# Patient Record
Sex: Female | Born: 1960
Health system: Southern US, Community
[De-identification: ages and names within clinical notes are randomized; demographics above are authoritative.]

## PROBLEM LIST (undated history)

## (undated) DIAGNOSIS — K8020 Calculus of gallbladder without cholecystitis without obstruction: Secondary | ICD-10-CM

## (undated) DIAGNOSIS — K909 Intestinal malabsorption, unspecified: Secondary | ICD-10-CM

## (undated) DIAGNOSIS — O2230 Deep phlebothrombosis in pregnancy, unspecified trimester: Secondary | ICD-10-CM

## (undated) DIAGNOSIS — F988 Other specified behavioral and emotional disorders with onset usually occurring in childhood and adolescence: Secondary | ICD-10-CM

## (undated) DIAGNOSIS — I2699 Other pulmonary embolism without acute cor pulmonale: Secondary | ICD-10-CM

## (undated) DIAGNOSIS — D649 Anemia, unspecified: Secondary | ICD-10-CM

## (undated) DIAGNOSIS — Z973 Presence of spectacles and contact lenses: Secondary | ICD-10-CM

## (undated) DIAGNOSIS — F419 Anxiety disorder, unspecified: Secondary | ICD-10-CM

## (undated) DIAGNOSIS — F329 Major depressive disorder, single episode, unspecified: Secondary | ICD-10-CM

## (undated) DIAGNOSIS — D509 Iron deficiency anemia, unspecified: Secondary | ICD-10-CM

## (undated) DIAGNOSIS — K219 Gastro-esophageal reflux disease without esophagitis: Secondary | ICD-10-CM

## (undated) DIAGNOSIS — E119 Type 2 diabetes mellitus without complications: Secondary | ICD-10-CM

## (undated) DIAGNOSIS — F32A Depression, unspecified: Secondary | ICD-10-CM

## (undated) HISTORY — DX: Intestinal malabsorption, unspecified: K90.9

## (undated) HISTORY — PX: TONSILLECTOMY: SUR1361

## (undated) HISTORY — DX: Iron deficiency anemia, unspecified: D50.9

## (undated) HISTORY — PX: ESOPHAGOGASTRODUODENOSCOPY: SHX1529

## (undated) HISTORY — PX: MOUTH SURGERY: SHX715

## (undated) HISTORY — PX: COLONOSCOPY: SHX174

---

## 2013-08-13 ENCOUNTER — Encounter (HOSPITAL_COMMUNITY): Payer: Self-pay | Admitting: Emergency Medicine

## 2013-08-13 ENCOUNTER — Emergency Department (HOSPITAL_COMMUNITY)
Admission: EM | Admit: 2013-08-13 | Discharge: 2013-08-13 | Disposition: A | Discharge status: Home / Self Care | Payer: Federal, State, Local not specified - PPO | Attending: Emergency Medicine | Admitting: Emergency Medicine

## 2013-08-13 ENCOUNTER — Emergency Department (HOSPITAL_COMMUNITY): Payer: Federal, State, Local not specified - PPO

## 2013-08-13 DIAGNOSIS — Z79899 Other long term (current) drug therapy: Secondary | ICD-10-CM | POA: Insufficient documentation

## 2013-08-13 DIAGNOSIS — E119 Type 2 diabetes mellitus without complications: Secondary | ICD-10-CM | POA: Insufficient documentation

## 2013-08-13 DIAGNOSIS — F329 Major depressive disorder, single episode, unspecified: Secondary | ICD-10-CM | POA: Insufficient documentation

## 2013-08-13 DIAGNOSIS — F988 Other specified behavioral and emotional disorders with onset usually occurring in childhood and adolescence: Secondary | ICD-10-CM | POA: Insufficient documentation

## 2013-08-13 DIAGNOSIS — Z86711 Personal history of pulmonary embolism: Secondary | ICD-10-CM | POA: Insufficient documentation

## 2013-08-13 DIAGNOSIS — F3289 Other specified depressive episodes: Secondary | ICD-10-CM | POA: Insufficient documentation

## 2013-08-13 DIAGNOSIS — D649 Anemia, unspecified: Secondary | ICD-10-CM

## 2013-08-13 DIAGNOSIS — R0602 Shortness of breath: Secondary | ICD-10-CM | POA: Diagnosis present

## 2013-08-13 HISTORY — DX: Other pulmonary embolism without acute cor pulmonale: I26.99

## 2013-08-13 HISTORY — DX: Other specified behavioral and emotional disorders with onset usually occurring in childhood and adolescence: F98.8

## 2013-08-13 HISTORY — DX: Anemia, unspecified: D64.9

## 2013-08-13 HISTORY — DX: Major depressive disorder, single episode, unspecified: F32.9

## 2013-08-13 HISTORY — DX: Depression, unspecified: F32.A

## 2013-08-13 HISTORY — DX: Type 2 diabetes mellitus without complications: E11.9

## 2013-08-13 LAB — COMPREHENSIVE METABOLIC PANEL
ALT: 49 U/L — ABNORMAL HIGH (ref 0–35)
AST: 30 U/L (ref 0–37)
Albumin: 3.8 g/dL (ref 3.5–5.2)
Alkaline Phosphatase: 143 U/L — ABNORMAL HIGH (ref 39–117)
BUN: 19 mg/dL (ref 6–23)
CO2: 24 mEq/L (ref 19–32)
Calcium: 9.3 mg/dL (ref 8.4–10.5)
Chloride: 98 mEq/L (ref 96–112)
Creatinine, Ser: 1.06 mg/dL (ref 0.50–1.10)
GFR calc Af Amer: 69 mL/min — ABNORMAL LOW (ref >90)
GFR calc non Af Amer: 60 mL/min — ABNORMAL LOW (ref >90)
Glucose, Bld: 158 mg/dL — ABNORMAL HIGH (ref 70–99)
Potassium: 3.7 mEq/L (ref 3.7–5.3)
Sodium: 137 mEq/L (ref 137–147)
Total Bilirubin: 0.2 mg/dL — ABNORMAL LOW (ref 0.3–1.2)
Total Protein: 8 g/dL (ref 6.0–8.3)

## 2013-08-13 LAB — TYPE AND SCREEN
ABO/RH(D): O POS
Antibody Screen: NEGATIVE

## 2013-08-13 LAB — PRO B NATRIURETIC PEPTIDE: Pro B Natriuretic peptide (BNP): 31.9 pg/mL (ref 0–125)

## 2013-08-13 LAB — CBC WITH DIFFERENTIAL/PLATELET
Basophils Absolute: 0.1 10*3/uL (ref 0.0–0.1)
Basophils Relative: 1 % (ref 0–1)
Eosinophils Absolute: 0.1 10*3/uL (ref 0.0–0.7)
Eosinophils Relative: 1 % (ref 0–5)
HCT: 29.4 % — ABNORMAL LOW (ref 36.0–46.0)
Hemoglobin: 8.3 g/dL — ABNORMAL LOW (ref 12.0–15.0)
Lymphocytes Relative: 26 % (ref 12–46)
Lymphs Abs: 2.9 10*3/uL (ref 0.7–4.0)
MCH: 18.1 pg — ABNORMAL LOW (ref 26.0–34.0)
MCHC: 28.2 g/dL — ABNORMAL LOW (ref 30.0–36.0)
MCV: 64.2 fL — ABNORMAL LOW (ref 78.0–100.0)
Monocytes Absolute: 0.8 10*3/uL (ref 0.1–1.0)
Monocytes Relative: 7 % (ref 3–12)
Neutro Abs: 7.2 10*3/uL (ref 1.7–7.7)
Neutrophils Relative %: 65 % (ref 43–77)
Platelets: 475 10*3/uL — ABNORMAL HIGH (ref 150–400)
RBC: 4.58 MIL/uL (ref 3.87–5.11)
RDW: 19.6 % — ABNORMAL HIGH (ref 11.5–15.5)
WBC: 11.1 10*3/uL — ABNORMAL HIGH (ref 4.0–10.5)

## 2013-08-13 LAB — ABO/RH: ABO/RH(D): O POS

## 2013-08-13 MED ORDER — SODIUM CHLORIDE 0.9 % IV BOLUS (SEPSIS)
500.0000 mL | Freq: Once | INTRAVENOUS | Status: AC
  Administered 2013-08-13: 500 mL via INTRAVENOUS

## 2013-08-13 NOTE — ED Notes (Signed)
Pt states she has chronic anemia and felt it dropping last month  Pt was seen at [facility] and had a CT which showed multiple PEs  Pt was treated for that and was discharged yesterday  Pt states today she has been dizzy and very short of breath  Pt states she has had blood transfusions and iron transfusions in the past  Pt states she thinks her hgb was 7.8 when she was in the hospital

## 2013-08-13 NOTE — Discharge Instructions (Signed)

## 2013-08-13 NOTE — ED Provider Notes (Signed)
CSN: [id number]     Arrival date & time 08/13/13  2002 History   First MD Initiated Contact with Patient 08/13/13 2039     Chief Complaint  Patient presents with  . Shortness of Breath     (Consider location/radiation/quality/duration/timing/severity/associated sxs/prior Treatment) Patient is a 53 y.o. female presenting with shortness of breath. The history is provided by the patient.  Shortness of Breath Severity:  Mild Onset quality:  Gradual Duration:  2 days Timing:  Constant Progression:  Unchanged Chronicity:  New Context: activity   Relieved by:  Rest Worsened by:  Activity Associated symptoms: no abdominal pain, no chest pain, no cough, no fever, no headaches, no neck pain and no vomiting     Past Medical History  Diagnosis Date  . Anemia   . Pulmonary emboli   . ADD (attention deficit disorder)   . Depression   . Diabetes mellitus without complication    Past Surgical History  Procedure Laterality Date  . Mouth surgery     Family History  Problem Relation Age of Onset  . CAD Father   . Hypertension Other   . Diabetes Other   . Stroke Other   . Cancer Other    History  Substance Use Topics  . Smoking status: Never Smoker   . Smokeless tobacco: Not on file  . Alcohol Use: Yes     Comment: rare   OB History   Grav Para Term Preterm Abortions TAB SAB Ect Mult Living                 Review of Systems  Constitutional: Negative for fever and fatigue.  HENT: Negative for congestion and drooling.   Eyes: Negative for pain.  Respiratory: Positive for shortness of breath. Negative for cough.   Cardiovascular: Negative for chest pain.  Gastrointestinal: Negative for nausea, vomiting, abdominal pain and diarrhea.  Genitourinary: Negative for dysuria and hematuria.  Musculoskeletal: Negative for back pain, gait problem and neck pain.  Skin: Negative for color change.  Neurological: Negative for dizziness and headaches.  Hematological: Negative for  adenopathy.  Psychiatric/Behavioral: Negative for behavioral problems.  All other systems reviewed and are negative.      Allergies  Review of patient's allergies indicates no known allergies.  Home Medications   Current Outpatient Rx  Name  Route  Sig  Dispense  Refill  . clonazePAM (KLONOPIN) 1 MG tablet   Oral   Take 1 mg by mouth at bedtime as needed for anxiety.         . furosemide (LASIX) 20 MG tablet   Oral   Take 20 mg by mouth 2 (two) times daily.         Marland Kitchen glucosamine-chondroitin 500-400 MG tablet   Oral   Take 1 tablet by mouth 2 (two) times daily.         Marland Kitchen lisdexamfetamine (VYVANSE) 70 MG capsule   Oral   Take 70 mg by mouth daily.         . Multiple Vitamin (MULTIVITAMIN WITH MINERALS) TABS tablet   Oral   Take 1 tablet by mouth daily.         . potassium chloride (K-DUR) 10 MEQ tablet   Oral   Take 10 mEq by mouth daily.         . Rivaroxaban (XARELTO) 15 MG TABS tablet   Oral   Take 15 mg by mouth 2 (two) times daily with a meal.         .  venlafaxine XR (EFFEXOR-XR) 150 MG 24 hr capsule   Oral   Take 150 mg by mouth daily with breakfast.          BP 146/91  Pulse 128  Resp 20  SpO2 99%  LMP 05/21/2013 Physical Exam  Nursing note and vitals reviewed. Constitutional: She is oriented to person, place, and time. She appears well-developed and well-nourished.  HENT:  Head: Normocephalic.  Mouth/Throat: Oropharynx is clear and moist. No oropharyngeal exudate.  Eyes: Conjunctivae and EOM are normal. Pupils are equal, round, and reactive to light.  Neck: Normal range of motion. Neck supple.  Cardiovascular: Regular rhythm, normal heart sounds and intact distal pulses.  Exam reveals no gallop and no friction rub.   No murmur heard. HR 101  Pulmonary/Chest: Effort normal and breath sounds normal. No respiratory distress. She has no wheezes.  Abdominal: Soft. Bowel sounds are normal. There is no tenderness. There is no rebound  and no guarding.  Musculoskeletal: Normal range of motion. She exhibits no edema and no tenderness.  Neurological: She is alert and oriented to person, place, and time.  Skin: Skin is warm and dry.  Psychiatric: She has a normal mood and affect. Her behavior is normal.    ED Course  Procedures (including critical care time) Labs Review Labs Reviewed  CBC WITH DIFFERENTIAL - Abnormal; Notable for the following:    WBC 11.1 (*)    Hemoglobin 8.3 (*)    HCT 29.4 (*)    MCV 64.2 (*)    MCH 18.1 (*)    MCHC 28.2 (*)    RDW 19.6 (*)    Platelets 475 (*)    All other components within normal limits  COMPREHENSIVE METABOLIC PANEL - Abnormal; Notable for the following:    Glucose, Bld 158 (*)    ALT 49 (*)    Alkaline Phosphatase 143 (*)    Total Bilirubin 0.2 (*)    GFR calc non Af Amer 60 (*)    GFR calc Af Amer 69 (*)    All other components within normal limits  PRO B NATRIURETIC PEPTIDE  PRO B NATRIURETIC PEPTIDE  TYPE AND SCREEN  ABO/RH   Imaging Review Dg Chest 2 View  08/13/2013   CLINICAL DATA:  Shortness of breath.  EXAM: CHEST  2 VIEW  COMPARISON:  No priors.  FINDINGS: Lung volumes are normal. No consolidative airspace disease. No pleural effusions. No evidence of pulmonary edema. Heart size is upper limits of normal. Upper mediastinal contours are unremarkable. Large hiatal hernia with air-fluid level noted.  IMPRESSION: 1. No radiographic evidence of acute cardiopulmonary disease. 2. Large hiatal hernia.   Electronically Signed   By: Vinnie Langton M.D.   On: 08/13/2013 21:40     EKG Interpretation   Date/Time:  Thursday August 13 2013 21:00:02 EDT Ventricular Rate:  98 PR Interval:  147 QRS Duration: 89 QT Interval:  369 QTC Calculation: 471 R Axis:   15 Text Interpretation:  Sinus rhythm Consider left atrial enlargement no  previous for comparison Confirmed by Brittaney Beaulieu  MD, [employer] ([id number]) on  08/13/2013 9:26:32 PM      MDM   Final diagnoses:  SOB  (shortness of breath)  Chronic anemia    8:54 PM 53 y.o. female w hx of anemia, recent dx of PE (d/c yesterday) on xarelto, DM who presents with shortness of breath with exertion. She notes that since her discharge she has had increased shortness of breath with exertion and also some  mild dizziness. She denies any chest pain. She is afebrile and mildly tachycardic here. Triage vitals show a heart rate of 128 but her heart rate on my exam is 101. She notes that she has a history of anemia and has required a blood transfusion once previously. She states that her hemoglobin upon discharge from [facility] was 7.8. She is concerned that she has become anemic and this is the reason for her shortness of breath. Will get screening lab work and chest x-ray. I suspect that her newly diagnosed PE is the cause of her shortness of breath. She has not missed her xarelto since d/c.   10:54 PM: Pt continues to appear well. No inc wob on exam, pt is 100% on RA, no cp. Hgb is 8.3 and other labs/imagnig non-contrib.  I have discussed the diagnosis/risks/treatment options with the patient and believe the pt to be eligible for discharge home to follow-up with her pcp next week. We also discussed returning to the ED immediately if new or worsening sx occur. We discussed the sx which are most concerning (e.g., cp, worsening sob, fever) that necessitate immediate return. Medications administered to the patient during their visit and any new prescriptions provided to the patient are listed below.  Medications given during this visit Medications  sodium chloride 0.9 % bolus 500 mL (500 mLs Intravenous New Bag/Given 08/13/13 2126)    New Prescriptions   No medications on file     Blanchard Kelch, MD 08/13/13 2259

## 2013-08-13 NOTE — ED Notes (Signed)
Patient is alert and oriented x3.  She was given DC instructions and follow up visit instructions.  Patient gave verbal understanding. She was DC ambulatory under her own power to home.  V/S stable.  He was not showing any signs of distress on DC 

## 2013-08-13 NOTE — Progress Notes (Signed)
   CARE MANAGEMENT ED NOTE 08/13/2013  Patient:  Samantha Maynard, Samantha Maynard   Account Number:  [id number]  Date Initiated:  08/13/2013  Documentation initiated by:  Livia Snellen  Subjective/Objective Assessment:   Patient presents to Ed with shortness of breath.     Subjective/Objective Assessment Detail:   Patient with pmhx of anemia, PE, and DM     Action/Plan:   Action/Plan Detail:   Anticipated DC Date:       Status Recommendation to Physician:   Result of Recommendation:    Other ED [place]  Other  PCP issues    Choice offered to / List presented to:            Status of service:  Completed, signed off  ED Comments:   ED Comments Detail:  Patient reports her pcp is at the [facility] at [facility].  Patient cannot recall the name of her physician.  System updated.

## 2013-08-21 ENCOUNTER — Other Ambulatory Visit (HOSPITAL_COMMUNITY): Payer: Self-pay

## 2013-08-27 ENCOUNTER — Encounter (HOSPITAL_COMMUNITY): Payer: Self-pay | Admitting: Internal Medicine

## 2013-09-18 ENCOUNTER — Other Ambulatory Visit: Payer: Self-pay | Admitting: Obstetrics and Gynecology

## 2013-09-18 ENCOUNTER — Other Ambulatory Visit (HOSPITAL_COMMUNITY)
Admission: RE | Admit: 2013-09-18 | Discharge: 2013-09-18 | Disposition: A | Discharge status: Home / Self Care | Payer: Federal, State, Local not specified - PPO | Source: Ambulatory Visit | Attending: Obstetrics and Gynecology | Admitting: Obstetrics and Gynecology

## 2013-09-18 DIAGNOSIS — Z1151 Encounter for screening for human papillomavirus (HPV): Secondary | ICD-10-CM | POA: Insufficient documentation

## 2013-09-18 DIAGNOSIS — Z01419 Encounter for gynecological examination (general) (routine) without abnormal findings: Secondary | ICD-10-CM | POA: Insufficient documentation

## 2013-09-30 ENCOUNTER — Telehealth: Payer: Self-pay | Admitting: Hematology & Oncology

## 2013-09-30 NOTE — Telephone Encounter (Signed)
I spoke w NEW PATIENT today to remind them of their appointment with Dr. Ennever. Also, advised them to bring all medication bottles and insurance card information. ° °

## 2013-10-02 ENCOUNTER — Ambulatory Visit: Payer: Federal, State, Local not specified - PPO

## 2013-10-02 ENCOUNTER — Ambulatory Visit: Payer: Federal, State, Local not specified - PPO | Admitting: Hematology & Oncology

## 2013-10-02 ENCOUNTER — Telehealth: Payer: Self-pay | Admitting: Hematology & Oncology

## 2013-10-02 ENCOUNTER — Other Ambulatory Visit: Payer: Federal, State, Local not specified - PPO | Admitting: Lab

## 2013-10-02 NOTE — Telephone Encounter (Signed)
Pt called said she went to [facility]. I had given her all the details of the appointment, she said she went out of town and her daughter cleaned the kitchen so she went to the wrong location. She rescheduled for 6-18. She said she would call back for the address etc.

## 2013-11-04 ENCOUNTER — Telehealth: Payer: Self-pay | Admitting: Hematology & Oncology

## 2013-11-04 NOTE — Telephone Encounter (Signed)
Left vm w NEW PATIENT today to remind them of their appointment with Dr. Ennever. Also, advised them to bring all medication bottles and insurance card information. ° °

## 2013-11-05 ENCOUNTER — Ambulatory Visit (HOSPITAL_BASED_OUTPATIENT_CLINIC_OR_DEPARTMENT_OTHER): Payer: Federal, State, Local not specified - PPO

## 2013-11-05 ENCOUNTER — Telehealth: Payer: Self-pay | Admitting: Hematology & Oncology

## 2013-11-05 ENCOUNTER — Other Ambulatory Visit (HOSPITAL_BASED_OUTPATIENT_CLINIC_OR_DEPARTMENT_OTHER): Payer: Federal, State, Local not specified - PPO | Admitting: Lab

## 2013-11-05 ENCOUNTER — Ambulatory Visit: Payer: Federal, State, Local not specified - PPO

## 2013-11-05 ENCOUNTER — Ambulatory Visit (HOSPITAL_BASED_OUTPATIENT_CLINIC_OR_DEPARTMENT_OTHER): Payer: Federal, State, Local not specified - PPO | Admitting: Hematology & Oncology

## 2013-11-05 ENCOUNTER — Encounter: Payer: Self-pay | Admitting: Hematology & Oncology

## 2013-11-05 VITALS — BP 144/82 | HR 91 | Temp 98.3°F | Resp 14 | Ht 63.0 in | Wt 217.0 lb

## 2013-11-05 DIAGNOSIS — K909 Intestinal malabsorption, unspecified: Secondary | ICD-10-CM | POA: Insufficient documentation

## 2013-11-05 DIAGNOSIS — I2699 Other pulmonary embolism without acute cor pulmonale: Secondary | ICD-10-CM

## 2013-11-05 DIAGNOSIS — D509 Iron deficiency anemia, unspecified: Secondary | ICD-10-CM

## 2013-11-05 HISTORY — DX: Intestinal malabsorption, unspecified: K90.9

## 2013-11-05 HISTORY — DX: Iron deficiency anemia, unspecified: D50.9

## 2013-11-05 LAB — IRON AND TIBC CHCC
%SAT: 4 % — ABNORMAL LOW (ref 21–57)
Iron: 15 ug/dL — ABNORMAL LOW (ref 41–142)
TIBC: 353 ug/dL (ref 236–444)
UIBC: 338 ug/dL (ref 120–384)

## 2013-11-05 LAB — CBC WITH DIFFERENTIAL (CANCER CENTER ONLY)
BASO#: 0 10*3/uL (ref 0.0–0.2)
BASO%: 0.2 % (ref 0.0–2.0)
EOS%: 0 % (ref 0.0–7.0)
Eosinophils Absolute: 0 10*3/uL (ref 0.0–0.5)
HCT: 33.3 % — ABNORMAL LOW (ref 34.8–46.6)
HGB: 9.8 g/dL — ABNORMAL LOW (ref 11.6–15.9)
LYMPH#: 1.7 10*3/uL (ref 0.9–3.3)
LYMPH%: 25.9 % (ref 14.0–48.0)
MCH: 20.2 pg — ABNORMAL LOW (ref 26.0–34.0)
MCHC: 29.4 g/dL — ABNORMAL LOW (ref 32.0–36.0)
MCV: 69 fL — ABNORMAL LOW (ref 81–101)
MONO#: 0.4 10*3/uL (ref 0.1–0.9)
MONO%: 6.8 % (ref 0.0–13.0)
NEUT#: 4.4 10*3/uL (ref 1.5–6.5)
NEUT%: 67.1 % (ref 39.6–80.0)
Platelets: 480 10*3/uL — ABNORMAL HIGH (ref 145–400)
RBC: 4.86 10*6/uL (ref 3.70–5.32)
RDW: 22.4 % — ABNORMAL HIGH (ref 11.1–15.7)
WBC: 6.5 10*3/uL (ref 3.9–10.0)

## 2013-11-05 LAB — TECHNOLOGIST REVIEW CHCC SATELLITE

## 2013-11-05 LAB — FERRITIN CHCC: Ferritin: <4 ng/ml — ABNORMAL LOW (ref 9–269)

## 2013-11-05 LAB — CHCC SATELLITE - SMEAR

## 2013-11-05 MED ORDER — SODIUM CHLORIDE 0.9 % IV SOLN
1020.0000 mg | Freq: Once | INTRAVENOUS | Status: AC
  Administered 2013-11-05: 1020 mg via INTRAVENOUS
  Filled 2013-11-05: qty 34

## 2013-11-05 MED ORDER — SODIUM CHLORIDE 0.9 % IV SOLN
Freq: Once | INTRAVENOUS | Status: AC
  Administered 2013-11-05: 12:00:00 via INTRAVENOUS

## 2013-11-05 NOTE — Telephone Encounter (Signed)
[employer] - NPR   [id number] PR NORMAL SALINE SOLUTION INFUS  [id number] PR FERUMOXYTOL, NON-ESRD  [id number] PR DRUGS UNCLASSIFIED INJECTION  Anemia, iron deficiency - Primary 280.9 Malabsorption of iron

## 2013-11-05 NOTE — Patient Instructions (Signed)

## 2013-11-06 ENCOUNTER — Telehealth: Payer: Self-pay | Admitting: *Deleted

## 2013-11-06 NOTE — Progress Notes (Signed)
Referral MD  Reason for Referral: Iron deficiency anemia                                    Recurrent pulmonary emboli   Chief Complaint  Patient presents with  . NEW PATIENT  : Iron is low  HPI: Samantha Maynard is a very charming 53 year old white female. 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 She has been feeling very tired. She just does not have a lot of energy.  She has a history of pulmonary emboli. She had an episode a couple years ago. She is on anticoagulation for a year. She then developed a second episode. This just happened spontaneously. She is on Xarelto and doing well. There's been no bleeding.  She has had blood work done. She was found to be quite anemic. Back in April, her hemoglobin was 8.1. MCV was 63. Her doctor felt that we needed to see her to help with her feeling better.  She's had no pain. She's had no nausea or vomiting. Oral iron is tough to take for her.  There's been no change in bowel or bladder habits. She has had colonoscopy and upper endoscopy. She has had her mammograms.    Past Medical History  Diagnosis Date  . Anemia   . Pulmonary emboli   . ADD (attention deficit disorder)   . Depression   . Diabetes mellitus without complication   . Anemia, iron deficiency 11/05/2013  . Malabsorption of iron 11/05/2013  :  Past Surgical History  Procedure Laterality Date  . Mouth surgery    :  Current outpatient prescriptions:clonazePAM (KLONOPIN) 1 MG tablet, Take 1 mg by mouth at bedtime as needed for anxiety., Disp: , Rfl: ;  furosemide (LASIX) 20 MG tablet, Take 20 mg by mouth daily. , Disp: , Rfl: ;  glucosamine-chondroitin 500-400 MG tablet, Take 1 tablet by mouth 2 (two) times daily., Disp: , Rfl: ;  Iron-Vitamin C (IRON 100/C PO), Take by mouth every morning., Disp: , Rfl:  lisdexamfetamine (VYVANSE) 70 MG capsule, Take 70 mg by mouth daily., Disp: , Rfl: ;  metFORMIN (GLUCOPHAGE-XR) 500 MG 24 hr tablet, Take 500 mg by mouth 2 (two) times daily., Disp: , Rfl: ;   Multiple Vitamin (MULTIVITAMIN WITH MINERALS) TABS tablet, Take 1 tablet by mouth daily., Disp: , Rfl: ;  potassium chloride (K-DUR) 10 MEQ tablet, Take 10 mEq by mouth daily., Disp: , Rfl:  rivaroxaban (XARELTO) 20 MG TABS tablet, Take 20 mg by mouth daily with supper., Disp: , Rfl: ;  venlafaxine XR (EFFEXOR-XR) 150 MG 24 hr capsule, Take 150 mg by mouth daily with breakfast., Disp: , Rfl: :  :  No Known Allergies:  Family History  Problem Relation Age of Onset  . CAD Father   . Hypertension Other   . Diabetes Other   . Stroke Other   . Cancer Other   :  History   Social History  . Marital Status: Married    Spouse Name: N/A    Number of Children: N/A  . Years of Education: N/A   Occupational History  . Not on file.   Social History Main Topics  . Smoking status: Never Smoker   . Smokeless tobacco: Never Used     Comment: never used tobacco  . Alcohol Use: Yes     Comment: rare  . Drug Use: No  . Sexual Activity: Not on  file   Other Topics Concern  . Not on file   Social History Narrative  . No narrative on file  :  Pertinent items are noted in HPI.  Exam: @IPVITALS @  well-developed and well-nourished white female in no obvious distress. Vital signs show a temperature of 98 3. Pulse 91. Blood pressure 144/82. Weight is 217 pounds. Head and neck exam shows no ocular or oral lesions. She has pale conjunctiva. There is no adenopathy in the neck. Lungs are clear. Cardiac exam regular in rhythm with no murmurs rubs or bruits. Abdomen is soft. Has good bowel sounds. There is no fluid wave. There is no palpable liver or spleen. Extremities shows no clubbing cyanosis or edema. Skin exam no rashes. Neurological exam is nonfocal.    Recent Labs  11/05/13 1010  WBC 6.5  HGB 9.8*  HCT 33.3*  PLT 480*   No results found for this basename: NA, K, CL, CO2, GLUCOSE, BUN, CREATININE, CALCIUM,  in the last 72 hours  Blood smear review: Microcytic red cells. There is some  anisocytosis. There are no target cells. I see no rouleau formation. There are no schistocytes. White cells per normal in morphology maturation. There are no immature myeloid cells. There are no hypersegmented polys. I see no atypical what the size. Platelets are increased in number. Platelets are small. Platelets are well granulated.  Pathology: No data     Assessment and Plan: Samantha Maynard is a very charming 53 year old white female. She has iron deficiency. She has a history of recurrent thromboembolic events. She is a lifelong anticoagulation.  Of note, there are studies showed ferritin less than 4. Her iron saturation is 4%. Total iron is only 15.  She is clearly iron deficient. We will go ahead and give her IV iron today. I'm sure that this will help.  I'm sure she is not absorbing well. There may be some GI bleeding. She is on anticoagulation but there has been no obvious melena or bright red blood per rectum.  We will plan to get her back to see [place] in about 4 weeks or so. I suspect that her hemoglobin will improve nicely. I also suspect that her MCV will start to come up in her platelets will go down.  I don't see any for a bone marrow test on her.  It was fun talking to her. 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 She did this while she was up in [place].

## 2013-11-06 NOTE — Telephone Encounter (Addendum)
Message copied by Lenn Sink on Fri Nov 06, 2013  9:33 AM ------      Message from: Burney Gauze R      Created: Thu Nov 05, 2013  7:10 PM       acall - iron is very low!!  i am glad you got the iron already!!  Hopefully it will make you feel better!!  pete ------Informed pt that iron was low and its good that she received iron yesterday. Hopefully she will start feeling better.

## 2013-11-10 LAB — HYPERCOAGULABLE PANEL, COMPREHENSIVE
AntiThromb III Func: 114 % (ref 76–126)
Anticardiolipin IgA: 11 APL U/mL (ref <22)
Anticardiolipin IgG: 9 GPL U/mL (ref <23)
Anticardiolipin IgM: 7 MPL U/mL (ref <11)
Beta-2 Glyco I IgG: 0 G Units (ref <20)
Beta-2-Glycoprotein I IgA: 16 A Units (ref <20)
Beta-2-Glycoprotein I IgM: 3 M Units (ref <20)
DRVVT 1:1 Mix: 41.6 secs (ref <42.9)
DRVVT: 49.9 secs — ABNORMAL HIGH (ref <42.9)
Lupus Anticoagulant: NOT DETECTED
PTT Lupus Anticoagulant: 39.5 secs (ref 28.0–43.0)
Protein C Activity: 176 % — ABNORMAL HIGH (ref 75–133)
Protein C, Total: 93 % (ref 72–160)
Protein S Activity: 128 % (ref 69–129)
Protein S Total: 99 % (ref 60–150)

## 2013-11-10 LAB — RETICULOCYTES (CHCC)
ABS Retic: 72.6 10*3/uL (ref 19.0–186.0)
RBC.: 4.84 MIL/uL (ref 3.87–5.11)
Retic Ct Pct: 1.5 % (ref 0.4–2.3)

## 2013-11-12 ENCOUNTER — Telehealth: Payer: Self-pay | Admitting: *Deleted

## 2013-11-12 NOTE — Telephone Encounter (Signed)
Message copied by Rico Ala on Thu Nov 12, 2013 10:50 AM ------      Message from: Burney Gauze R      Created: Wed Nov 11, 2013  6:38 AM       Call - all blood clotting studies are normal!!  pete ------

## 2013-11-12 NOTE — Telephone Encounter (Signed)
Called patient and left message on personal cell phone that her blood clotting studies were all normal per dr. Marin Olp

## 2013-12-10 ENCOUNTER — Ambulatory Visit (HOSPITAL_BASED_OUTPATIENT_CLINIC_OR_DEPARTMENT_OTHER): Payer: Federal, State, Local not specified - PPO | Admitting: Hematology & Oncology

## 2013-12-10 ENCOUNTER — Other Ambulatory Visit (HOSPITAL_BASED_OUTPATIENT_CLINIC_OR_DEPARTMENT_OTHER): Payer: Federal, State, Local not specified - PPO | Admitting: Lab

## 2013-12-10 VITALS — BP 139/105 | HR 90 | Temp 97.1°F | Resp 16

## 2013-12-10 DIAGNOSIS — D509 Iron deficiency anemia, unspecified: Secondary | ICD-10-CM

## 2013-12-10 DIAGNOSIS — I2699 Other pulmonary embolism without acute cor pulmonale: Secondary | ICD-10-CM

## 2013-12-10 DIAGNOSIS — K909 Intestinal malabsorption, unspecified: Secondary | ICD-10-CM

## 2013-12-10 LAB — CBC WITH DIFFERENTIAL (CANCER CENTER ONLY)
BASO#: 0 10*3/uL (ref 0.0–0.2)
BASO%: 0.2 % (ref 0.0–2.0)
EOS%: 0 % (ref 0.0–7.0)
Eosinophils Absolute: 0 10*3/uL (ref 0.0–0.5)
HCT: 43.3 % (ref 34.8–46.6)
HGB: 14.1 g/dL (ref 11.6–15.9)
LYMPH#: 2 10*3/uL (ref 0.9–3.3)
LYMPH%: 31.5 % (ref 14.0–48.0)
MCH: 25.3 pg — ABNORMAL LOW (ref 26.0–34.0)
MCHC: 32.6 g/dL (ref 32.0–36.0)
MCV: 78 fL — ABNORMAL LOW (ref 81–101)
MONO#: 0.5 10*3/uL (ref 0.1–0.9)
MONO%: 7.4 % (ref 0.0–13.0)
NEUT#: 3.8 10*3/uL (ref 1.5–6.5)
NEUT%: 60.9 % (ref 39.6–80.0)
Platelets: 298 10*3/uL (ref 145–400)
RBC: 5.58 10*6/uL — ABNORMAL HIGH (ref 3.70–5.32)
WBC: 6.2 10*3/uL (ref 3.9–10.0)

## 2013-12-10 LAB — RETICULOCYTES (CHCC)
ABS Retic: 28.2 10*3/uL (ref 19.0–186.0)
RBC.: 5.64 MIL/uL — ABNORMAL HIGH (ref 3.87–5.11)
Retic Ct Pct: 0.5 % (ref 0.4–2.3)

## 2013-12-10 LAB — CHCC SATELLITE - SMEAR

## 2013-12-11 ENCOUNTER — Other Ambulatory Visit: Payer: Self-pay | Admitting: *Deleted

## 2013-12-11 ENCOUNTER — Telehealth: Payer: Self-pay | Admitting: *Deleted

## 2013-12-11 DIAGNOSIS — D509 Iron deficiency anemia, unspecified: Secondary | ICD-10-CM

## 2013-12-11 LAB — IRON AND TIBC CHCC
%SAT: 19 % — ABNORMAL LOW (ref 21–57)
Iron: 55 ug/dL (ref 41–142)
TIBC: 285 ug/dL (ref 236–444)
UIBC: 230 ug/dL (ref 120–384)

## 2013-12-11 LAB — FERRITIN CHCC: Ferritin: 44 ng/ml (ref 9–269)

## 2013-12-11 NOTE — Progress Notes (Signed)
Hematology and Oncology Follow Up Visit  Samantha Maynard [id number] [birth year] 53 y.o. 12/11/2013   Principle Diagnosis:   Iron deficiency anemia  Recurrent pulmonary emboli  Current Therapy:   IV iron as indicated 100 patient received a dose of Feraheme in June of 2015     Interim History:  Ms.  Samantha Maynard is back for followup. First saw her back in June. She felt tired. She did not have a lot of energy. She is an oncology nurse.   her ferritin was less than 4. Her iron saturation was only 4%.  She has not had any bleeding. She's had no nausea vomiting.  She got herIV iron. She is feeling better. She is getting ready to to go on vacation down to [place]. She feels ready for vacation now.  She is on Xarelto and doing well.    Medications: Current outpatient prescriptions:clonazePAM (KLONOPIN) 1 MG tablet, Take 1 mg by mouth at bedtime as needed for anxiety., Disp: , Rfl: ;  furosemide (LASIX) 20 MG tablet, Take 20 mg by mouth daily. , Disp: , Rfl: ;  glucosamine-chondroitin 500-400 MG tablet, Take 1 tablet by mouth 2 (two) times daily., Disp: , Rfl: ;  Iron-Vitamin C (IRON 100/C PO), Take by mouth every morning., Disp: , Rfl:  lisdexamfetamine (VYVANSE) 70 MG capsule, Take 70 mg by mouth daily., Disp: , Rfl: ;  metFORMIN (GLUCOPHAGE-XR) 500 MG 24 hr tablet, Take 500 mg by mouth 2 (two) times daily., Disp: , Rfl: ;  Multiple Vitamin (MULTIVITAMIN WITH MINERALS) TABS tablet, Take 1 tablet by mouth daily., Disp: , Rfl: ;  potassium chloride (K-DUR) 10 MEQ tablet, Take 10 mEq by mouth daily., Disp: , Rfl:  rivaroxaban (XARELTO) 20 MG TABS tablet, Take 20 mg by mouth daily with supper., Disp: , Rfl: ;  senna (SENOKOT) 8.6 MG TABS tablet, Take 2 tablets by mouth at bedtime., Disp: , Rfl: ;  venlafaxine XR (EFFEXOR-XR) 150 MG 24 hr capsule, Take 150 mg by mouth daily with breakfast., Disp: , Rfl:   Allergies: No Known Allergies  Past Medical History, Surgical history, Social history, and Family  History were reviewed and updated.  Review of Systems: As above  Physical Exam:  oral temperature is 97.1 F (36.2 C). Her blood pressure is 139/105 and her pulse is 90. Her respiration is 16.   Well-developed and well-nourished white female. Head and neck exam shows no ocular or oral lesions. There are no palpable cervical or supraclavicular lymph nodes. Lungs are clear bilaterally. She has no rales wheezes or rhonchi. Cardiac exam regular in with him with no murmurs rubs or bruits. Abdomen is soft. She has good bowel sounds. There is no fluid wave is no palpable liver or spleen tip. Back exam shows no tenderness over the spine ribs or hips. Extremities shows no clubbing cyanosis or edema. Skin exam no rashes, ecchymosis or petechia.  Lab Results  Component Value Date   WBC 6.2 12/10/2013   HGB 14.1 12/10/2013   HCT 43.3 12/10/2013   MCV 78* 12/10/2013   PLT 298 12/10/2013     Chemistry      Component Value Date/Time   NA 137 08/13/2013 2120   K 3.7 08/13/2013 2120   CL 98 08/13/2013 2120   CO2 24 08/13/2013 2120   BUN 19 08/13/2013 2120   CREATININE 1.06 08/13/2013 2120      Component Value Date/Time   CALCIUM 9.3 08/13/2013 2120   ALKPHOS 143* 08/13/2013 2120   AST 30 08/13/2013  2120   ALT 49* 08/13/2013 2120   BILITOT 0.2* 08/13/2013 2120         Impression and Plan: [name] is 53 year old white female with iron deficiency anemia. She had marked iron deficiency. After one dose of iron, her hemoglobin has gone up almost 5 point. Her MCV is better. We will see what her iron studies show. Even though she is not anemic, she may need another dose of iron just to replete her iron stores.  I think we can probably get her back in about 2 months time now.  I am is happy that she is able to function better and have a better quality of life.  Samantha Napoleon, MD 7/24/20157:10 AM

## 2013-12-11 NOTE — Telephone Encounter (Signed)
Informed pt that Dr Marin Olp reviewed her labs today and stated that she needs another dose of 1020 IV iron in the next week or two.

## 2013-12-15 ENCOUNTER — Ambulatory Visit (HOSPITAL_BASED_OUTPATIENT_CLINIC_OR_DEPARTMENT_OTHER): Payer: Federal, State, Local not specified - PPO

## 2013-12-15 VITALS — BP 117/85 | HR 88 | Temp 96.9°F | Resp 20

## 2013-12-15 DIAGNOSIS — D509 Iron deficiency anemia, unspecified: Secondary | ICD-10-CM

## 2013-12-15 MED ORDER — SODIUM CHLORIDE 0.9 % IV SOLN
1020.0000 mg | Freq: Once | INTRAVENOUS | Status: AC
  Administered 2013-12-15: 1020 mg via INTRAVENOUS
  Filled 2013-12-15: qty 34

## 2013-12-15 MED ORDER — SODIUM CHLORIDE 0.9 % IV SOLN
INTRAVENOUS | Status: DC
  Administered 2013-12-15: 12:00:00 via INTRAVENOUS

## 2013-12-15 NOTE — Patient Instructions (Signed)

## 2014-01-21 ENCOUNTER — Ambulatory Visit (HOSPITAL_BASED_OUTPATIENT_CLINIC_OR_DEPARTMENT_OTHER): Payer: Federal, State, Local not specified - PPO | Admitting: Family

## 2014-01-21 ENCOUNTER — Encounter: Payer: Self-pay | Admitting: Family

## 2014-01-21 ENCOUNTER — Other Ambulatory Visit (HOSPITAL_BASED_OUTPATIENT_CLINIC_OR_DEPARTMENT_OTHER): Payer: Federal, State, Local not specified - PPO | Admitting: Lab

## 2014-01-21 VITALS — BP 151/92 | HR 80 | Temp 97.9°F | Resp 14 | Ht 63.0 in | Wt 198.0 lb

## 2014-01-21 DIAGNOSIS — D509 Iron deficiency anemia, unspecified: Secondary | ICD-10-CM

## 2014-01-21 DIAGNOSIS — K909 Intestinal malabsorption, unspecified: Secondary | ICD-10-CM

## 2014-01-21 LAB — RETICULOCYTES (CHCC)
ABS Retic: 50.6 10*3/uL (ref 19.0–186.0)
RBC.: 5.62 MIL/uL — ABNORMAL HIGH (ref 3.87–5.11)
Retic Ct Pct: 0.9 % (ref 0.4–2.3)

## 2014-01-21 LAB — CBC WITH DIFFERENTIAL (CANCER CENTER ONLY)
BASO#: 0 10*3/uL (ref 0.0–0.2)
BASO%: 0.1 % (ref 0.0–2.0)
EOS%: 0 % (ref 0.0–7.0)
Eosinophils Absolute: 0 10*3/uL (ref 0.0–0.5)
HCT: 46.9 % — ABNORMAL HIGH (ref 34.8–46.6)
HGB: 15.6 g/dL (ref 11.6–15.9)
LYMPH#: 2.4 10*3/uL (ref 0.9–3.3)
LYMPH%: 29.9 % (ref 14.0–48.0)
MCH: 27.5 pg (ref 26.0–34.0)
MCHC: 33.3 g/dL (ref 32.0–36.0)
MCV: 83 fL (ref 81–101)
MONO#: 0.5 10*3/uL (ref 0.1–0.9)
MONO%: 6.4 % (ref 0.0–13.0)
NEUT#: 5.1 10*3/uL (ref 1.5–6.5)
NEUT%: 63.6 % (ref 39.6–80.0)
Platelets: 298 10*3/uL (ref 145–400)
RBC: 5.68 10*6/uL — ABNORMAL HIGH (ref 3.70–5.32)
RDW: 24.9 % — ABNORMAL HIGH (ref 11.1–15.7)
WBC: 8 10*3/uL (ref 3.9–10.0)

## 2014-01-21 LAB — IRON AND TIBC CHCC
%SAT: 24 % (ref 21–57)
Iron: 59 ug/dL (ref 41–142)
TIBC: 246 ug/dL (ref 236–444)
UIBC: 187 ug/dL (ref 120–384)

## 2014-01-21 LAB — FERRITIN CHCC: Ferritin: 203 ng/ml (ref 9–269)

## 2014-01-21 NOTE — Progress Notes (Signed)
[place]  [phone] [phone]  ID: Samantha Maynard OB: 1960-11-09 MR#: [id number]#:[id number] Patient Care Team: Wenda Low, MD as PCP - General (Internal Medicine)  DIAGNOSIS: Iron deficiency anemia  Recurrent pulmonary emboli  INTERVAL HISTORY: Samantha Maynard is here today for her follow-up. She is doing much better and her energy level is great. She has responded nicely to the Iron infusion she had in June. In July, her iron sat was 19% and her ferritin was 44. She had a great time in [place] sight seeing. She has had no bleeding or pain. She denies fever, chills, n/v, cough, rash, headache, dizziness, SOB, chest pain, palpitations, abdominal pain, constipation, diarrhea, blood in urine or stool. She has had no tenderness, numbness or tingling in her extremities. She has chronic swelling of her left leg and wears a compressions stocking and good shoes for this. Her appetite is good and she is drinking plenty of fluids. She is doing well with her Xarelto. Overall, she is doing very well.   CURRENT TREATMENT: IV iron as indicated 100 patient received a dose of Feraheme in June of 2015 Life-long Xarelto (this is managed by her PCP)  REVIEW OF SYSTEMS: All other 10 point review of systems is negative.   PAST MEDICAL HISTORY: Past Medical History  Diagnosis Date  . Anemia   . Pulmonary emboli   . ADD (attention deficit disorder)   . Depression   . Diabetes mellitus without complication   . Anemia, iron deficiency 11/05/2013  . Malabsorption of iron 11/05/2013   PAST SURGICAL HISTORY: Past Surgical History  Procedure Laterality Date  . Mouth surgery     FAMILY HISTORY Family History  Problem Relation Age of Onset  . CAD Father   . Hypertension Other   . Diabetes Other   . Stroke Other   . Cancer Other    GYNECOLOGIC HISTORY:  No LMP recorded.   SOCIAL HISTORY:  History   Social History  . Marital Status: Married    Spouse Name: N/A     Number of Children: N/A  . Years of Education: N/A   Occupational History  . Not on file.   Social History Main Topics  . Smoking status: Never Smoker   . Smokeless tobacco: Never Used     Comment: never used tobacco  . Alcohol Use: Yes     Comment: rare  . Drug Use: No  . Sexual Activity: Not on file   Other Topics Concern  . Not on file   Social History Narrative  . No narrative on file   ADVANCED DIRECTIVES: <no information>  HEALTH MAINTENANCE: History  Substance Use Topics  . Smoking status: Never Smoker   . Smokeless tobacco: Never Used     Comment: never used tobacco  . Alcohol Use: Yes     Comment: rare   Colonoscopy: PAP: Bone density: Lipid panel:  No Known Allergies  Current Outpatient Prescriptions  Medication Sig Dispense Refill  . clonazePAM (KLONOPIN) 1 MG tablet Take 1 mg by mouth at bedtime as needed for anxiety.      . furosemide (LASIX) 20 MG tablet Take 20 mg by mouth daily.       Marland Kitchen glucosamine-chondroitin 500-400 MG tablet Take 1 tablet by mouth 2 (two) times daily.      . Iron-Vitamin C (IRON 100/C PO) Take by mouth every morning.      . lisdexamfetamine (VYVANSE) 70 MG capsule Take 70 mg by mouth daily.      Marland Kitchen  metFORMIN (GLUCOPHAGE-XR) 500 MG 24 hr tablet Take 500 mg by mouth 2 (two) times daily.      . Multiple Vitamin (MULTIVITAMIN WITH MINERALS) TABS tablet Take 1 tablet by mouth daily.      . potassium chloride (K-DUR) 10 MEQ tablet Take 10 mEq by mouth daily.      . rivaroxaban (XARELTO) 20 MG TABS tablet Take 20 mg by mouth daily with supper.      . senna (SENOKOT) 8.6 MG TABS tablet Take 2 tablets by mouth at bedtime.      Marland Kitchen venlafaxine XR (EFFEXOR-XR) 150 MG 24 hr capsule Take 150 mg by mouth daily with breakfast.       No current facility-administered medications for this visit.   OBJECTIVE: Filed Vitals:   01/21/14 1139  BP: 151/92  Pulse: 80  Temp: 97.9 F (36.6 C)  Resp: 14   Body mass index is 35.08 kg/(m^2). ECOG  FS:0 - Asymptomatic Ocular: Sclerae unicteric, pupils equal, round and reactive to light Ear-nose-throat: Oropharynx clear, dentition fair Lymphatic: No cervical or supraclavicular adenopathy Lungs no rales or rhonchi, good excursion bilaterally Heart regular rate and rhythm, no murmur appreciated Abd soft, nontender, positive bowel sounds MSK no focal spinal tenderness, no joint edema Neuro: non-focal, well-oriented, appropriate affect Breasts: Deferred  LAB RESULTS: CMP     Component Value Date/Time   NA 137 08/13/2013 2120   K 3.7 08/13/2013 2120   CL 98 08/13/2013 2120   CO2 24 08/13/2013 2120   GLUCOSE 158* 08/13/2013 2120   BUN 19 08/13/2013 2120   CREATININE 1.06 08/13/2013 2120   CALCIUM 9.3 08/13/2013 2120   PROT 8.0 08/13/2013 2120   ALBUMIN 3.8 08/13/2013 2120   AST 30 08/13/2013 2120   ALT 49* 08/13/2013 2120   ALKPHOS 143* 08/13/2013 2120   BILITOT 0.2* 08/13/2013 2120   GFRNONAA 60* 08/13/2013 2120   GFRAA 69* 08/13/2013 2120   No results found for this basename: SPEP, UPEP,  kappa and lambda light chains   Lab Results  Component Value Date   WBC 8.0 01/21/2014   NEUTROABS 5.1 01/21/2014   HGB 15.6 01/21/2014   HCT 46.9* 01/21/2014   MCV 83 01/21/2014   PLT 298 01/21/2014   No results found for this basename: LABCA2   No components found with this basename: LABCA125   No results found for this basename: INR,  in the last 168 hours Urinalysis No results found for this basename: colorurine, appearanceur, labspec, phurine, glucoseu, hgbur, bilirubinur, ketonesur, proteinur, urobilinogen, nitrite, leukocytesur   STUDIES: No results found.  ASSESSMENT/PLAN: Samantha Maynard is 53 year old white female with iron deficiency anemia.  She had one dose of iron in June and has responded very nicely to it. She is not anemic. She is completely asymptomatic at this time.  Her CBC was good today. We will wait and see what her iron studies show to determine if she will need another infusion.  We  will see her back in 2 months for labs and follow-up.  She is in agreement with this and knows to call here with any questions or concerns. We can certainly see her sooner if need be.   Eliezer Bottom, NP 01/21/2014 12:01 PM

## 2014-02-26 ENCOUNTER — Other Ambulatory Visit: Payer: Self-pay

## 2014-02-26 DIAGNOSIS — Z1231 Encounter for screening mammogram for malignant neoplasm of breast: Secondary | ICD-10-CM

## 2014-03-10 ENCOUNTER — Ambulatory Visit
Admission: RE | Admit: 2014-03-10 | Discharge: 2014-03-10 | Disposition: A | Discharge status: Home / Self Care | Payer: Federal, State, Local not specified - PPO | Source: Ambulatory Visit

## 2014-03-10 DIAGNOSIS — Z1231 Encounter for screening mammogram for malignant neoplasm of breast: Secondary | ICD-10-CM

## 2014-03-18 ENCOUNTER — Other Ambulatory Visit: Payer: Self-pay | Admitting: Obstetrics and Gynecology

## 2014-03-18 DIAGNOSIS — R928 Other abnormal and inconclusive findings on diagnostic imaging of breast: Secondary | ICD-10-CM

## 2014-03-25 ENCOUNTER — Other Ambulatory Visit (HOSPITAL_BASED_OUTPATIENT_CLINIC_OR_DEPARTMENT_OTHER): Payer: Federal, State, Local not specified - PPO | Admitting: Lab

## 2014-03-25 ENCOUNTER — Encounter: Payer: Self-pay | Admitting: Hematology & Oncology

## 2014-03-25 ENCOUNTER — Ambulatory Visit (HOSPITAL_BASED_OUTPATIENT_CLINIC_OR_DEPARTMENT_OTHER): Payer: Federal, State, Local not specified - PPO | Admitting: Hematology & Oncology

## 2014-03-25 VITALS — BP 149/88 | HR 81 | Temp 98.2°F | Resp 14 | Ht 63.0 in | Wt 199.0 lb

## 2014-03-25 DIAGNOSIS — D509 Iron deficiency anemia, unspecified: Secondary | ICD-10-CM

## 2014-03-25 LAB — CBC WITH DIFFERENTIAL (CANCER CENTER ONLY)
BASO#: 0 10*3/uL (ref 0.0–0.2)
BASO%: 0.1 % (ref 0.0–2.0)
EOS%: 0 % (ref 0.0–7.0)
Eosinophils Absolute: 0 10*3/uL (ref 0.0–0.5)
HCT: 44.4 % (ref 34.8–46.6)
HGB: 15.1 g/dL (ref 11.6–15.9)
LYMPH#: 2.2 10*3/uL (ref 0.9–3.3)
LYMPH%: 30.6 % (ref 14.0–48.0)
MCH: 29.9 pg (ref 26.0–34.0)
MCHC: 34 g/dL (ref 32.0–36.0)
MCV: 88 fL (ref 81–101)
MONO#: 0.5 10*3/uL (ref 0.1–0.9)
MONO%: 6.9 % (ref 0.0–13.0)
NEUT#: 4.6 10*3/uL (ref 1.5–6.5)
NEUT%: 62.4 % (ref 39.6–80.0)
Platelets: 260 10*3/uL (ref 145–400)
RBC: 5.05 10*6/uL (ref 3.70–5.32)
RDW: 13.7 % (ref 11.1–15.7)
WBC: 7.3 10*3/uL (ref 3.9–10.0)

## 2014-03-25 LAB — IRON AND TIBC CHCC
%SAT: 35 % (ref 21–57)
Iron: 74 ug/dL (ref 41–142)
TIBC: 214 ug/dL — ABNORMAL LOW (ref 236–444)
UIBC: 140 ug/dL (ref 120–384)

## 2014-03-25 LAB — RETICULOCYTES (CHCC)
ABS Retic: 60.6 10*3/uL (ref 19.0–186.0)
RBC.: 5.05 MIL/uL (ref 3.87–5.11)
Retic Ct Pct: 1.2 % (ref 0.4–2.3)

## 2014-03-25 LAB — FERRITIN CHCC: Ferritin: 75 ng/ml (ref 9–269)

## 2014-03-25 NOTE — Progress Notes (Signed)
Hematology and Oncology Follow Up Visit  Samantha Maynard [id number] 01-16-1961 53 y.o. 03/25/2014   Principle Diagnosis:   Iron deficiency anemia  Recurrent pulmonary emboli  Current Therapy:   IV iron as indicated 100 patient received a dose of Feraheme in  July of 2015     Interim History:  [name] is back for followup. First saw her back in June. She felt tired. The IV iron has helped her quite a bit. She feels better.  In September, her ferritin was 203 with an iron saturation of 24%. She has not had any bleeding. She's had no nausea or vomiting.  I'm just glad that she feels better. She is an oncology nurse. She is able to function a little bit more. She is on Xarelto and doing well.  Overall, her performance status is ECOG 1   Medications: Current outpatient prescriptions: clonazePAM (KLONOPIN) 1 MG tablet, Take 1 mg by mouth at bedtime as needed for anxiety., Disp: , Rfl: ;  furosemide (LASIX) 20 MG tablet, Take 20 mg by mouth as needed. , Disp: , Rfl: ;  glucosamine-chondroitin 500-400 MG tablet, Take 1 tablet by mouth 2 (two) times daily., Disp: , Rfl: ;  Iron-Vitamin C (IRON 100/C PO), Take by mouth every morning., Disp: , Rfl:  lisdexamfetamine (VYVANSE) 70 MG capsule, Take 70 mg by mouth daily., Disp: , Rfl: ;  metFORMIN (GLUCOPHAGE-XR) 500 MG 24 hr tablet, Take 500 mg by mouth 2 (two) times daily., Disp: , Rfl: ;  Multiple Vitamin (MULTIVITAMIN WITH MINERALS) TABS tablet, Take 1 tablet by mouth daily., Disp: , Rfl: ;  potassium chloride (K-DUR) 10 MEQ tablet, Take 10 mEq by mouth as needed. , Disp: , Rfl:  rivaroxaban (XARELTO) 20 MG TABS tablet, Take 20 mg by mouth daily with supper., Disp: , Rfl: ;  senna (SENOKOT) 8.6 MG TABS tablet, Take 2 tablets by mouth at bedtime., Disp: , Rfl: ;  venlafaxine XR (EFFEXOR-XR) 150 MG 24 hr capsule, Take 150 mg by mouth daily with breakfast., Disp: , Rfl:   Allergies: No Known Allergies  Past Medical History, Surgical history, Social  history, and Family History were reviewed and updated.  Review of Systems: As above  Physical Exam:  height is 5\' 3"  (1.6 m) and weight is 199 lb (90.266 kg). Her oral temperature is 98.2 F (36.8 C). Her blood pressure is 149/88 and her pulse is 81. Her respiration is 14.   Well-developed and well-nourished white female. Head and neck exam shows no ocular or oral lesions. There are no palpable cervical or supraclavicular lymph nodes. Lungs are clear bilaterally. She has no rales wheezes or rhonchi. Cardiac exam regular rate and rhythm with no murmurs rubs or bruits. Abdomen is soft. She has good bowel sounds. There is no fluid wave is no palpable liver or spleen tip. Back exam shows no tenderness over the spine ribs or hips. Extremities shows no clubbing cyanosis or edema. Skin exam no rashes, ecchymosis or petechia.neurological exam is nonfocal.  Lab Results  Component Value Date   WBC 7.3 03/25/2014   HGB 15.1 03/25/2014   HCT 44.4 03/25/2014   MCV 88 03/25/2014   PLT 260 03/25/2014     Chemistry      Component Value Date/Time   NA 137 08/13/2013 2120   K 3.7 08/13/2013 2120   CL 98 08/13/2013 2120   CO2 24 08/13/2013 2120   BUN 19 08/13/2013 2120   CREATININE 1.06 08/13/2013 2120  Component Value Date/Time   CALCIUM 9.3 08/13/2013 2120   ALKPHOS 143* 08/13/2013 2120   AST 30 08/13/2013 2120   ALT 49* 08/13/2013 2120   BILITOT 0.2* 08/13/2013 2120      Ferritin is 75. Iron saturation is 35%.   Impression and Plan: Samantha Maynard is 53 year old white female with iron deficiency anemia. She had marked iron deficiency. She has improved nicely. Her hemoglobin has stayed up in a normal level. Her MCV is 88 which is wonderful.  Her iron studies today show that her ferritin is coming down a little bit. Her iron saturation is still okay.   I think we can probably get her back in about  3 months time now.  I am is happy that she is able to function better and have a better  quality of life.  Volanda Napoleon, MD 11/5/20156:16 PM

## 2014-03-26 ENCOUNTER — Ambulatory Visit
Admission: RE | Admit: 2014-03-26 | Discharge: 2014-03-26 | Disposition: A | Discharge status: Home / Self Care | Payer: Federal, State, Local not specified - PPO | Source: Ambulatory Visit | Attending: Obstetrics and Gynecology | Admitting: Obstetrics and Gynecology

## 2014-03-26 DIAGNOSIS — R928 Other abnormal and inconclusive findings on diagnostic imaging of breast: Secondary | ICD-10-CM

## 2014-07-15 ENCOUNTER — Other Ambulatory Visit: Payer: Self-pay | Admitting: Family

## 2014-07-28 ENCOUNTER — Encounter: Payer: Self-pay | Admitting: Family

## 2014-07-28 ENCOUNTER — Other Ambulatory Visit (HOSPITAL_BASED_OUTPATIENT_CLINIC_OR_DEPARTMENT_OTHER): Payer: Federal, State, Local not specified - PPO | Admitting: Lab

## 2014-07-28 ENCOUNTER — Ambulatory Visit (HOSPITAL_BASED_OUTPATIENT_CLINIC_OR_DEPARTMENT_OTHER): Payer: Federal, State, Local not specified - PPO | Admitting: Family

## 2014-07-28 VITALS — BP 142/88 | HR 83 | Temp 97.5°F | Resp 14 | Ht 63.0 in | Wt 202.0 lb

## 2014-07-28 DIAGNOSIS — Z7901 Long term (current) use of anticoagulants: Secondary | ICD-10-CM

## 2014-07-28 DIAGNOSIS — Z86711 Personal history of pulmonary embolism: Secondary | ICD-10-CM

## 2014-07-28 DIAGNOSIS — K909 Intestinal malabsorption, unspecified: Secondary | ICD-10-CM

## 2014-07-28 DIAGNOSIS — D509 Iron deficiency anemia, unspecified: Secondary | ICD-10-CM

## 2014-07-28 LAB — CBC WITH DIFFERENTIAL (CANCER CENTER ONLY)
BASO#: 0 10*3/uL (ref 0.0–0.2)
BASO%: 0.2 % (ref 0.0–2.0)
EOS%: 0 % (ref 0.0–7.0)
Eosinophils Absolute: 0 10*3/uL (ref 0.0–0.5)
HCT: 45.4 % (ref 34.8–46.6)
HGB: 14.9 g/dL (ref 11.6–15.9)
LYMPH#: 1.6 10*3/uL (ref 0.9–3.3)
LYMPH%: 26.9 % (ref 14.0–48.0)
MCH: 29.2 pg (ref 26.0–34.0)
MCHC: 32.8 g/dL (ref 32.0–36.0)
MCV: 89 fL (ref 81–101)
MONO#: 0.5 10*3/uL (ref 0.1–0.9)
MONO%: 8.7 % (ref 0.0–13.0)
NEUT#: 3.8 10*3/uL (ref 1.5–6.5)
NEUT%: 64.2 % (ref 39.6–80.0)
Platelets: 286 10*3/uL (ref 145–400)
RBC: 5.1 10*6/uL (ref 3.70–5.32)
RDW: 13.6 % (ref 11.1–15.7)
WBC: 5.8 10*3/uL (ref 3.9–10.0)

## 2014-07-28 LAB — IRON AND TIBC CHCC
%SAT: 17 % — ABNORMAL LOW (ref 21–57)
Iron: 59 ug/dL (ref 41–142)
TIBC: 345 ug/dL (ref 236–444)
UIBC: 286 ug/dL (ref 120–384)

## 2014-07-28 LAB — FERRITIN CHCC: Ferritin: 17 ng/ml (ref 9–269)

## 2014-07-28 NOTE — Progress Notes (Signed)
Hematology and Oncology Follow Up Visit  Samantha Maynard [id number] 02-09-1961 54 y.o. 07/28/2014   Principle Diagnosis:  1. Iron deficiency anemia  2. Recurrent pulmonary emboli  Current Therapy:   IV iron as indicated 100 patient received a dose of Feraheme in June of 2015 Life-long Xarelto (this is managed by her PCP)    Interim History: Samantha Maynard is here today for her follow-up. She is feeling better. She had what she thinks was a gallbladder attack recently after eating too much avocado. She was to an ED with [facility] and her work up was negative. She is planning to follow-up with her PCP.  She has responded nicely to the Iron infusion she had in June 2015. In November, her iron sat was 35% and her ferritin was 75. She denies fatigue, fever, chills, n/v, cough, rash, headache, dizziness, SOB, chest pain, palpitations, abdominal pain, constipation, diarrhea, blood in urine or stool.  No tenderness, numbness or tingling in her extremities. She has chronic swelling of her left leg and wears a compressions stocking and good shoes for this. No new aches or pains. Her appetite is good and she is drinking plenty of fluids. Her weight is stable/ She is doing well with her Xarelto. No episodes of bleeding.  Medications:    Medication List       This list is accurate as of: 07/28/14 10:43 AM.  Always use your most recent med list.               clonazePAM 1 MG tablet  Commonly known as:  KLONOPIN  Take 1 mg by mouth at bedtime as needed for anxiety.     furosemide 20 MG tablet  Commonly known as:  LASIX  Take 20 mg by mouth as needed.     glucosamine-chondroitin 500-400 MG tablet  Take 1 tablet by mouth 2 (two) times daily.     IRON 100/C PO  Take by mouth every morning.     lisdexamfetamine 70 MG capsule  Commonly known as:  VYVANSE  Take 70 mg by mouth daily.     metFORMIN 500 MG 24 hr tablet  Commonly known as:  GLUCOPHAGE-XR  Take 500 mg by mouth 2 (two) times daily.     multivitamin with minerals Tabs tablet  Take 1 tablet by mouth daily.     potassium chloride 10 MEQ tablet  Commonly known as:  K-DUR  Take 10 mEq by mouth as needed.     senna 8.6 MG Tabs tablet  Commonly known as:  SENOKOT  Take 2 tablets by mouth at bedtime.     venlafaxine XR 150 MG 24 hr capsule  Commonly known as:  EFFEXOR-XR  Take 150 mg by mouth daily with breakfast.     XARELTO 20 MG Tabs tablet  Generic drug:  rivaroxaban  Take 20 mg by mouth daily with supper.        Allergies: No Known Allergies  Past Medical History, Surgical history, Social history, and Family History were reviewed and updated.  Review of Systems: All other 10 point review of systems is negative.   Physical Exam:  height is 5\' 3"  (1.6 m) and weight is 202 lb (91.627 kg). Her oral temperature is 97.5 F (36.4 C). Her blood pressure is 142/88 and her pulse is 83. Her respiration is 14.   Wt Readings from Last 3 Encounters:  07/28/14 202 lb (91.627 kg)  03/25/14 199 lb (90.266 kg)  01/21/14 198 lb (89.812 kg)  Ocular: Sclerae unicteric, pupils equal, round and reactive to light Ear-nose-throat: Oropharynx clear, dentition fair Lymphatic: No cervical or supraclavicular adenopathy Lungs no rales or rhonchi, good excursion bilaterally Heart regular rate and rhythm, no murmur appreciated Abd soft, nontender, positive bowel sounds MSK no focal spinal tenderness, no joint edema Neuro: non-focal, well-oriented, appropriate affect Breasts: Deferred, last mammogram in October, benign cyst of the left breast was found at the 6 o'clock position.   Lab Results  Component Value Date   WBC 5.8 07/28/2014   HGB 14.9 07/28/2014   HCT 45.4 07/28/2014   MCV 89 07/28/2014   PLT 286 07/28/2014   Lab Results  Component Value Date   FERRITIN 75 03/25/2014   IRON 74 03/25/2014   TIBC 214* 03/25/2014   UIBC 140 03/25/2014   IRONPCTSAT 35 03/25/2014   Lab Results  Component Value Date    RETICCTPCT 1.2 03/25/2014   RBC 5.10 07/28/2014   RETICCTABS 60.6 03/25/2014   No results found for: KPAFRELGTCHN, LAMBDASER, KAPLAMBRATIO No results found for: IGGSERUM, IGA, IGMSERUM No results found for: Odetta Pink, SPEI   Chemistry      Component Value Date/Time   NA 137 08/13/2013 2120   K 3.7 08/13/2013 2120   CL 98 08/13/2013 2120   CO2 24 08/13/2013 2120   BUN 19 08/13/2013 2120   CREATININE 1.06 08/13/2013 2120      Component Value Date/Time   CALCIUM 9.3 08/13/2013 2120   ALKPHOS 143* 08/13/2013 2120   AST 30 08/13/2013 2120   ALT 49* 08/13/2013 2120   BILITOT 0.2* 08/13/2013 2120     Impression and Plan: Samantha Maynard is 54 year old white female with iron deficiency anemia. She had one dose of iron in June 2015 and has responded very nicely to it. She is not anemic and is completely asymptomatic at this time.  Her CBC today was normal. We will see what her iron studies show to determine if she will need another infusion.  We will see her back in 3 months for labs and follow-up.  She is in agreement with this and knows to call here with any questions or concerns and to go to the ED in the event of an emrgency. We can certainly see her sooner if need be.   Eliezer Bottom, NP 3/9/201610:43 AM

## 2014-07-30 ENCOUNTER — Ambulatory Visit
Admission: RE | Admit: 2014-07-30 | Discharge: 2014-07-30 | Disposition: A | Discharge status: Home / Self Care | Payer: Federal, State, Local not specified - PPO | Source: Ambulatory Visit | Attending: Internal Medicine | Admitting: Internal Medicine

## 2014-07-30 ENCOUNTER — Other Ambulatory Visit: Payer: Self-pay | Admitting: Internal Medicine

## 2014-07-30 DIAGNOSIS — R1013 Epigastric pain: Secondary | ICD-10-CM

## 2014-08-17 ENCOUNTER — Telehealth: Payer: Self-pay | Admitting: Oncology

## 2014-08-17 ENCOUNTER — Other Ambulatory Visit: Payer: Self-pay | Admitting: Oncology

## 2014-08-17 ENCOUNTER — Telehealth: Payer: Self-pay | Admitting: Hematology & Oncology

## 2014-08-17 DIAGNOSIS — D509 Iron deficiency anemia, unspecified: Secondary | ICD-10-CM

## 2014-08-17 NOTE — Telephone Encounter (Addendum)
-----   Message from Volanda Napoleon, MD sent at 08/16/2014  4:06 PM EDT ----- Call - her iron studies are actually low!!  She might benefit from a dose of Feraeme 510mg  in 1-2 weeks.  Pete  Left voicemail message, put orders in signed and held, left message for Basile.

## 2014-08-17 NOTE — Telephone Encounter (Signed)
[facility] called to request medical records for a medical necessity claim for a [facility] 11/02/2013. Faxed info to:  [place] REVIEW CLAIM: [id number] ID: [id number] MEMBER: Samantha Maynard FAX: [phone]   CPTs: [id number] & [phone]

## 2014-08-18 ENCOUNTER — Telehealth: Payer: Self-pay | Admitting: Hematology & Oncology

## 2014-08-18 NOTE — Telephone Encounter (Signed)
Pt aware of 4-1 iron sent Baxter Flattery in basket to precert

## 2014-08-20 ENCOUNTER — Ambulatory Visit (HOSPITAL_BASED_OUTPATIENT_CLINIC_OR_DEPARTMENT_OTHER): Payer: Federal, State, Local not specified - PPO

## 2014-08-20 VITALS — BP 132/84 | HR 80 | Temp 98.4°F | Resp 18

## 2014-08-20 DIAGNOSIS — D509 Iron deficiency anemia, unspecified: Secondary | ICD-10-CM

## 2014-08-20 MED ORDER — SODIUM CHLORIDE 0.9 % IV SOLN
510.0000 mg | Freq: Once | INTRAVENOUS | Status: AC
  Administered 2014-08-20: 510 mg via INTRAVENOUS
  Filled 2014-08-20: qty 17

## 2014-08-20 MED ORDER — SODIUM CHLORIDE 0.9 % IV SOLN
Freq: Once | INTRAVENOUS | Status: AC
  Administered 2014-08-20: 13:00:00 via INTRAVENOUS

## 2014-08-20 NOTE — Patient Instructions (Signed)

## 2014-08-25 ENCOUNTER — Other Ambulatory Visit: Payer: Self-pay | Admitting: General Surgery

## 2014-09-03 ENCOUNTER — Encounter (HOSPITAL_COMMUNITY): Payer: Self-pay | Admitting: *Deleted

## 2014-09-03 ENCOUNTER — Telehealth (HOSPITAL_COMMUNITY): Payer: Self-pay | Admitting: Radiology

## 2014-09-03 ENCOUNTER — Emergency Department (HOSPITAL_COMMUNITY)
Admission: EM | Admit: 2014-09-03 | Discharge: 2014-09-03 | Disposition: A | Discharge status: Home / Self Care | Payer: Federal, State, Local not specified - PPO | Attending: Emergency Medicine | Admitting: Emergency Medicine

## 2014-09-03 ENCOUNTER — Emergency Department (HOSPITAL_COMMUNITY): Payer: Federal, State, Local not specified - PPO

## 2014-09-03 DIAGNOSIS — Z79899 Other long term (current) drug therapy: Secondary | ICD-10-CM | POA: Insufficient documentation

## 2014-09-03 DIAGNOSIS — F909 Attention-deficit hyperactivity disorder, unspecified type: Secondary | ICD-10-CM | POA: Insufficient documentation

## 2014-09-03 DIAGNOSIS — Z86711 Personal history of pulmonary embolism: Secondary | ICD-10-CM | POA: Insufficient documentation

## 2014-09-03 DIAGNOSIS — K8020 Calculus of gallbladder without cholecystitis without obstruction: Secondary | ICD-10-CM | POA: Insufficient documentation

## 2014-09-03 DIAGNOSIS — R1011 Right upper quadrant pain: Secondary | ICD-10-CM | POA: Diagnosis present

## 2014-09-03 DIAGNOSIS — E119 Type 2 diabetes mellitus without complications: Secondary | ICD-10-CM | POA: Diagnosis not present

## 2014-09-03 DIAGNOSIS — D509 Iron deficiency anemia, unspecified: Secondary | ICD-10-CM | POA: Insufficient documentation

## 2014-09-03 DIAGNOSIS — F329 Major depressive disorder, single episode, unspecified: Secondary | ICD-10-CM | POA: Diagnosis not present

## 2014-09-03 DIAGNOSIS — Z7901 Long term (current) use of anticoagulants: Secondary | ICD-10-CM | POA: Insufficient documentation

## 2014-09-03 LAB — COMPREHENSIVE METABOLIC PANEL
ALT: 31 U/L (ref 0–35)
AST: 52 U/L — ABNORMAL HIGH (ref 0–37)
Albumin: 4.3 g/dL (ref 3.5–5.2)
Alkaline Phosphatase: 146 U/L — ABNORMAL HIGH (ref 39–117)
Anion gap: 8 (ref 5–15)
BUN: 16 mg/dL (ref 6–23)
CO2: 29 mmol/L (ref 19–32)
Calcium: 9.2 mg/dL (ref 8.4–10.5)
Chloride: 103 mmol/L (ref 96–112)
Creatinine, Ser: 0.86 mg/dL (ref 0.50–1.10)
GFR calc Af Amer: 88 mL/min — ABNORMAL LOW (ref >90)
GFR calc non Af Amer: 76 mL/min — ABNORMAL LOW (ref >90)
Glucose, Bld: 125 mg/dL — ABNORMAL HIGH (ref 70–99)
Potassium: 4.4 mmol/L (ref 3.5–5.1)
Sodium: 140 mmol/L (ref 135–145)
Total Bilirubin: 0.5 mg/dL (ref 0.3–1.2)
Total Protein: 8.2 g/dL (ref 6.0–8.3)

## 2014-09-03 LAB — URINALYSIS, ROUTINE W REFLEX MICROSCOPIC
Bilirubin Urine: NEGATIVE
Glucose, UA: NEGATIVE mg/dL
Hgb urine dipstick: NEGATIVE
Ketones, ur: NEGATIVE mg/dL
Nitrite: NEGATIVE
Protein, ur: NEGATIVE mg/dL
Specific Gravity, Urine: 1.02 (ref 1.005–1.030)
Urobilinogen, UA: 1 mg/dL (ref 0.0–1.0)
pH: 8 (ref 5.0–8.0)

## 2014-09-03 LAB — CBC WITH DIFFERENTIAL/PLATELET
Basophils Absolute: 0 10*3/uL (ref 0.0–0.1)
Basophils Relative: 0 % (ref 0–1)
Eosinophils Absolute: 0 10*3/uL (ref 0.0–0.7)
Eosinophils Relative: 0 % (ref 0–5)
HCT: 44.6 % (ref 36.0–46.0)
Hemoglobin: 14.9 g/dL (ref 12.0–15.0)
Lymphocytes Relative: 23 % (ref 12–46)
Lymphs Abs: 2.5 10*3/uL (ref 0.7–4.0)
MCH: 29.9 pg (ref 26.0–34.0)
MCHC: 33.4 g/dL (ref 30.0–36.0)
MCV: 89.4 fL (ref 78.0–100.0)
Monocytes Absolute: 0.6 10*3/uL (ref 0.1–1.0)
Monocytes Relative: 6 % (ref 3–12)
Neutro Abs: 7.8 10*3/uL — ABNORMAL HIGH (ref 1.7–7.7)
Neutrophils Relative %: 71 % (ref 43–77)
Platelets: 317 10*3/uL (ref 150–400)
RBC: 4.99 MIL/uL (ref 3.87–5.11)
RDW: 15.5 % (ref 11.5–15.5)
WBC: 10.9 10*3/uL — ABNORMAL HIGH (ref 4.0–10.5)

## 2014-09-03 LAB — URINE MICROSCOPIC-ADD ON

## 2014-09-03 LAB — LIPASE, BLOOD: Lipase: 22 U/L (ref 11–59)

## 2014-09-03 MED ORDER — OXYCODONE-ACETAMINOPHEN 5-325 MG PO TABS: 2.0000 | ORAL_TABLET | ORAL | Status: DC | PRN

## 2014-09-03 NOTE — Discharge Instructions (Signed)
Cholelithiasis Take tylenol or percocet as needed for pain. Follow up with your general surgeon physician, Dr. Redmond Pulling. Return for fever, RUQ pain.  Cholelithiasis (also called gallstones) is a form of gallbladder disease in which gallstones form in your gallbladder. The gallbladder is an organ that stores bile made in the liver, which helps digest fats. Gallstones begin as small crystals and slowly grow into stones. Gallstone pain occurs when the gallbladder spasms and a gallstone is blocking the duct. Pain can also occur when a stone passes out of the duct.  RISK FACTORS  Being female.   Having multiple pregnancies. Health care providers sometimes advise removing diseased gallbladders before future pregnancies.   Being obese.  Eating a diet heavy in fried foods and fat.   Being older than 49 years and increasing age.   Prolonged use of medicines containing female hormones.   Having diabetes mellitus.   Rapidly losing weight.   Having a family history of gallstones (heredity).  SYMPTOMS  Nausea.   Vomiting.  Abdominal pain.   Yellowing of the skin (jaundice).   Sudden pain. It may persist from several minutes to several hours.  Fever.   Tenderness to the touch. In some cases, when gallstones do not move into the bile duct, people have no pain or symptoms. These are called "silent" gallstones.  TREATMENT Silent gallstones do not need treatment. In severe cases, emergency surgery may be required. Options for treatment include:  Surgery to remove the gallbladder. This is the most common treatment.  Medicines. These do not always work and may take 6-12 months or more to work.  Shock wave treatment (extracorporeal biliary lithotripsy). In this treatment an ultrasound machine sends shock waves to the gallbladder to break gallstones into smaller pieces that can pass into the intestines or be dissolved by medicine. HOME CARE INSTRUCTIONS   Only take  over-the-counter or prescription medicines for pain, discomfort, or fever as directed by your health care provider.   Follow a low-fat diet until seen again by your health care provider. Fat causes the gallbladder to contract, which can result in pain.   Follow up with your health care provider as directed. Attacks are almost always recurrent and surgery is usually required for permanent treatment.  SEEK IMMEDIATE MEDICAL CARE IF:   Your pain increases and is not controlled by medicines.   You have a fever or persistent symptoms for more than 2-3 days.   You have a fever and your symptoms suddenly get worse.   You have persistent nausea and vomiting.  MAKE SURE YOU:   Understand these instructions.  Will watch your condition.  Will get help right away if you are not doing well or get worse. Document Released: 05/03/2005 Document Revised: 01/07/2013 Document Reviewed: 10/29/2012 [facility] Patient Information 2015 [place], [place]. This information is not intended to replace advice given to you by your health care provider. Make sure you discuss any questions you have with your health care provider.

## 2014-09-03 NOTE — ED Notes (Signed)
Pt states she cannot give urine sample at this time.

## 2014-09-03 NOTE — ED Notes (Signed)
Pt complains of epigastric pain radiating to her right and left flanks. Pt states she has confirmed gallstones that were shown on ultrasound in March. Pt states she has mild nausea, denies vomiting, diarrhea, fever.

## 2014-09-03 NOTE — ED Provider Notes (Signed)
CSN: [id number]     Arrival date & time 09/03/14  1418 History   First MD Initiated Contact with Patient 09/03/14 1512     Chief Complaint  Patient presents with  . Abdominal Pain     (Consider location/radiation/quality/duration/timing/severity/associated sxs/prior Treatment) Patient is a 54 y.o. female presenting with abdominal pain. The history is provided by the patient. No language interpreter was used.  Abdominal Pain  [name] is a 54 y.o white female with a recent diagnosis of cholelithiasis, PE, and iron deficiency anemia who presents for RUQ pain that began after eating potato chips at 12:30 this afternoon.  She states it was a sharp 8/10 pain when it began but has since eased off and is now a 2/10.  The pain waxes and wanes and feels like a pressure now. The pain radiates around to the back. Nothing makes it better or worse.  She has not taken anything for pain. She denies any fever, chest pain, nausea, vomiting, or diarrhea.  Past Medical History  Diagnosis Date  . Anemia   . Pulmonary emboli   . ADD (attention deficit disorder)   . Depression   . Diabetes mellitus without complication   . Anemia, iron deficiency 11/05/2013  . Malabsorption of iron 11/05/2013   Past Surgical History  Procedure Laterality Date  . Mouth surgery     Family History  Problem Relation Age of Onset  . CAD Father   . Hypertension Other   . Diabetes Other   . Stroke Other   . Cancer Other    History  Substance Use Topics  . Smoking status: Never Smoker   . Smokeless tobacco: Never Used     Comment: never used tobacco  . Alcohol Use: 0.0 oz/week    0 Standard drinks or equivalent per week     Comment: rare   OB History    No data available     Review of Systems  Gastrointestinal: Positive for abdominal pain.  Skin: Negative for rash.      Allergies  Review of patient's allergies indicates no known allergies.  Home Medications   Prior to Admission medications   Medication  Sig Start Date End Date Taking? Authorizing Provider  clonazePAM (KLONOPIN) 1 MG tablet Take 1 mg by mouth at bedtime as needed for anxiety.   Yes Historical Provider, MD  glucosamine-chondroitin 500-400 MG tablet Take 1 tablet by mouth 2 (two) times daily.   Yes Historical Provider, MD  Iron-Vitamin C (IRON 100/C PO) Take by mouth every morning.   Yes Historical Provider, MD  lisdexamfetamine (VYVANSE) 70 MG capsule Take 70 mg by mouth daily.   Yes Historical Provider, MD  metFORMIN (GLUCOPHAGE) 500 MG tablet Take 500 mg by mouth 2 (two) times daily with a meal.  08/25/14  Yes Historical Provider, MD  Multiple Vitamin (MULTIVITAMIN WITH MINERALS) TABS tablet Take 1 tablet by mouth daily.   Yes Historical Provider, MD  rivaroxaban (XARELTO) 20 MG TABS tablet Take 20 mg by mouth daily with supper.   Yes Historical Provider, MD  senna (SENOKOT) 8.6 MG TABS tablet Take 2 tablets by mouth at bedtime.   Yes Historical Provider, MD  venlafaxine XR (EFFEXOR-XR) 150 MG 24 hr capsule Take 150 mg by mouth daily with breakfast.   Yes Historical Provider, MD  oxyCODONE-acetaminophen (PERCOCET/ROXICET) 5-325 MG per tablet Take 2 tablets by mouth every 4 (four) hours as needed for severe pain. 09/03/14   Amjad Fikes Patel-Mills, PA-C   BP 161/98 mmHg  Pulse 85  Temp(Src) 98.1 F (36.7 C) (Oral)  Resp 12  SpO2 100%  LMP 06/04/2014 Physical Exam  Constitutional: She is oriented to person, place, and time. She appears well-developed and well-nourished.  Cardiovascular: Normal rate, regular rhythm and normal heart sounds.   Pulmonary/Chest: Effort normal and breath sounds normal.  Abdominal: Soft. There is no tenderness. There is no CVA tenderness.  Obese. No reproducible RUQ tenderness.  Neurological: She is alert and oriented to person, place, and time.  Skin: Skin is warm and dry.  Nursing note and vitals reviewed.   ED Course  Procedures (including critical care time) Labs Review Labs Reviewed  CBC WITH  DIFFERENTIAL/PLATELET - Abnormal; Notable for the following:    WBC 10.9 (*)    Neutro Abs 7.8 (*)    All other components within normal limits  COMPREHENSIVE METABOLIC PANEL - Abnormal; Notable for the following:    Glucose, Bld 125 (*)    AST 52 (*)    Alkaline Phosphatase 146 (*)    GFR calc non Af Amer 76 (*)    GFR calc Af Amer 88 (*)    All other components within normal limits  LIPASE, BLOOD  URINALYSIS, ROUTINE W REFLEX MICROSCOPIC    Imaging Review US Abdomen Limited Ruq  09/03/2014   CLINICAL DATA:  Right upper quadrant pain since 1 p.m.  EXAM: US ABDOMEN LIMITED - RIGHT UPPER QUADRANT  COMPARISON:  None.  FINDINGS: Gallbladder:  Multiple layering gallstones measuring up to 11 mm. No gallbladder wall thickening or pericholecystic fluid. Negative sonographic Murphy's sign.  Common bile duct:  Diameter: 3 mm  Liver:  Hyperechoic hepatic parenchyma, suggesting hepatic steatosis. No focal hepatic lesion is seen.  IMPRESSION: Cholelithiasis, without associated sonographic findings to suggest acute cholecystitis.  Hepatic steatosis.   Electronically Signed   By: Julian Hy M.D.   On: 09/03/2014 16:20     EKG Interpretation   Date/Time:  Friday September 03 2014 14:29:21 EDT Ventricular Rate:  86 PR Interval:  146 QRS Duration: 90 QT Interval:  378 QTC Calculation: 452 R Axis:   -4 Text Interpretation:  Sinus rhythm Consider left atrial enlargement Low  voltage, precordial leads Consider anterior infarct No significant change  was found Confirmed by Wyvonnia Dusky  MD, STEPHEN [phone]) on 09/03/2014 2:38:35  PM      MDM   Final diagnoses:  RUQ abdominal pain  Calculus of gallbladder without cholecystitis without obstruction   Patient recently diagnosed with cholelithiasis on March 4th.  She was follow up by her pcp who did an ultrasound and referred her to general surgery.  She saw Dr. Redmond Pulling last week and was supposed to be scheduled for surgery today in order to have it next  week. She comes in for RUQ pain after eating today but pain has since gone down and she is feeling much better. She did not want anything for pain and states she only takes tylenol. She states this is similar to her previous cholecystitis pain. I do not think this is cardiac in nature. [place] of gallbladder shows multiple gallstones measuring up to 44mm but no wall thickening or pericholecystic fluid. CBD is 50mm.  No acute cholecystitis.   I have given her follow up with her general surgery physician, Dr. Redmond Pulling. She will schedule surgery on Monday as planned.  I gave her percocet for pain. She agrees to return for fever or worsening pain.       Ottie Glazier, PA-C 09/03/14 1639  Benjie Karvonen  Mingo Amber, MD 09/04/14 3832

## 2014-09-03 NOTE — ED Notes (Signed)
Pt states she's waiting for the surgeon to call her to schedule to have her gallbladder taken out, states thinks she is passing a gallbladder stone, denies n/v/d.

## 2014-09-08 ENCOUNTER — Ambulatory Visit: Payer: Self-pay | Admitting: General Surgery

## 2014-09-08 NOTE — H&P (Signed)
Samantha Maynard 08/25/2014 8:49 AM Location: [facility] Patient #: [id number] DOB: [birth year] Married / Language: English / Race: White Female  History of Present Illness Randall Hiss M. Theoplis Garciagarcia MD; 08/25/2014 9:37 AM) Patient words: gallbladder.  The patient is a 54 year old female who presents for evaluation of gall stones. She is referred by Dr Lysle Rubens for evaluation of epigastric pain. She states about a month ago after eating dinner she had acute onset of sharp epigastric pain radiating under both ribs to her back. She initially thought she was having a heart attack. She went to [facility]. A heart attack was ruled out with enzymes and EKG. Labs were essentially normal except for mild elevation of her AST and ALT of 45 and 43 respectively. She underwent a CT scan which demonstrated mild intrahepatic ductal dilatation with some mild pericholecystic fluid and a hiatal hernia. She was discharged. She followed up with her primary care physician who ordered an ultrasound of her abdomen which showed multiple gallstones without any pericholecystic fluid. Her common bile duct was normal. She has not had any additional tacks since then. She did have nausea with that first episode. She denies any weight loss. She denies any jaundice. She denies any NSAID use. She denies any trouble swallowing foods. 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 She had bilateral PEs diagnosed in March 2015. She was hospitalized to [facility]. An echocardiogram from March 2015 showed normal wall motion, normal ejection fraction, mild grade 1 diastolic dysfunction. She sees Dr. Marin Olp for iron deficiency anemia   Other Problems Gayland Curry, MD; 08/25/2014 9:38 AM) Back Pain Cholelithiasis Depression Gastroesophageal Reflux Disease Inguinal Hernia Other disease, cancer, significant illness Transfusion history SYMPTOMATIC  CHOLELITHIASIS (574.20  K80.20) ANTICOAGULATED BY ANTICOAGULATION TREATMENT (V58.61  Z79.01) Pulmonary Embolism / Blood Clot in Legs Diabetes Mellitus  Past Surgical History Marjean Donna, CMA; 08/25/2014 8:49 AM) Tonsillectomy  Diagnostic Studies History Marjean Donna, CMA; 08/25/2014 8:49 AM) Colonoscopy 1-5 years ago Mammogram within last year Pap Smear 1-5 years ago  Allergies ([place], CMA; 08/25/2014 8:50 AM) No Known Drug Allergies04/10/2014  Medication History (Sonya Bynum, CMA; 08/25/2014 8:51 AM) Vyvanse (70MG  Capsule, Oral) Active. ClonazePAM (1MG  Tablet, Oral) Active. MetFORMIN HCl (500MG  Tablet, Oral) Active. Venlafaxine HCl ER (150MG  Capsule ER 24HR, Oral) Active. Xarelto (20MG  Tablet, Oral) Active. Medications Reconciled  Social History Marjean Donna, CMA; 08/25/2014 8:49 AM) Alcohol use Occasional alcohol use. Caffeine use Coffee, Tea. No drug use Tobacco use Never smoker.  Family History Marjean Donna, Golden; 08/25/2014 8:49 AM) Arthritis Mother. Depression Mother. Heart Disease Father. Hypertension Mother, Sister. Melanoma Father.  Pregnancy / Birth History Marjean Donna, [place]; 08/25/2014 8:49 AM) Age at menarche 40 years. Age of menopause 8-55 Gravida 2 Irregular periods Maternal age 12-25 Para 2  Review of Systems ([place]; 08/25/2014 8:49 AM) General Present- Fatigue. Not Present- Appetite Loss, Chills, Fever, Night Sweats, Weight Gain and Weight Loss. Skin Not Present- Change in Wart/Mole, Dryness, Hives, Jaundice, New Lesions, Non-Healing Wounds, Rash and Ulcer. HEENT Present- Seasonal Allergies and Wears glasses/contact lenses. Not Present- Earache, Hearing Loss, Hoarseness, Nose Bleed, Oral Ulcers, Ringing in the Ears, Sinus Pain, Sore Throat, Visual Disturbances and Yellow Eyes. Respiratory Present- Snoring. Not Present- Bloody sputum, Chronic Cough, Difficulty Breathing and Wheezing. Breast Not Present- Breast Mass,  Breast Pain, Nipple Discharge and Skin Changes. Cardiovascular Present- Swelling of Extremities. Not Present- Chest Pain, Difficulty Breathing Lying Down,  Leg Cramps, Palpitations, Rapid Heart Rate and Shortness of Breath. Gastrointestinal Present- Bloating and Constipation. Not Present- Abdominal Pain, Bloody Stool, Change in Bowel Habits, Chronic diarrhea, Difficulty Swallowing, Excessive gas, Gets full quickly at meals, Hemorrhoids, Indigestion, Nausea, Rectal Pain and Vomiting. Female Genitourinary Not Present- Frequency, Nocturia, Painful Urination, Pelvic Pain and Urgency. Musculoskeletal Present- Joint Stiffness. Not Present- Back Pain, Joint Pain, Muscle Pain, Muscle Weakness and Swelling of Extremities. Neurological Not Present- Decreased Memory, Fainting, Headaches, Numbness, Seizures, Tingling, Tremor, Trouble walking and Weakness. Psychiatric Present- Depression. Not Present- Anxiety, Bipolar, Change in Sleep Pattern, Fearful and Frequent crying. Endocrine Present- Hot flashes and New Diabetes. Not Present- Cold Intolerance, Excessive Hunger, Hair Changes and Heat Intolerance. Hematology Not Present- Easy Bruising, Excessive bleeding, Gland problems, HIV and Persistent Infections.   Vitals (Sonya Bynum CMA; 08/25/2014 8:50 AM) 08/25/2014 8:50 AM Weight: 203 lb Height: 62in Body Surface Area: 2.01 m Body Mass Index: 37.13 kg/m Temp.: 97.37F(Temporal)  Pulse: 88 (Regular)  BP: 126/78 (Sitting, Left Arm, Standard)    Physical Exam Randall Hiss M. Conda Wannamaker MD; 08/25/2014 9:34 AM) General Mental Status-Alert. General Appearance-Consistent with stated age. Hydration-Well hydrated. Voice-Normal.  Head and Neck Head-normocephalic, atraumatic with no lesions or palpable masses. Trachea-midline. Thyroid Gland Characteristics - normal size and consistency.  Eye Eyeball - Bilateral-Extraocular movements intact. Sclera/Conjunctiva - Bilateral-No scleral  icterus.  Chest and Lung Exam Chest and lung exam reveals -quiet, even and easy respiratory effort with no use of accessory muscles and on auscultation, normal breath sounds, no adventitious sounds and normal vocal resonance. Inspection Chest Wall - Normal. Back - normal.  Breast - Did not examine.  Cardiovascular Cardiovascular examination reveals -normal heart sounds, regular rate and rhythm with no murmurs and normal pedal pulses bilaterally.  Abdomen Inspection Inspection of the abdomen reveals - No Hernias. Skin - Scar - no surgical scars. Palpation/Percussion Palpation and Percussion of the abdomen reveal - Soft, Non Tender, No Rebound tenderness, No Rigidity (guarding) and No hepatosplenomegaly. Auscultation Auscultation of the abdomen reveals - Bowel sounds normal.  Peripheral Vascular Upper Extremity Palpation - Pulses bilaterally normal.  Neurologic Neurologic evaluation reveals -alert and oriented x 3 with no impairment of recent or remote memory. Mental Status-Normal.  Neuropsychiatric The patient's mood and affect are described as -normal. Judgment and Insight-insight is appropriate concerning matters relevant to self.  Musculoskeletal Normal Exam - Left-Upper Extremity Strength Normal and Lower Extremity Strength Normal. Normal Exam - Right-Upper Extremity Strength Normal and Lower Extremity Strength Normal.  Lymphatic Head & Neck  General Head & Neck Lymphatics: Bilateral - Description - Normal. Axillary - Did not examine. Femoral & Inguinal - Did not examine.    Assessment & Plan Randall Hiss M. Marilou Barnfield MD; 08/25/2014 9:38 AM) SYMPTOMATIC CHOLELITHIASIS (574.20  K80.20) Impression: I do think her symptoms were due to gallbladder disease given the mild elevation of her transaminases as well as the CT finding that night at [facility]. Current Plans  I believe the patient's symptoms are consistent with gallbladder disease.  We discussed  gallbladder disease. The patient was given Neurosurgeon. We discussed non-operative and operative management. We discussed the signs & symptoms of acute cholecystitis  I discussed laparoscopic cholecystectomy with IOC in detail. The patient was given educational material as well as diagrams detailing the procedure. We discussed the risks and benefits of a laparoscopic cholecystectomy including, but not limited to bleeding, infection, injury to surrounding structures such as the intestine or liver, bile leak, retained gallstones, need to convert to an open procedure,  prolonged diarrhea, blood clots such as DVT, common bile duct injury, anesthesia risks, and possible need for additional procedures. We discussed the typical post-operative recovery course. I explained that the likelihood of improvement of their symptoms is good.  The patient has elected to proceed with surgery.  Our office will contact you to schedule surgery once we received clearance from your primary care physician's office Schedule for Surgery Provided with written educational materials HISTORY OF PULMONARY EMBOLISM (V12.55  Z86.711) Impression: Because of her history of multiple PEs and swell as DVTs and family history of blood clots I explained that she is at higher risk for bleeding as well as perioperative blood clots. I advised her that we would need to contact her primary care physician to get perioperative recommendations regarding her anticoagulation. She will most likely need a Lovenox bridge around the time of surgery while stopping the xarelto several days prior to surgery. I advised her that our office will contact her to schedule surgery once we received perioperative anticoagulation management from her PCP DIABETES MELLITUS, CONTROLLED (250.00  E11.9) ANTICOAGULATED BY ANTICOAGULATION TREATMENT (V58.61  Z79.01)  Leighton Ruff. Redmond Pulling, MD, FACS General, Bariatric, & Minimally Invasive Surgery [facility]  Surgery, [place]

## 2014-09-13 ENCOUNTER — Encounter (HOSPITAL_COMMUNITY)
Admission: RE | Admit: 2014-09-13 | Discharge: 2014-09-13 | Disposition: A | Discharge status: Home / Self Care | Payer: Federal, State, Local not specified - PPO | Source: Ambulatory Visit | Attending: General Surgery | Admitting: General Surgery

## 2014-09-13 ENCOUNTER — Encounter (HOSPITAL_COMMUNITY): Payer: Self-pay

## 2014-09-13 DIAGNOSIS — K8018 Calculus of gallbladder with other cholecystitis without obstruction: Secondary | ICD-10-CM | POA: Diagnosis present

## 2014-09-13 DIAGNOSIS — F329 Major depressive disorder, single episode, unspecified: Secondary | ICD-10-CM | POA: Diagnosis not present

## 2014-09-13 DIAGNOSIS — Z6837 Body mass index (BMI) 37.0-37.9, adult: Secondary | ICD-10-CM | POA: Diagnosis not present

## 2014-09-13 DIAGNOSIS — Z86718 Personal history of other venous thrombosis and embolism: Secondary | ICD-10-CM | POA: Diagnosis not present

## 2014-09-13 DIAGNOSIS — E119 Type 2 diabetes mellitus without complications: Secondary | ICD-10-CM | POA: Diagnosis not present

## 2014-09-13 DIAGNOSIS — K219 Gastro-esophageal reflux disease without esophagitis: Secondary | ICD-10-CM | POA: Diagnosis not present

## 2014-09-13 DIAGNOSIS — K8010 Calculus of gallbladder with chronic cholecystitis without obstruction: Secondary | ICD-10-CM | POA: Diagnosis not present

## 2014-09-13 HISTORY — DX: Gastro-esophageal reflux disease without esophagitis: K21.9

## 2014-09-13 HISTORY — DX: Presence of spectacles and contact lenses: Z97.3

## 2014-09-13 HISTORY — DX: Anxiety disorder, unspecified: F41.9

## 2014-09-13 HISTORY — DX: Calculus of gallbladder without cholecystitis without obstruction: K80.20

## 2014-09-13 HISTORY — DX: Deep phlebothrombosis in pregnancy, unspecified trimester: O22.30

## 2014-09-13 LAB — HCG, SERUM, QUALITATIVE: Preg, Serum: NEGATIVE

## 2014-09-13 LAB — PROTIME-INR
INR: 0.91 (ref 0.00–1.49)
Prothrombin Time: 12.4 seconds (ref 11.6–15.2)

## 2014-09-13 LAB — APTT: aPTT: 26 seconds (ref 24–37)

## 2014-09-13 NOTE — Progress Notes (Signed)
Pt denies SOB, chest pain, and being under the care of a cardiologist. Pt denies having a stress test and cardiac cath. Pt stated that her last dose of Xarelto was Friday 09/10/14 and she has since been on a Lovenox bridge. Pt chart forwarded to anesthesia for review of EKG.

## 2014-09-13 NOTE — Pre-Procedure Instructions (Signed)
Samantha Maynard  09/13/2014   Your procedure is scheduled on: Wednesday, September 15, 2014  Report to [facility] Admitting at 11:00 AM.  Call this number if you have problems the morning of surgery: [phone]   Remember:   Do not eat food or drink liquids after midnight Tuesday, September 14, 2014   Take these medicines the morning of surgery with A SIP OF WATER: if needed: pain medication,  cetirizine (ZYRTEC) for allergies  DO NOT take any diabetic medication the morning of procedure such as metFORMIN (GLUCOPHAGE)  Stop taking Aspirin, vitamins and herbal medications such as glucosamine-chondroitin  Do not take any NSAIDs ie: Ibuprofen, Advil, Naproxen or any medication containing Aspirin; stop now.    Do not wear jewelry, make-up or nail polish.  Do not wear lotions, powders, or perfumes. You may not wear deodorant.  Do not shave 48 hours prior to surgery.  Do not bring valuables to the hospital.  [facility] is not responsible for any belongings or valuables.               Contacts, dentures or bridgework may not be worn into surgery.  Leave suitcase in the car. After surgery it may be brought to your room.  For patients admitted to the hospital, discharge time is determined by your treatment team.               Patients discharged the day of surgery will not be allowed to drive home.  Name and phone number of your driver:   Special Instructions:  Special Instructions:Special Instructions: [facility] - Preparing for Surgery  Before surgery, you can play an important role.  Because skin is not sterile, your skin needs to be as free of germs as possible.  You can reduce the number of germs on you skin by washing with CHG (chlorahexidine gluconate) soap before surgery.  CHG is an antiseptic cleaner which kills germs and bonds with the skin to continue killing germs even after washing.  Please DO NOT use if you have an allergy to CHG or antibacterial soaps.  If your skin  becomes reddened/irritated stop using the CHG and inform your nurse when you arrive at Short Stay.  Do not shave (including legs and underarms) for at least 48 hours prior to the first CHG shower.  You may shave your face.  Please follow these instructions carefully:   1.  Shower with CHG Soap the night before surgery and the morning of Surgery.  2.  If you choose to wash your hair, wash your hair first as usual with your normal shampoo.  3.  After you shampoo, rinse your hair and body thoroughly to remove the Shampoo.  4.  Use CHG as you would any other liquid soap.  You can apply chg directly  to the skin and wash gently with scrungie or a clean washcloth.  5.  Apply the CHG Soap to your body ONLY FROM THE NECK DOWN.  Do not use on open wounds or open sores.  Avoid contact with your eyes, ears, mouth and genitals (private parts).  Wash genitals (private parts) with your normal soap.  6.  Wash thoroughly, paying special attention to the area where your surgery will be performed.  7.  Thoroughly rinse your body with warm water from the neck down.  8.  DO NOT shower/wash with your normal soap after using and rinsing off the CHG Soap.  9.  Pat yourself dry with a clean  towel.            10.  Wear clean pajamas.            11.  Place clean sheets on your bed the night of your first shower and do not sleep with pets.  Day of Surgery  Do not apply any lotions/deodorants the morning of surgery.  Please wear clean clothes to the hospital/surgery center.   Please read over the following fact sheets that you were given: Pain Booklet, Coughing and Deep Breathing and Surgical Site Infection Prevention

## 2014-09-14 MED ORDER — HEPARIN SODIUM (PORCINE) 5000 UNIT/ML IJ SOLN
5000.0000 [IU] | Freq: Once | INTRAMUSCULAR | Status: AC
  Administered 2014-09-15: 5000 [IU] via SUBCUTANEOUS
  Filled 2014-09-14: qty 1

## 2014-09-14 MED ORDER — CHLORHEXIDINE GLUCONATE 4 % EX LIQD
1.0000 "application " | Freq: Once | CUTANEOUS | Status: DC
  Filled 2014-09-14: qty 15

## 2014-09-14 MED ORDER — DEXTROSE 5 % IV SOLN
2.0000 g | INTRAVENOUS | Status: AC
  Administered 2014-09-15: 2 g via INTRAVENOUS
  Filled 2014-09-14: qty 2

## 2014-09-14 NOTE — Progress Notes (Signed)
Anesthesia Chart Review:  Patient is a 54 year old female scheduled for laparoscopic cholecystectomy on 09/15/14 by Dr. Redmond Pulling.  History includes LLE DVT, recurrent PE 07/2013 (on Xarelto), non-smoker, iron deficiency anemia, ADD, GERD, anxiety, DM2. 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 PCP is listed as Dr. Lysle Rubens. HEM is Dr. Marin Olp.   Meds includes Zyrtec, Klonopin, Iron, Vyvanse, metformin, Percocet, Xarelto, Effexor Xr. Last Xarelto 09/10/14, and is now on a Lovenox bridge.   08/12/13 Echo (Care Everywhere):  The aortic valve is trileaflet. There is a small pericardial effusion without echocardiographic evidence of tamponade. The left atrium is mildly dilated. The left ventricle is normal in size. There is normal left ventricular wall thickness. The left ventricular ejection fraction is normal (60-65%). The left ventricular wall motion is normal. Grade I mild diastolic dysfunction; abnormal relaxation pattern. Estimation of right ventricular systolic pressure is not possible.  09/03/14 EKG: SR, consider LAE, low voltage precordial leads, poor r wave progression consider anterior infarct.  No significant change when compared to 08/13/13 tracing.  Labs from 09/03/14 and 09/13/14 noted.   If no acute changes then I anticipate that she can proceed as planned.  George Hugh [facility] Short Stay Center/Anesthesiology Phone [phone] 09/14/2014 10:31 AM

## 2014-09-15 ENCOUNTER — Encounter (HOSPITAL_COMMUNITY): Payer: Self-pay

## 2014-09-15 ENCOUNTER — Observation Stay (HOSPITAL_COMMUNITY)
Admission: RE | Admit: 2014-09-15 | Discharge: 2014-09-16 | Disposition: A | Discharge status: Home / Self Care | Payer: Federal, State, Local not specified - PPO | Source: Ambulatory Visit | Attending: General Surgery | Admitting: General Surgery

## 2014-09-15 ENCOUNTER — Encounter (HOSPITAL_COMMUNITY)
Admission: RE | Disposition: A | Discharge status: Home / Self Care | Payer: Self-pay | Source: Ambulatory Visit | Attending: General Surgery

## 2014-09-15 ENCOUNTER — Ambulatory Visit (HOSPITAL_COMMUNITY): Payer: Federal, State, Local not specified - PPO

## 2014-09-15 ENCOUNTER — Ambulatory Visit (HOSPITAL_COMMUNITY): Payer: Federal, State, Local not specified - PPO | Admitting: Vascular Surgery

## 2014-09-15 ENCOUNTER — Ambulatory Visit (HOSPITAL_COMMUNITY): Payer: Federal, State, Local not specified - PPO | Admitting: Anesthesiology

## 2014-09-15 DIAGNOSIS — E119 Type 2 diabetes mellitus without complications: Secondary | ICD-10-CM | POA: Insufficient documentation

## 2014-09-15 DIAGNOSIS — K8020 Calculus of gallbladder without cholecystitis without obstruction: Secondary | ICD-10-CM

## 2014-09-15 DIAGNOSIS — Z9049 Acquired absence of other specified parts of digestive tract: Secondary | ICD-10-CM

## 2014-09-15 DIAGNOSIS — K8010 Calculus of gallbladder with chronic cholecystitis without obstruction: Secondary | ICD-10-CM | POA: Diagnosis not present

## 2014-09-15 DIAGNOSIS — Z6837 Body mass index (BMI) 37.0-37.9, adult: Secondary | ICD-10-CM | POA: Insufficient documentation

## 2014-09-15 DIAGNOSIS — Z86718 Personal history of other venous thrombosis and embolism: Secondary | ICD-10-CM | POA: Insufficient documentation

## 2014-09-15 DIAGNOSIS — F329 Major depressive disorder, single episode, unspecified: Secondary | ICD-10-CM | POA: Insufficient documentation

## 2014-09-15 DIAGNOSIS — K219 Gastro-esophageal reflux disease without esophagitis: Secondary | ICD-10-CM | POA: Insufficient documentation

## 2014-09-15 HISTORY — PX: CHOLECYSTECTOMY: SHX55

## 2014-09-15 LAB — GLUCOSE, CAPILLARY
Glucose-Capillary: 111 mg/dL — ABNORMAL HIGH (ref 70–99)
Glucose-Capillary: 130 mg/dL — ABNORMAL HIGH (ref 70–99)
Glucose-Capillary: 143 mg/dL — ABNORMAL HIGH (ref 70–99)

## 2014-09-15 SURGERY — LAPAROSCOPIC CHOLECYSTECTOMY WITH INTRAOPERATIVE CHOLANGIOGRAM
Anesthesia: General | Site: Abdomen

## 2014-09-15 MED ORDER — KCL IN DEXTROSE-NACL 20-5-0.45 MEQ/L-%-% IV SOLN
INTRAVENOUS | Status: DC
  Administered 2014-09-15 – 2014-09-16 (×2): via INTRAVENOUS
  Filled 2014-09-15 (×3): qty 1000

## 2014-09-15 MED ORDER — FENTANYL CITRATE (PF) 250 MCG/5ML IJ SOLN
INTRAMUSCULAR | Status: AC
  Filled 2014-09-15: qty 5

## 2014-09-15 MED ORDER — FENTANYL CITRATE (PF) 100 MCG/2ML IJ SOLN
INTRAMUSCULAR | Status: AC
  Administered 2014-09-15: 50 ug via INTRAVENOUS
  Filled 2014-09-15: qty 2

## 2014-09-15 MED ORDER — HEPARIN SODIUM (PORCINE) 5000 UNIT/ML IJ SOLN
5000.0000 [IU] | Freq: Three times a day (TID) | INTRAMUSCULAR | Status: DC
  Administered 2014-09-15 – 2014-09-16 (×2): 5000 [IU] via SUBCUTANEOUS
  Filled 2014-09-15 (×2): qty 1

## 2014-09-15 MED ORDER — ACETAMINOPHEN 325 MG PO TABS: 650.0000 mg | ORAL_TABLET | ORAL | Status: DC | PRN

## 2014-09-15 MED ORDER — MIDAZOLAM HCL 2 MG/2ML IJ SOLN
INTRAMUSCULAR | Status: AC
  Filled 2014-09-15: qty 2

## 2014-09-15 MED ORDER — 0.9 % SODIUM CHLORIDE (POUR BTL) OPTIME
TOPICAL | Status: DC | PRN
  Administered 2014-09-15: 1000 mL

## 2014-09-15 MED ORDER — SODIUM CHLORIDE 0.9 % IV SOLN
INTRAVENOUS | Status: DC | PRN
  Administered 2014-09-15: 30 mL

## 2014-09-15 MED ORDER — OXYCODONE HCL 5 MG PO TABS: 5.0000 mg | ORAL_TABLET | Freq: Once | ORAL | Status: DC | PRN

## 2014-09-15 MED ORDER — MORPHINE SULFATE 2 MG/ML IJ SOLN: 1.0000 mg | INTRAMUSCULAR | Status: DC | PRN

## 2014-09-15 MED ORDER — DEXAMETHASONE SODIUM PHOSPHATE 4 MG/ML IJ SOLN
INTRAMUSCULAR | Status: DC | PRN
  Administered 2014-09-15: 4 mg via INTRAVENOUS

## 2014-09-15 MED ORDER — FENTANYL CITRATE (PF) 100 MCG/2ML IJ SOLN
INTRAMUSCULAR | Status: DC | PRN
  Administered 2014-09-15: 100 ug via INTRAVENOUS
  Administered 2014-09-15 (×3): 50 ug via INTRAVENOUS
  Administered 2014-09-15: 100 ug via INTRAVENOUS

## 2014-09-15 MED ORDER — METFORMIN HCL 500 MG PO TABS
500.0000 mg | ORAL_TABLET | Freq: Two times a day (BID) | ORAL | Status: DC
  Administered 2014-09-15 – 2014-09-16 (×2): 500 mg via ORAL
  Filled 2014-09-15 (×2): qty 1

## 2014-09-15 MED ORDER — CLONAZEPAM 1 MG PO TABS: 1.0000 mg | ORAL_TABLET | Freq: Every evening | ORAL | Status: DC | PRN

## 2014-09-15 MED ORDER — VENLAFAXINE HCL ER 75 MG PO CP24
75.0000 mg | ORAL_CAPSULE | Freq: Every day | ORAL | Status: DC
  Administered 2014-09-15: 75 mg via ORAL
  Filled 2014-09-15: qty 1

## 2014-09-15 MED ORDER — SODIUM CHLORIDE 0.9 % IR SOLN
Status: DC | PRN
  Administered 2014-09-15: 1000 mL

## 2014-09-15 MED ORDER — OXYCODONE-ACETAMINOPHEN 5-325 MG PO TABS
2.0000 | ORAL_TABLET | ORAL | Status: DC | PRN
  Administered 2014-09-15 – 2014-09-16 (×3): 2 via ORAL
  Filled 2014-09-15 (×3): qty 2

## 2014-09-15 MED ORDER — LIDOCAINE HCL (CARDIAC) 20 MG/ML IV SOLN
INTRAVENOUS | Status: DC | PRN
  Administered 2014-09-15: 80 mg via INTRAVENOUS

## 2014-09-15 MED ORDER — ROCURONIUM BROMIDE 50 MG/5ML IV SOLN
INTRAVENOUS | Status: AC
  Filled 2014-09-15: qty 1

## 2014-09-15 MED ORDER — ACETAMINOPHEN 160 MG/5ML PO SOLN: 650.0000 mg | ORAL | Status: DC | PRN

## 2014-09-15 MED ORDER — PROPOFOL 10 MG/ML IV BOLUS
INTRAVENOUS | Status: AC
  Filled 2014-09-15: qty 20

## 2014-09-15 MED ORDER — ONDANSETRON HCL 4 MG/2ML IJ SOLN
INTRAMUSCULAR | Status: DC | PRN
  Administered 2014-09-15: 4 mg via INTRAVENOUS

## 2014-09-15 MED ORDER — SENNA 8.6 MG PO TABS: 2.0000 | ORAL_TABLET | Freq: Every evening | ORAL | Status: DC | PRN

## 2014-09-15 MED ORDER — NEOSTIGMINE METHYLSULFATE 10 MG/10ML IV SOLN
INTRAVENOUS | Status: DC | PRN
  Administered 2014-09-15: 3 mg via INTRAVENOUS

## 2014-09-15 MED ORDER — LACTATED RINGERS IV SOLN
INTRAVENOUS | Status: DC
  Administered 2014-09-15 (×3): via INTRAVENOUS

## 2014-09-15 MED ORDER — OXYCODONE HCL 5 MG/5ML PO SOLN: 5.0000 mg | Freq: Once | ORAL | Status: DC | PRN

## 2014-09-15 MED ORDER — DEXTROSE 5 % IV SOLN
1000.0000 mg | Freq: Three times a day (TID) | INTRAVENOUS | Status: DC | PRN
  Filled 2014-09-15: qty 10

## 2014-09-15 MED ORDER — DEXTROSE 5 % IV SOLN
2.0000 g | INTRAVENOUS | Status: DC | PRN
  Administered 2014-09-15: 0 g via INTRAVENOUS

## 2014-09-15 MED ORDER — DEXAMETHASONE SODIUM PHOSPHATE 4 MG/ML IJ SOLN
INTRAMUSCULAR | Status: AC
  Filled 2014-09-15: qty 1

## 2014-09-15 MED ORDER — GLYCOPYRROLATE 0.2 MG/ML IJ SOLN
INTRAMUSCULAR | Status: AC
  Filled 2014-09-15: qty 3

## 2014-09-15 MED ORDER — ONDANSETRON HCL 4 MG/2ML IJ SOLN: 4.0000 mg | Freq: Four times a day (QID) | INTRAMUSCULAR | Status: DC | PRN

## 2014-09-15 MED ORDER — ROCURONIUM BROMIDE 100 MG/10ML IV SOLN
INTRAVENOUS | Status: DC | PRN
  Administered 2014-09-15: 40 mg via INTRAVENOUS

## 2014-09-15 MED ORDER — PROPOFOL 10 MG/ML IV BOLUS
INTRAVENOUS | Status: DC | PRN
  Administered 2014-09-15: 60 mg via INTRAVENOUS
  Administered 2014-09-15: 140 mg via INTRAVENOUS

## 2014-09-15 MED ORDER — GLYCOPYRROLATE 0.2 MG/ML IJ SOLN
INTRAMUSCULAR | Status: DC | PRN
  Administered 2014-09-15: 0.4 mg via INTRAVENOUS

## 2014-09-15 MED ORDER — BUPIVACAINE-EPINEPHRINE (PF) 0.25% -1:200000 IJ SOLN
INTRAMUSCULAR | Status: AC
  Filled 2014-09-15: qty 30

## 2014-09-15 MED ORDER — NEOSTIGMINE METHYLSULFATE 10 MG/10ML IV SOLN
INTRAVENOUS | Status: AC
  Filled 2014-09-15: qty 1

## 2014-09-15 MED ORDER — FENTANYL CITRATE (PF) 100 MCG/2ML IJ SOLN
25.0000 ug | INTRAMUSCULAR | Status: DC | PRN
  Administered 2014-09-15: 25 ug via INTRAVENOUS
  Administered 2014-09-15: 50 ug via INTRAVENOUS

## 2014-09-15 MED ORDER — PROMETHAZINE HCL 25 MG/ML IJ SOLN: 12.5000 mg | Freq: Four times a day (QID) | INTRAMUSCULAR | Status: DC | PRN

## 2014-09-15 MED ORDER — ONDANSETRON HCL 4 MG PO TABS: 4.0000 mg | ORAL_TABLET | Freq: Four times a day (QID) | ORAL | Status: DC | PRN

## 2014-09-15 MED ORDER — BUPIVACAINE-EPINEPHRINE 0.25% -1:200000 IJ SOLN
INTRAMUSCULAR | Status: DC | PRN
  Administered 2014-09-15: 30 mL

## 2014-09-15 SURGICAL SUPPLY — 48 items
APPLIER CLIP 5 13 M/L LIGAMAX5 (MISCELLANEOUS) ×4
BANDAGE ADH SHEER 1  50/CT (GAUZE/BANDAGES/DRESSINGS) ×6 IMPLANT
BENZOIN TINCTURE PRP APPL 2/3 (GAUZE/BANDAGES/DRESSINGS) ×2 IMPLANT
BLADE SURG ROTATE 9660 (MISCELLANEOUS) IMPLANT
CANISTER SUCTION 2500CC (MISCELLANEOUS) ×2 IMPLANT
CHLORAPREP W/TINT 26ML (MISCELLANEOUS) ×2 IMPLANT
CLIP APPLIE 5 13 M/L LIGAMAX5 (MISCELLANEOUS) ×2 IMPLANT
COVER MAYO STAND STRL (DRAPES) ×4 IMPLANT
COVER SURGICAL LIGHT HANDLE (MISCELLANEOUS) ×2 IMPLANT
DRAPE C-ARM 42X72 X-RAY (DRAPES) ×2 IMPLANT
DRAPE LAPAROSCOPIC ABDOMINAL (DRAPES) ×2 IMPLANT
DRSG TEGADERM 4X4.75 (GAUZE/BANDAGES/DRESSINGS) ×2 IMPLANT
ELECT REM PT RETURN 9FT ADLT (ELECTROSURGICAL) ×2
ELECTRODE REM PT RTRN 9FT ADLT (ELECTROSURGICAL) ×1 IMPLANT
GAUZE SPONGE 2X2 8PLY STRL LF (GAUZE/BANDAGES/DRESSINGS) ×1 IMPLANT
GLOVE BIO SURGEON STRL SZ 6.5 (GLOVE) ×2 IMPLANT
GLOVE BIOGEL M STRL SZ7.5 (GLOVE) ×2 IMPLANT
GLOVE BIOGEL PI IND STRL 6.5 (GLOVE) ×1 IMPLANT
GLOVE BIOGEL PI IND STRL 7.0 (GLOVE) ×1 IMPLANT
GLOVE BIOGEL PI IND STRL 8 (GLOVE) ×1 IMPLANT
GLOVE BIOGEL PI INDICATOR 6.5 (GLOVE) ×1
GLOVE BIOGEL PI INDICATOR 7.0 (GLOVE) ×1
GLOVE BIOGEL PI INDICATOR 8 (GLOVE) ×1
GLOVE SURG SS PI 6.5 STRL IVOR (GLOVE) ×2 IMPLANT
GOWN STRL REUS W/ TWL LRG LVL3 (GOWN DISPOSABLE) ×3 IMPLANT
GOWN STRL REUS W/ TWL XL LVL3 (GOWN DISPOSABLE) ×1 IMPLANT
GOWN STRL REUS W/TWL LRG LVL3 (GOWN DISPOSABLE) ×3
GOWN STRL REUS W/TWL XL LVL3 (GOWN DISPOSABLE) ×1
KIT BASIN OR (CUSTOM PROCEDURE TRAY) ×2 IMPLANT
KIT ROOM TURNOVER OR (KITS) ×2 IMPLANT
NS IRRIG 1000ML POUR BTL (IV SOLUTION) ×2 IMPLANT
PAD ARMBOARD 7.5X6 YLW CONV (MISCELLANEOUS) ×2 IMPLANT
POUCH SPECIMEN RETRIEVAL 10MM (ENDOMECHANICALS) ×2 IMPLANT
SCISSORS LAP 5X35 DISP (ENDOMECHANICALS) ×2 IMPLANT
SET CHOLANGIOGRAPH 5 50 .035 (SET/KITS/TRAYS/PACK) ×2 IMPLANT
SET IRRIG TUBING LAPAROSCOPIC (IRRIGATION / IRRIGATOR) ×2 IMPLANT
SLEEVE ENDOPATH XCEL 5M (ENDOMECHANICALS) ×6 IMPLANT
SPECIMEN JAR SMALL (MISCELLANEOUS) ×2 IMPLANT
SPONGE GAUZE 2X2 STER 10/PKG (GAUZE/BANDAGES/DRESSINGS) ×1
STRIP CLOSURE SKIN 1/2X4 (GAUZE/BANDAGES/DRESSINGS) ×2 IMPLANT
SUT MNCRL AB 4-0 PS2 18 (SUTURE) ×2 IMPLANT
SUT VICRYL 0 UR6 27IN ABS (SUTURE) ×2 IMPLANT
TOWEL OR 17X24 6PK STRL BLUE (TOWEL DISPOSABLE) ×2 IMPLANT
TOWEL OR 17X26 10 PK STRL BLUE (TOWEL DISPOSABLE) ×2 IMPLANT
TRAY LAPAROSCOPIC (CUSTOM PROCEDURE TRAY) ×2 IMPLANT
TROCAR XCEL BLUNT TIP 100MML (ENDOMECHANICALS) ×2 IMPLANT
TROCAR XCEL NON-BLD 5MMX100MML (ENDOMECHANICALS) ×2 IMPLANT
TUBING INSUFFLATION (TUBING) ×2 IMPLANT

## 2014-09-15 NOTE — Transfer of Care (Signed)
Immediate Anesthesia Transfer of Care Note  Patient: Samantha Maynard  Procedure(s) Performed: Procedure(s): LAPAROSCOPIC CHOLECYSTECTOMY WITH  INTRAOPERATIVE CHOLANGIOGRAM (N/A)  Patient Location: PACU  Anesthesia Type:General  Level of Consciousness: awake, alert  and oriented  Airway & Oxygen Therapy: Patient Spontanous Breathing and Patient connected to nasal cannula oxygen  Post-op Assessment: Report given to RN and Post -op Vital signs reviewed and stable  Post vital signs: Reviewed and stable  Last Vitals:  Filed Vitals:   09/15/14 1539  BP: 167/84  Pulse: 52  Temp: 36.6 C  Resp: 10    Complications: No apparent anesthesia complications

## 2014-09-15 NOTE — Op Note (Signed)
Samantha Maynard [id number] Aug 29, 1960 09/15/2014  Laparoscopic Cholecystectomy with IOC Procedure Note  Indications: This patient presents with symptomatic gallbladder disease and will undergo laparoscopic cholecystectomy.  Pre-operative Diagnosis: symptomatic cholelithiasis  Post-operative Diagnosis: Calculus of gallbladder with other cholecystitis, without mention of obstruction  Surgeon: Gayland Curry   Assistants: Judyann Munson, RN FA  Anesthesia: General endotracheal anesthesia  ASA Class: 2  Procedure Details  The patient was seen again in the Holding Room. The risks, benefits, complications, treatment options, and expected outcomes were discussed with the patient. The possibilities of reaction to medication, pulmonary aspiration, perforation of viscus, bleeding, recurrent infection, finding a normal gallbladder, the need for additional procedures, failure to diagnose a condition, the possible need to convert to an open procedure, and creating a complication requiring transfusion or operation were discussed with the patient. The likelihood of improving the patient's symptoms with return to their baseline status is good.  The patient and/or family concurred with the proposed plan, giving informed consent. The site of surgery properly noted. The patient was taken to Operating Room, identified as Samantha Maynard and the procedure verified as Laparoscopic Cholecystectomy with Intraoperative Cholangiogram. A Time Out was held and the above information confirmed. Antibiotic prophylaxis was administered.   Prior to the induction of general anesthesia, antibiotic prophylaxis was administered. General endotracheal anesthesia was then administered and tolerated well. After the induction, the abdomen was prepped with Chloraprep and draped in the sterile fashion. The patient was positioned in the supine position.  Local anesthetic agent was injected into the skin near the umbilicus and an incision  made. We dissected down to the abdominal fascia with blunt dissection.  The fascia was incised vertically and we entered the peritoneal cavity bluntly.  A pursestring suture of 0-Vicryl was placed around the fascial opening.  The Hasson cannula was inserted and secured with the stay suture.  Pneumoperitoneum was then created with CO2 and tolerated well without any adverse changes in the patient's vital signs. An 5-mm port was placed in the subxiphoid position.  Two 5-mm ports were placed in the right upper quadrant. All skin incisions were infiltrated with a local anesthetic agent before making the incision and placing the trocars.   We positioned the patient in reverse Trendelenburg, tilted slightly to the patient's left.  The gallbladder was identified, the fundus grasped and retracted cephalad. Adhesions were lysed bluntly and with the electrocautery where indicated, taking care not to injure any adjacent organs or viscus. The infundibulum was grasped and retracted laterally, exposing the peritoneum overlying the triangle of Calot. This was then divided and exposed in a blunt fashion. A critical view of the cystic duct and cystic artery was obtained.  The cystic duct was clearly identified and bluntly dissected circumferentially. The cystic duct was ligated with a clip distally.   An incision was made in the cystic duct and the [facility] cholangiogram catheter introduced. The catheter was secured using a clip. There was some resistance so the catheter was removed. I milked 2 stones from the cystic duct and replaced the catheter in the cystic duct. There was leak of saline when flushed. I attempted a cholangiogram which just showed extravasation of contrast.  We re-established pneumoperitoneum. I carefully dissected some the surrounding fat from around the cystic duct and was able to visualize the catheter going thru back wall of the cystic duct. There was still 1.5cm of proximal (biliary side) cystic duct readily  visible and clear below where the catheter was going thru the back wall  of the cystic duct. I tried to advance the catheter pass the defect once but was unsuccessful. I decided not to try any further.   The cystic duct was then ligated with clips below the defect in the posterior wall and divided. The cystic artery which had been identified & dissected free was ligated with clips and divided as well.   The gallbladder was dissected from the liver bed in retrograde fashion with the electrocautery. There was some spillage of bile from the gallbladder - no stones. The gallbladder was removed and placed in an Endocatch sac.  The gallbladder and Endocatch sac were then removed through the umbilical port site. The liver bed was irrigated and inspected. Hemostasis was achieved with the electrocautery. Copious irrigation was utilized and was repeatedly aspirated until clear.  The pursestring suture was used to close the umbilical fascia.  I placed 2 additional interrupted umbilical fasical sutures with 0 vicryl.   We again inspected the right upper quadrant for hemostasis.  The umbilical closure was inspected and there was no air leak and nothing trapped within the closure. Pneumoperitoneum was released as we removed the trocars.  4-0 Monocryl was used to close the skin.   Benzoin, steri-strips, and clean dressings were applied. The patient was then extubated and brought to the recovery room in stable condition. Instrument, sponge, and needle counts were correct at closure and at the conclusion of the case.   Findings: Cholecystitis with Cholelithiasis  Estimated Blood Loss: Minimal         Drains: none         Specimens: Gallbladder           Complications: None; patient tolerated the procedure well.         Disposition: PACU - hemodynamically stable.         Condition: stable  Leighton Ruff. Redmond Pulling, MD, FACS General, Bariatric, & Minimally Invasive Surgery [facility], [place]

## 2014-09-15 NOTE — Discharge Instructions (Signed)
[facility] LAPAROSCOPIC SURGERY: POST OP INSTRUCTIONS Always review your discharge instruction sheet given to you by the facility where your surgery was performed. IF YOU HAVE DISABILITY OR FAMILY LEAVE FORMS, YOU MUST BRING THEM TO THE OFFICE FOR PROCESSING.   DO NOT GIVE THEM TO YOUR DOCTOR.  1. A prescription for pain medication may be given to you upon discharge.  Take your pain medication as prescribed, if needed.  If narcotic pain medicine is not needed, then you may take acetaminophen (Tylenol) or ibuprofen (Advil) as needed. 2. Take your usually prescribed medications unless otherwise directed. 3. If you need a refill on your pain medication, please contact your pharmacy.  They will contact our office to request authorization. Prescriptions will not be filled after 5pm or on week-ends. 4. You should follow a light diet the first few days after arrival home, such as soup and crackers, etc.  Be sure to include lots of fluids daily. 5. Most patients will experience some swelling and bruising in the area of the incisions.  Ice packs will help.  Swelling and bruising can take several days to resolve.  6. It is common to experience some constipation if taking pain medication after surgery.  Increasing fluid intake and taking a stool softener (such as Colace) will usually help or prevent this problem from occurring.  A mild laxative (Milk of Magnesia or Miralax) should be taken according to package instructions if there are no bowel movements after 48 hours. 7. Unless discharge instructions indicate otherwise, you may remove your bandages 48 hours after surgery, and you may shower at that time.  You  have steri-strips (small skin tapes) in place directly over the incision.  These strips should be left on the skin for 7-10 days.  8. ACTIVITIES:  You may resume regular (light) daily activities beginning the next day--such as daily self-care, walking, climbing stairs--gradually  increasing activities as tolerated.  You may have sexual intercourse when it is comfortable.  Refrain from any heavy lifting or straining until approved by your doctor. a. You may drive when you are no longer taking prescription pain medication, you can comfortably wear a seatbelt, and you can safely maneuver your car and apply brakes. 9. You should see your doctor in the office for a follow-up appointment approximately 2-3 weeks after your surgery.  Make sure that you call for this appointment within a day or two after you arrive home to insure a convenient appointment time. 10. OTHER INSTRUCTIONS:  PLEASE GET YOUR CBC CHECKED ON MONDAY  WHEN TO CALL YOUR DOCTOR: 1. Fever over 101.0 2. Inability to urinate 3. Continued bleeding from incision. 4. Increased pain, redness, or drainage from the incision. 5. Increasing abdominal pain  The clinic staff is available to answer your questions during regular business hours.  Please dont hesitate to call and ask to speak to one of the nurses for clinical concerns.  If you have a medical emergency, go to the nearest emergency room or call 911.  A surgeon from [facility] is always on call at the hospital. [address] [place], [place], [place]  [address] ? [address], [place], [place]   [address] [phone] ? [phone] ? FAX [phone] Web site: [email or web address]

## 2014-09-15 NOTE — H&P (View-Only) (Signed)
Samantha Maynard 08/25/2014 8:49 AM Location: [facility] Patient #: [id number] DOB: 03-14-1961 Married / Language: English / Race: White Female  History of Present Illness Randall Hiss M. Lateef Juncaj MD; 08/25/2014 9:37 AM) Patient words: gallbladder.  The patient is a 54 year old female who presents for evaluation of gall stones. She is referred by Dr Lysle Rubens for evaluation of epigastric pain. She states about a month ago after eating dinner she had acute onset of sharp epigastric pain radiating under both ribs to her back. She initially thought she was having a heart attack. She went to [facility]. A heart attack was ruled out with enzymes and EKG. Labs were essentially normal except for mild elevation of her AST and ALT of 45 and 43 respectively. She underwent a CT scan which demonstrated mild intrahepatic ductal dilatation with some mild pericholecystic fluid and a hiatal hernia. She was discharged. She followed up with her primary care physician who ordered an ultrasound of her abdomen which showed multiple gallstones without any pericholecystic fluid. Her common bile duct was normal. She has not had any additional tacks since then. She did have nausea with that first episode. She denies any weight loss. She denies any jaundice. She denies any NSAID use. She denies any trouble swallowing foods. 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 She had bilateral PEs diagnosed in March 2015. She was hospitalized to [facility]. An echocardiogram from March 2015 showed normal wall motion, normal ejection fraction, mild grade 1 diastolic dysfunction. She sees Dr. Marin Olp for iron deficiency anemia   Other Problems Gayland Curry, MD; 08/25/2014 9:38 AM) Back Pain Cholelithiasis Depression Gastroesophageal Reflux Disease Inguinal Hernia Other disease, cancer, significant illness Transfusion history SYMPTOMATIC  CHOLELITHIASIS (574.20  K80.20) ANTICOAGULATED BY ANTICOAGULATION TREATMENT (V58.61  Z79.01) Pulmonary Embolism / Blood Clot in Legs Diabetes Mellitus  Past Surgical History Marjean Donna, CMA; 08/25/2014 8:49 AM) Tonsillectomy  Diagnostic Studies History Marjean Donna, CMA; 08/25/2014 8:49 AM) Colonoscopy 1-5 years ago Mammogram within last year Pap Smear 1-5 years ago  Allergies ([place], CMA; 08/25/2014 8:50 AM) No Known Drug Allergies04/10/2014  Medication History (Sonya Bynum, CMA; 08/25/2014 8:51 AM) Vyvanse (70MG  Capsule, Oral) Active. ClonazePAM (1MG  Tablet, Oral) Active. MetFORMIN HCl (500MG  Tablet, Oral) Active. Venlafaxine HCl ER (150MG  Capsule ER 24HR, Oral) Active. Xarelto (20MG  Tablet, Oral) Active. Medications Reconciled  Social History Marjean Donna, CMA; 08/25/2014 8:49 AM) Alcohol use Occasional alcohol use. Caffeine use Coffee, Tea. No drug use Tobacco use Never smoker.  Family History Marjean Donna, [place]; 08/25/2014 8:49 AM) Arthritis Mother. Depression Mother. Heart Disease Father. Hypertension Mother, Sister. Melanoma Father.  Pregnancy / Birth History Marjean Donna, [place]; 08/25/2014 8:49 AM) Age at menarche 29 years. Age of menopause 41-55 Gravida 2 Irregular periods Maternal age 60-25 Para 2  Review of Systems ([place]; 08/25/2014 8:49 AM) General Present- Fatigue. Not Present- Appetite Loss, Chills, Fever, Night Sweats, Weight Gain and Weight Loss. Skin Not Present- Change in Wart/Mole, Dryness, Hives, Jaundice, New Lesions, Non-Healing Wounds, Rash and Ulcer. HEENT Present- Seasonal Allergies and Wears glasses/contact lenses. Not Present- Earache, Hearing Loss, Hoarseness, Nose Bleed, Oral Ulcers, Ringing in the Ears, Sinus Pain, Sore Throat, Visual Disturbances and Yellow Eyes. Respiratory Present- Snoring. Not Present- Bloody sputum, Chronic Cough, Difficulty Breathing and Wheezing. Breast Not Present- Breast Mass,  Breast Pain, Nipple Discharge and Skin Changes. Cardiovascular Present- Swelling of Extremities. Not Present- Chest Pain, Difficulty Breathing Lying Down,  Leg Cramps, Palpitations, Rapid Heart Rate and Shortness of Breath. Gastrointestinal Present- Bloating and Constipation. Not Present- Abdominal Pain, Bloody Stool, Change in Bowel Habits, Chronic diarrhea, Difficulty Swallowing, Excessive gas, Gets full quickly at meals, Hemorrhoids, Indigestion, Nausea, Rectal Pain and Vomiting. Female Genitourinary Not Present- Frequency, Nocturia, Painful Urination, Pelvic Pain and Urgency. Musculoskeletal Present- Joint Stiffness. Not Present- Back Pain, Joint Pain, Muscle Pain, Muscle Weakness and Swelling of Extremities. Neurological Not Present- Decreased Memory, Fainting, Headaches, Numbness, Seizures, Tingling, Tremor, Trouble walking and Weakness. Psychiatric Present- Depression. Not Present- Anxiety, Bipolar, Change in Sleep Pattern, Fearful and Frequent crying. Endocrine Present- Hot flashes and New Diabetes. Not Present- Cold Intolerance, Excessive Hunger, Hair Changes and Heat Intolerance. Hematology Not Present- Easy Bruising, Excessive bleeding, Gland problems, HIV and Persistent Infections.   Vitals (Sonya Bynum CMA; 08/25/2014 8:50 AM) 08/25/2014 8:50 AM Weight: 203 lb Height: 62in Body Surface Area: 2.01 m Body Mass Index: 37.13 kg/m Temp.: 97.75F(Temporal)  Pulse: 88 (Regular)  BP: 126/78 (Sitting, Left Arm, Standard)    Physical Exam Randall Hiss M. Brian Zeitlin MD; 08/25/2014 9:34 AM) General Mental Status-Alert. General Appearance-Consistent with stated age. Hydration-Well hydrated. Voice-Normal.  Head and Neck Head-normocephalic, atraumatic with no lesions or palpable masses. Trachea-midline. Thyroid Gland Characteristics - normal size and consistency.  Eye Eyeball - Bilateral-Extraocular movements intact. Sclera/Conjunctiva - Bilateral-No scleral  icterus.  Chest and Lung Exam Chest and lung exam reveals -quiet, even and easy respiratory effort with no use of accessory muscles and on auscultation, normal breath sounds, no adventitious sounds and normal vocal resonance. Inspection Chest Wall - Normal. Back - normal.  Breast - Did not examine.  Cardiovascular Cardiovascular examination reveals -normal heart sounds, regular rate and rhythm with no murmurs and normal pedal pulses bilaterally.  Abdomen Inspection Inspection of the abdomen reveals - No Hernias. Skin - Scar - no surgical scars. Palpation/Percussion Palpation and Percussion of the abdomen reveal - Soft, Non Tender, No Rebound tenderness, No Rigidity (guarding) and No hepatosplenomegaly. Auscultation Auscultation of the abdomen reveals - Bowel sounds normal.  Peripheral Vascular Upper Extremity Palpation - Pulses bilaterally normal.  Neurologic Neurologic evaluation reveals -alert and oriented x 3 with no impairment of recent or remote memory. Mental Status-Normal.  Neuropsychiatric The patient's mood and affect are described as -normal. Judgment and Insight-insight is appropriate concerning matters relevant to self.  Musculoskeletal Normal Exam - Left-Upper Extremity Strength Normal and Lower Extremity Strength Normal. Normal Exam - Right-Upper Extremity Strength Normal and Lower Extremity Strength Normal.  Lymphatic Head & Neck  General Head & Neck Lymphatics: Bilateral - Description - Normal. Axillary - Did not examine. Femoral & Inguinal - Did not examine.    Assessment & Plan Randall Hiss M. Theran Vandergrift MD; 08/25/2014 9:38 AM) SYMPTOMATIC CHOLELITHIASIS (574.20  K80.20) Impression: I do think her symptoms were due to gallbladder disease given the mild elevation of her transaminases as well as the CT finding that night at [facility]. Current Plans  I believe the patient's symptoms are consistent with gallbladder disease.  We discussed  gallbladder disease. The patient was given Neurosurgeon. We discussed non-operative and operative management. We discussed the signs & symptoms of acute cholecystitis  I discussed laparoscopic cholecystectomy with IOC in detail. The patient was given educational material as well as diagrams detailing the procedure. We discussed the risks and benefits of a laparoscopic cholecystectomy including, but not limited to bleeding, infection, injury to surrounding structures such as the intestine or liver, bile leak, retained gallstones, need to convert to an open procedure,  prolonged diarrhea, blood clots such as DVT, common bile duct injury, anesthesia risks, and possible need for additional procedures. We discussed the typical post-operative recovery course. I explained that the likelihood of improvement of their symptoms is good.  The patient has elected to proceed with surgery.  Our office will contact you to schedule surgery once we received clearance from your primary care physician's office Schedule for Surgery Provided with written educational materials HISTORY OF PULMONARY EMBOLISM (V12.55  Z86.711) Impression: Because of her history of multiple PEs and swell as DVTs and family history of blood clots I explained that she is at higher risk for bleeding as well as perioperative blood clots. I advised her that we would need to contact her primary care physician to get perioperative recommendations regarding her anticoagulation. She will most likely need a Lovenox bridge around the time of surgery while stopping the xarelto several days prior to surgery. I advised her that our office will contact her to schedule surgery once we received perioperative anticoagulation management from her PCP DIABETES MELLITUS, CONTROLLED (250.00  E11.9) ANTICOAGULATED BY ANTICOAGULATION TREATMENT (V58.61  Z79.01)  Leighton Ruff. Redmond Pulling, MD, FACS General, Bariatric, & Minimally Invasive Surgery [facility]  [facility], [place]

## 2014-09-15 NOTE — Anesthesia Procedure Notes (Signed)
Procedure Name: Intubation Date/Time: 09/15/2014 1:50 PM Performed by: Gershon Mussel, Valita Righter Pre-anesthesia Checklist: Patient identified, Patient being monitored, Timeout performed, Emergency Drugs available and Suction available Patient Re-evaluated:Patient Re-evaluated prior to inductionOxygen Delivery Method: Circle System Utilized Preoxygenation: Pre-oxygenation with 100% oxygen Intubation Type: IV induction Ventilation: Mask ventilation without difficulty Laryngoscope Size: Miller and 2 Grade View: Grade I Tube type: Oral Tube size: 7.0 mm Number of attempts: 1 Airway Equipment and Method: Stylet Placement Confirmation: ETT inserted through vocal cords under direct vision,  positive ETCO2 and breath sounds checked- equal and bilateral Secured at: 21 cm Tube secured with: Tape Dental Injury: Teeth and Oropharynx as per pre-operative assessment

## 2014-09-15 NOTE — Interval H&P Note (Signed)
History and Physical Interval Note:  09/15/2014 1:30 PM  Samantha Maynard  has presented today for surgery, with the diagnosis of symptomatic cholelithiasis  The various methods of treatment have been discussed with the patient and family. After consideration of risks, benefits and other options for treatment, the patient has consented to  Procedure(s): LAPAROSCOPIC CHOLECYSTECTOMY WITH POSSIBLE  INTRAOPERATIVE CHOLANGIOGRAM (N/A) as a surgical intervention .  The patient's history has been reviewed, patient examined, no change in status, stable for surgery.  I have reviewed the patient's chart and labs.  Questions were answered to the patient's satisfaction.    Leighton Ruff. Redmond Pulling, MD, [place], Bariatric, & Minimally Invasive Surgery [facility], [place]   [facility]

## 2014-09-15 NOTE — Anesthesia Postprocedure Evaluation (Signed)
  Anesthesia Post-op Note  Patient: Samantha Maynard  Procedure(s) Performed: Procedure(s): LAPAROSCOPIC CHOLECYSTECTOMY WITH  INTRAOPERATIVE CHOLANGIOGRAM (N/A)  Patient Location: PACU  Anesthesia Type:General  Level of Consciousness: awake, alert , oriented and patient cooperative  Airway and Oxygen Therapy: Patient Spontanous Breathing and Patient connected to nasal cannula oxygen  Post-op Pain: 3 /10, mild  Post-op Assessment: Post-op Vital signs reviewed, Patient's Cardiovascular Status Stable, Respiratory Function Stable, Patent Airway, No signs of Nausea or vomiting and Pain level controlled  Post-op Vital Signs: Reviewed and stable  Last Vitals:  Filed Vitals:   09/15/14 1706  BP: 149/84  Pulse: 52  Temp: 36.8 C  Resp: 16    Complications: No apparent anesthesia complications

## 2014-09-15 NOTE — Anesthesia Preprocedure Evaluation (Signed)
Anesthesia Evaluation  Patient identified by MRN, date of birth, ID band Patient awake    Reviewed: Allergy & Precautions, NPO status , Patient's Chart, lab work & pertinent test results  History of Anesthesia Complications Negative for: history of anesthetic complications  Airway Mallampati: II  TM Distance: >3 FB Neck ROM: Full    Dental  (+) Teeth Intact   Pulmonary neg pulmonary ROS,  breath sounds clear to auscultation        Cardiovascular negative cardio ROS  Rhythm:Regular     Neuro/Psych PSYCHIATRIC DISORDERS Anxiety Depression negative neurological ROS     GI/Hepatic negative GI ROS, Neg liver ROS,   Endo/Other  diabetes, Type 2, Oral Hypoglycemic AgentsMorbid obesity  Renal/GU      Musculoskeletal   Abdominal   Peds  Hematology negative hematology ROS (+)   Anesthesia Other Findings   Reproductive/Obstetrics                             Anesthesia Physical Anesthesia Plan  ASA: II  Anesthesia Plan: General   Post-op Pain Management:    Induction: Intravenous  Airway Management Planned: Oral ETT  Additional Equipment: None  Intra-op Plan:   Post-operative Plan: Extubation in OR  Informed Consent: I have reviewed the patients History and Physical, chart, labs and discussed the procedure including the risks, benefits and alternatives for the proposed anesthesia with the patient or authorized representative who has indicated his/her understanding and acceptance.   Dental advisory given  Plan Discussed with: CRNA and Surgeon  Anesthesia Plan Comments:         Anesthesia Quick Evaluation

## 2014-09-16 ENCOUNTER — Encounter (HOSPITAL_COMMUNITY): Payer: Self-pay | Admitting: General Surgery

## 2014-09-16 DIAGNOSIS — K8010 Calculus of gallbladder with chronic cholecystitis without obstruction: Secondary | ICD-10-CM | POA: Diagnosis not present

## 2014-09-16 LAB — GLUCOSE, CAPILLARY: Glucose-Capillary: 195 mg/dL — ABNORMAL HIGH (ref 70–99)

## 2014-09-16 LAB — CBC
HCT: 41.2 % (ref 36.0–46.0)
Hemoglobin: 13.3 g/dL (ref 12.0–15.0)
MCH: 29 pg (ref 26.0–34.0)
MCHC: 32.3 g/dL (ref 30.0–36.0)
MCV: 89.8 fL (ref 78.0–100.0)
Platelets: 318 10*3/uL (ref 150–400)
RBC: 4.59 MIL/uL (ref 3.87–5.11)
RDW: 15.1 % (ref 11.5–15.5)
WBC: 9.8 10*3/uL (ref 4.0–10.5)

## 2014-09-16 MED ORDER — OXYCODONE-ACETAMINOPHEN 5-325 MG PO TABS: 1.0000 | ORAL_TABLET | ORAL | Status: DC | PRN

## 2014-09-16 NOTE — Discharge Summary (Signed)
Physician Discharge Summary  Lyah Millirons NWG:[id number] DOB: Aug 20, 1960 DOA: 09/15/2014  PCP: Wenda Low, MD  Admit date: 09/15/2014 Discharge date: 09/16/2014  Recommendations for Outpatient Follow-up:    Follow-up Information    Follow up with Gayland Curry, MD On 10/06/2014.   Specialty:  General Surgery   Why:  8:30 AM (arrive 8:15am), For wound re-check   Contact information:   [address] [place] [address] [phone]       Follow up with Wenda Low, MD. Schedule an appointment as soon as possible for a visit in 5 days.   Specialty:  Internal Medicine   Why:  for blood work, CBC   Contact information:   [address] [place] [address] [phone]      Discharge Diagnoses:  1. Chronic calculous cholecystitis 2. H/o blood clots 3. Depression 4. GERD 5. DM 2  Surgical Procedure: Laparoscopic cholecystectomy  Discharge Condition: good  Disposition: home  Diet recommendation: regular  Filed Weights   09/15/14 1131  Weight: 92.67 kg (204 lb 4.8 oz)    History of present illness: The patient is a 54 year old female who presents for evaluation of gall stones. She is referred by Dr Lysle Rubens for evaluation of epigastric pain. She states about a month ago after eating dinner she had acute onset of sharp epigastric pain radiating under both ribs to her back. She initially thought she was having a heart attack. She went to [facility]. A heart attack was ruled out with enzymes and EKG. Labs were essentially normal except for mild elevation of her AST and ALT of 45 and 43 respectively. She underwent a CT scan which demonstrated mild intrahepatic ductal dilatation with some mild pericholecystic fluid and a hiatal hernia. She was discharged. She followed up with her primary care physician who ordered an ultrasound of her abdomen which showed multiple gallstones without any pericholecystic fluid. Her common bile  duct was normal. She has not had any additional tacks since then. She did have nausea with that first episode. She denies any weight loss. She denies any jaundice. She denies any NSAID use. She denies any trouble swallowing foods. 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 She had bilateral PEs diagnosed in March 2015. She was hospitalized to [facility]. An echocardiogram from March 2015 showed normal wall motion, normal ejection fraction, mild grade 1 diastolic dysfunction. She sees Dr. Marin Olp for iron deficiency anemia   Hospital Course:  She was brought in for planned lap cholecystectomy. We received preop clearance and anticoagulation guidelines from her PCP. Her last dose of xarelto was on Friday. She started 100mg  subcu injections of lovenox on Monday twice. Her last dose was Tuesday am. She stated she purposefully didn't give herself her Tuesday pm dosage as requested - tired of injection. Preoperatively she did receive 5000 unites of subcu heparin. Postoperatively she did well. Prophylactic subcu heparin was restarted 8 hrs after surgery. On POD 1 she was tolerating a diet, pain controlled, vital stable, ambulating. She was deemed safe for discharge. Her hgb was stable. I instructed her to resume her xarelto this evening.   BP 125/74 mmHg  Pulse 83  Temp(Src) 98.1 F (36.7 C) (Oral)  Resp 16  Ht 5\' 2"  (1.575 m)  Wt 92.67 kg (204 lb 4.8 oz)  BMI 37.36 kg/m2  SpO2 98%  LMP 06/04/2014  Gen: alert, NAD, non-toxic appearing Pupils: equal, no  scleral icterus Pulm: Lungs clear to auscultation, symmetric chest rise CV: regular rate and rhythm Abd: soft, nondistended. approp TTP. No cellulitis. No incisional hernia Ext: no edema, no calf tenderness Skin: no rash, no jaundice    Discharge Instructions  Discharge Instructions    Diet - low sodium heart healthy    Complete by:  As directed      Discharge instructions     Complete by:  As directed   See [facility] discharge instructions RESUME XARELTO THIS EVENING DO NOT TAKE LOVENOX GET CBC CHECKED Monday AT YOUR PCP OFFICE     Increase activity slowly    Complete by:  As directed             Medication List    STOP taking these medications        enoxaparin 100 MG/ML injection  Commonly known as:  LOVENOX      TAKE these medications        acetaminophen 325 MG tablet  Commonly known as:  TYLENOL  Take 325 mg by mouth every 6 (six) hours as needed for mild pain or moderate pain.     cetirizine 10 MG tablet  Commonly known as:  ZYRTEC  Take 10 mg by mouth daily as needed for allergies or rhinitis.     clonazePAM 1 MG tablet  Commonly known as:  KLONOPIN  Take 1 mg by mouth at bedtime as needed (sleep).     glucosamine-chondroitin 500-400 MG tablet  Take 1 tablet by mouth daily as needed (knee pain).     IRON 100/C PO  Take by mouth every morning.     lisdexamfetamine 70 MG capsule  Commonly known as:  VYVANSE  Take 70 mg by mouth daily.     metFORMIN 500 MG tablet  Commonly known as:  GLUCOPHAGE  Take 500 mg by mouth 2 (two) times daily with a meal.     multivitamin with minerals Tabs tablet  Take 1 tablet by mouth daily.     oxyCODONE-acetaminophen 5-325 MG per tablet  Commonly known as:  PERCOCET/ROXICET  Take 1-2 tablets by mouth every 4 (four) hours as needed for severe pain.     senna 8.6 MG Tabs tablet  Commonly known as:  SENOKOT  Take 2 tablets by mouth at bedtime as needed for mild constipation.     venlafaxine XR 150 MG 24 hr capsule  Commonly known as:  EFFEXOR-XR  Take 150 mg by mouth daily at 6 PM.     venlafaxine XR 75 MG 24 hr capsule  Commonly known as:  EFFEXOR-XR  Take 75 mg by mouth daily at 6 PM.     XARELTO 20 MG Tabs tablet  Generic drug:  rivaroxaban  Take 20 mg by mouth daily with supper.           Follow-up Information    Follow up with Gayland Curry, MD On 10/06/2014.   Specialty:   General Surgery   Why:  8:30 AM (arrive 8:15am), For wound re-check   Contact information:   [address] [place] [address] [phone]       Follow up with Wenda Low, MD. Schedule an appointment as soon as possible for a visit in 5 days.   Specialty:  Internal Medicine   Why:  for blood work, CBC   Contact information:   [address] [place] [address] [phone]        The results of significant  diagnostics from this hospitalization (including imaging, microbiology, ancillary and laboratory) are listed below for reference.    Significant Diagnostic Studies: Dg Cholangiogram Operative  09/15/2014   CLINICAL DATA:  Attempted but unsuccessful laparoscopic cholecystectomy.  EXAM: INTRAOPERATIVE CHOLANGIOGRAM  FLUOROSCOPY TIME:  9 seconds  COMPARISON:  Abdominal ultrasound - 09/03/2014  FINDINGS: Intraoperative angiographic images of the right upper abdominal quadrant during laparoscopic cholecystectomy are provided for review.  Surgical clips overlie the expected location of the gallbladder fossa.  Contrast injection demonstrates extravasation of contrast about the expected location of the gallbladder fossa with contrast pulling within the right upper abdominal quadrant.  There is no definitive opacification of any portion of the biliary tree.  IMPRESSION: Nondiagnostic attempted intraoperative cholangiogram.   Electronically Signed   By: Sandi Mariscal M.D.   On: 09/15/2014 15:27   US Abdomen Limited Ruq  09/03/2014   CLINICAL DATA:  Right upper quadrant pain since 1 p.m.  EXAM: US ABDOMEN LIMITED - RIGHT UPPER QUADRANT  COMPARISON:  None.  FINDINGS: Gallbladder:  Multiple layering gallstones measuring up to 11 mm. No gallbladder wall thickening or pericholecystic fluid. Negative sonographic Murphy's sign.  Common bile duct:  Diameter: 3 mm  Liver:  Hyperechoic hepatic parenchyma, suggesting hepatic steatosis. No focal hepatic lesion is seen.   IMPRESSION: Cholelithiasis, without associated sonographic findings to suggest acute cholecystitis.  Hepatic steatosis.   Electronically Signed   By: Julian Hy M.D.   On: 09/03/2014 16:20    Microbiology: No results found for this or any previous visit (from the past 240 hour(s)).   Labs: Basic Metabolic Panel: No results for input(s): NA, K, CL, CO2, GLUCOSE, BUN, CREATININE, CALCIUM, MG, PHOS in the last 168 hours. Liver Function Tests: No results for input(s): AST, ALT, ALKPHOS, BILITOT, PROT, ALBUMIN in the last 168 hours. No results for input(s): LIPASE, AMYLASE in the last 168 hours. No results for input(s): AMMONIA in the last 168 hours. CBC:  Recent Labs Lab 09/16/14 0430  WBC 9.8  HGB 13.3  HCT 41.2  MCV 89.8  PLT 318   Cardiac Enzymes: No results for input(s): CKTOTAL, CKMB, CKMBINDEX, TROPONINI in the last 168 hours. BNP: BNP (last 3 results) No results for input(s): BNP in the last 8760 hours.  ProBNP (last 3 results) No results for input(s): PROBNP in the last 8760 hours.  CBG:  Recent Labs Lab 09/15/14 1135 09/15/14 1543 09/15/14 1706 09/16/14 0740  GLUCAP 111* 130* 143* 195*    Active Problems:   S/P laparoscopic cholecystectomy   Time coordinating discharge: 15 minutes  Signed:  Gayland Curry, MD [facility], [place] [phone] 09/16/2014, 9:19 AM

## 2014-09-16 NOTE — Progress Notes (Signed)
AVS discharge instructions were reviewed with patient. Patient was also given prescription for percocet to take to her pharmacy. Patient stated that she did not have any questions . Will call volunteers to assist patient to her transportation.

## 2014-09-16 NOTE — Progress Notes (Signed)
UR completed 

## 2014-10-28 ENCOUNTER — Ambulatory Visit (HOSPITAL_BASED_OUTPATIENT_CLINIC_OR_DEPARTMENT_OTHER): Payer: Federal, State, Local not specified - PPO | Admitting: Hematology & Oncology

## 2014-10-28 ENCOUNTER — Other Ambulatory Visit (HOSPITAL_BASED_OUTPATIENT_CLINIC_OR_DEPARTMENT_OTHER): Payer: Federal, State, Local not specified - PPO

## 2014-10-28 VITALS — BP 144/95 | HR 89 | Temp 98.0°F | Wt 210.0 lb

## 2014-10-28 DIAGNOSIS — K909 Intestinal malabsorption, unspecified: Secondary | ICD-10-CM

## 2014-10-28 DIAGNOSIS — D509 Iron deficiency anemia, unspecified: Secondary | ICD-10-CM | POA: Diagnosis not present

## 2014-10-28 LAB — CBC WITH DIFFERENTIAL (CANCER CENTER ONLY)
BASO#: 0 10*3/uL (ref 0.0–0.2)
BASO%: 0.2 % (ref 0.0–2.0)
EOS%: 0 % (ref 0.0–7.0)
Eosinophils Absolute: 0 10*3/uL (ref 0.0–0.5)
HCT: 42.3 % (ref 34.8–46.6)
HGB: 14.1 g/dL (ref 11.6–15.9)
LYMPH#: 1.7 10*3/uL (ref 0.9–3.3)
LYMPH%: 30.5 % (ref 14.0–48.0)
MCH: 29.2 pg (ref 26.0–34.0)
MCHC: 33.3 g/dL (ref 32.0–36.0)
MCV: 88 fL (ref 81–101)
MONO#: 0.4 10*3/uL (ref 0.1–0.9)
MONO%: 7.7 % (ref 0.0–13.0)
NEUT#: 3.4 10*3/uL (ref 1.5–6.5)
NEUT%: 61.6 % (ref 39.6–80.0)
Platelets: 259 10*3/uL (ref 145–400)
RBC: 4.83 10*6/uL (ref 3.70–5.32)
RDW: 13.3 % (ref 11.1–15.7)
WBC: 5.6 10*3/uL (ref 3.9–10.0)

## 2014-10-28 LAB — IRON AND TIBC CHCC
%SAT: 13 % — ABNORMAL LOW (ref 21–57)
Iron: 42 ug/dL (ref 41–142)
TIBC: 326 ug/dL (ref 236–444)
UIBC: 284 ug/dL (ref 120–384)

## 2014-10-28 LAB — FERRITIN CHCC: Ferritin: 12 ng/ml (ref 9–269)

## 2014-10-28 NOTE — Progress Notes (Signed)
Hematology and Oncology Follow Up Visit  Samantha Maynard [id number] 10-15-1960 54 y.o. 10/28/2014   Principle Diagnosis:   Iron deficiency anemia  Recurrent pulmonary emboli  Current Therapy:   IV iron as indicated  Xarelto 20 mg by mouth daily-lifelong     Interim History:  [name] is back for followup. She is doing quite well. She actually had her gallbladder taken out in April. She had no problems with bleeding or with clotting. She was placed on the Lovenox before the surgery. She was then placed onto Xarelto after surgery.  She got iron back in April. This helped her also. She also got a new dog. This was a rescue collie. This really has maybe difference for her and her husband.  She is still working. She is busy at work.  She's had no bleeding. She's had no leg pain or swelling. She's had no cough or shortness of breath.   Overall, her performance status is ECOG 1   Medications:  Current outpatient prescriptions:  .  acetaminophen (TYLENOL) 325 MG tablet, Take 325 mg by mouth every 6 (six) hours as needed for mild pain or moderate pain., Disp: , Rfl:  .  cetirizine (ZYRTEC) 10 MG tablet, Take 10 mg by mouth daily as needed for allergies or rhinitis., Disp: , Rfl:  .  clonazePAM (KLONOPIN) 1 MG tablet, Take 1 mg by mouth at bedtime as needed (sleep). , Disp: , Rfl:  .  glucosamine-chondroitin 500-400 MG tablet, Take 1 tablet by mouth daily as needed (knee pain). , Disp: , Rfl:  .  Iron-Vitamin C (IRON 100/C PO), Take by mouth every morning., Disp: , Rfl:  .  lisdexamfetamine (VYVANSE) 70 MG capsule, Take 70 mg by mouth daily., Disp: , Rfl:  .  metFORMIN (GLUCOPHAGE) 500 MG tablet, Take 500 mg by mouth 2 (two) times daily with a meal. , Disp: , Rfl:  .  Multiple Vitamin (MULTIVITAMIN WITH MINERALS) TABS tablet, Take 1 tablet by mouth daily., Disp: , Rfl:  .  rivaroxaban (XARELTO) 20 MG TABS tablet, Take 20 mg by mouth daily with supper., Disp: , Rfl:  .  senna (SENOKOT) 8.6  MG TABS tablet, Take 2 tablets by mouth at bedtime as needed for mild constipation. , Disp: , Rfl:  .  venlafaxine XR (EFFEXOR-XR) 150 MG 24 hr capsule, Take 150 mg by mouth daily at 6 PM. , Disp: , Rfl:  .  venlafaxine XR (EFFEXOR-XR) 75 MG 24 hr capsule, Take 75 mg by mouth daily at 6 PM., Disp: , Rfl:   Allergies: No Known Allergies  Past Medical History, Surgical history, Social history, and Family History were reviewed and updated.  Review of Systems: As above  Physical Exam:  weight is 210 lb (95.255 kg). Her oral temperature is 98 F (36.7 C). Her blood pressure is 144/95 and her pulse is 89.   Well-developed and well-nourished white female. Head and neck exam shows no ocular or oral lesions. There are no palpable cervical or supraclavicular lymph nodes. Lungs are clear bilaterally. She has no rales wheezes or rhonchi. Cardiac exam regular rate and rhythm with no murmurs rubs or bruits. Abdomen is soft. She has good bowel sounds. There is no fluid wave is no palpable liver or spleen tip. Back exam shows no tenderness over the spine ribs or hips. Extremities shows no clubbing cyanosis or edema. Skin exam no rashes, ecchymosis or petechia.neurological exam is nonfocal.  Lab Results  Component Value Date   WBC  5.6 10/28/2014   HGB 14.1 10/28/2014   HCT 42.3 10/28/2014   MCV 88 10/28/2014   PLT 259 10/28/2014     Chemistry      Component Value Date/Time   NA 140 09/03/2014 1500   K 4.4 09/03/2014 1500   CL 103 09/03/2014 1500   CO2 29 09/03/2014 1500   BUN 16 09/03/2014 1500   CREATININE 0.86 09/03/2014 1500      Component Value Date/Time   CALCIUM 9.2 09/03/2014 1500   ALKPHOS 146* 09/03/2014 1500   AST 52* 09/03/2014 1500   ALT 31 09/03/2014 1500   BILITOT 0.5 09/03/2014 1500       Impression and Plan: Samantha Maynard is 54 year old white female with iron deficiency anemia. She had marked iron deficiency. She has improved nicely. Her hemoglobin has stayed up in a normal  level. Her MCV is 88 which is wonderful.  I think that we can get her back now in 4 months. She last got iron back in April. This should hold her over until we see her back.  I'm glad that she got her gallbladder taken out. This also was taking out in April. She feels a lot better.  Samantha Napoleon, MD 6/9/201610:53 AM

## 2015-02-25 ENCOUNTER — Ambulatory Visit (HOSPITAL_BASED_OUTPATIENT_CLINIC_OR_DEPARTMENT_OTHER): Payer: Federal, State, Local not specified - PPO | Admitting: Hematology & Oncology

## 2015-02-25 ENCOUNTER — Encounter: Payer: Self-pay | Admitting: Hematology & Oncology

## 2015-02-25 ENCOUNTER — Other Ambulatory Visit (HOSPITAL_BASED_OUTPATIENT_CLINIC_OR_DEPARTMENT_OTHER): Payer: Federal, State, Local not specified - PPO

## 2015-02-25 VITALS — BP 150/79 | HR 115 | Temp 98.8°F | Resp 16 | Ht 62.0 in | Wt 224.0 lb

## 2015-02-25 DIAGNOSIS — I2699 Other pulmonary embolism without acute cor pulmonale: Secondary | ICD-10-CM | POA: Diagnosis not present

## 2015-02-25 DIAGNOSIS — D509 Iron deficiency anemia, unspecified: Secondary | ICD-10-CM

## 2015-02-25 DIAGNOSIS — K909 Intestinal malabsorption, unspecified: Secondary | ICD-10-CM

## 2015-02-25 LAB — CBC WITH DIFFERENTIAL (CANCER CENTER ONLY)
BASO#: 0 10*3/uL (ref 0.0–0.2)
BASO%: 0.2 % (ref 0.0–2.0)
EOS%: 0 % (ref 0.0–7.0)
Eosinophils Absolute: 0 10*3/uL (ref 0.0–0.5)
HCT: 33.7 % — ABNORMAL LOW (ref 34.8–46.6)
HGB: 10.2 g/dL — ABNORMAL LOW (ref 11.6–15.9)
LYMPH#: 1.4 10*3/uL (ref 0.9–3.3)
LYMPH%: 22 % (ref 14.0–48.0)
MCH: 24.2 pg — ABNORMAL LOW (ref 26.0–34.0)
MCHC: 30.3 g/dL — ABNORMAL LOW (ref 32.0–36.0)
MCV: 80 fL — ABNORMAL LOW (ref 81–101)
MONO#: 0.6 10*3/uL (ref 0.1–0.9)
MONO%: 10.3 % (ref 0.0–13.0)
NEUT#: 4.2 10*3/uL (ref 1.5–6.5)
NEUT%: 67.5 % (ref 39.6–80.0)
Platelets: 366 10*3/uL (ref 145–400)
RBC: 4.22 10*6/uL (ref 3.70–5.32)
RDW: 15.3 % (ref 11.1–15.7)
WBC: 6.1 10*3/uL (ref 3.9–10.0)

## 2015-02-25 LAB — CMP (CANCER CENTER ONLY)
ALT(SGPT): 27 U/L (ref 10–47)
AST: 29 U/L (ref 11–38)
Albumin: 3.4 g/dL (ref 3.3–5.5)
Alkaline Phosphatase: 156 U/L — ABNORMAL HIGH (ref 26–84)
BUN, Bld: 13 mg/dL (ref 7–22)
CO2: 28 mEq/L (ref 18–33)
Calcium: 9 mg/dL (ref 8.0–10.3)
Chloride: 101 mEq/L (ref 98–108)
Creat: 1.1 mg/dl (ref 0.6–1.2)
Glucose, Bld: 171 mg/dL — ABNORMAL HIGH (ref 73–118)
Potassium: 3.7 mEq/L (ref 3.3–4.7)
Sodium: 134 mEq/L (ref 128–145)
Total Bilirubin: 0.3 mg/dl (ref 0.20–1.60)
Total Protein: 7.6 g/dL (ref 6.4–8.1)

## 2015-02-25 LAB — IRON AND TIBC CHCC
%SAT: 5 % — ABNORMAL LOW (ref 21–57)
Iron: 21 ug/dL — ABNORMAL LOW (ref 41–142)
TIBC: 408 ug/dL (ref 236–444)
UIBC: 387 ug/dL — ABNORMAL HIGH (ref 120–384)

## 2015-02-25 LAB — FERRITIN CHCC: Ferritin: 9 ng/ml (ref 9–269)

## 2015-02-25 NOTE — Progress Notes (Signed)
Hematology and Oncology Follow Up Visit  Mikelle Myrick [id number] [birth year] 54 y.o. 02/25/2015   Principle Diagnosis:   Iron deficiency anemia  Recurrent pulmonary emboli  Current Therapy:   IV iron as indicated  Xarelto 20 mg by mouth daily-lifelong     Interim History:  Ms.  Linch is back for followup. She is feeling a little tired. She has not had iron probably for about 6 months. We saw her back in June, her iron studies were borderline.  She's had no bleeding.  She is on Xarelto and doing well. She's having no problems with cough or shortness of breath.  She's had no change in bowel or bladder habits.  She's had issues with her blood sugars. She is trying to be aggressive with managing this.  She is due for a mammogram this month or next month.  She is still working.  Medications:  Current outpatient prescriptions:  .  acetaminophen (TYLENOL) 325 MG tablet, Take 325 mg by mouth every 6 (six) hours as needed for mild pain or moderate pain., Disp: , Rfl:  .  cetirizine (ZYRTEC) 10 MG tablet, Take 10 mg by mouth daily as needed for allergies or rhinitis., Disp: , Rfl:  .  clonazePAM (KLONOPIN) 1 MG tablet, Take 1 mg by mouth at bedtime as needed (sleep). , Disp: , Rfl:  .  glucosamine-chondroitin 500-400 MG tablet, Take 1 tablet by mouth daily as needed (knee pain). , Disp: , Rfl:  .  Iron-Vitamin C (IRON 100/C PO), Take by mouth every morning., Disp: , Rfl:  .  lisdexamfetamine (VYVANSE) 70 MG capsule, Take 70 mg by mouth daily., Disp: , Rfl:  .  metFORMIN (GLUCOPHAGE) 500 MG tablet, Take 500 mg by mouth 2 (two) times daily with a meal. , Disp: , Rfl:  .  Multiple Vitamin (MULTIVITAMIN WITH MINERALS) TABS tablet, Take 1 tablet by mouth daily., Disp: , Rfl:  .  rivaroxaban (XARELTO) 20 MG TABS tablet, Take 20 mg by mouth daily with supper., Disp: , Rfl:  .  senna (SENOKOT) 8.6 MG TABS tablet, Take 2 tablets by mouth at bedtime as needed for mild constipation. , Disp: , Rfl:   .  venlafaxine XR (EFFEXOR-XR) 150 MG 24 hr capsule, Take 150 mg by mouth daily at 6 PM. , Disp: , Rfl:  .  venlafaxine XR (EFFEXOR-XR) 75 MG 24 hr capsule, Take 75 mg by mouth daily at 6 PM., Disp: , Rfl:   Allergies: No Known Allergies  Past Medical History, Surgical history, Social history, and Family History were reviewed and updated.  Review of Systems: As above  Physical Exam:  height is 5\' 2"  (1.575 m) and weight is 224 lb (101.606 kg). Her oral temperature is 98.8 F (37.1 C). Her blood pressure is 150/79 and her pulse is 115. Her respiration is 16.   Well-developed and well-nourished white female. Head and neck exam shows no ocular or oral lesions. There are no palpable cervical or supraclavicular lymph nodes. Lungs are clear bilaterally. She has no rales wheezes or rhonchi. Cardiac exam regular rate and rhythm with no murmurs rubs or bruits. Abdomen is soft. She has good bowel sounds. There is no fluid wave is no palpable liver or spleen tip. Back exam shows no tenderness over the spine ribs or hips. Extremities shows no clubbing cyanosis or edema. Skin exam no rashes, ecchymosis or petechia.neurological exam is nonfocal.  Lab Results  Component Value Date   WBC 6.1 02/25/2015   HGB 10.2* 02/25/2015  HCT 33.7* 02/25/2015   MCV 80* 02/25/2015   PLT 366 02/25/2015     Chemistry      Component Value Date/Time   NA 134 02/25/2015 0920   NA 140 09/03/2014 1500   K 3.7 02/25/2015 0920   K 4.4 09/03/2014 1500   CL 101 02/25/2015 0920   CL 103 09/03/2014 1500   CO2 28 02/25/2015 0920   CO2 29 09/03/2014 1500   BUN 13 02/25/2015 0920   BUN 16 09/03/2014 1500   CREATININE 1.1 02/25/2015 0920   CREATININE 0.86 09/03/2014 1500      Component Value Date/Time   CALCIUM 9.0 02/25/2015 0920   CALCIUM 9.2 09/03/2014 1500   ALKPHOS 156* 02/25/2015 0920   ALKPHOS 146* 09/03/2014 1500   AST 29 02/25/2015 0920   AST 52* 09/03/2014 1500   ALT 27 02/25/2015 0920   ALT 31  09/03/2014 1500   BILITOT 0.30 02/25/2015 0920   BILITOT 0.5 09/03/2014 1500       Impression and Plan: Ms. Villalona is 54 year old white female with iron deficiency anemia. She had marked iron deficiency. She responded to IV iron. I believe that she will clearly need IV iron. Back in June when she was here, her iron studies were borderline. She is feeling well.  Her MCV is much lower now. I think this is indicative of low iron. As such, we will go ahead and get the iron to her. I want make sure we get this done so she will enjoy the holidays.  We will do get her back to see [place] in 3 more months.    Volanda Napoleon, MD 10/7/201610:30 AM

## 2015-02-28 ENCOUNTER — Other Ambulatory Visit: Payer: Self-pay | Admitting: *Deleted

## 2015-02-28 ENCOUNTER — Ambulatory Visit (HOSPITAL_BASED_OUTPATIENT_CLINIC_OR_DEPARTMENT_OTHER): Payer: Federal, State, Local not specified - PPO

## 2015-02-28 ENCOUNTER — Telehealth: Payer: Self-pay | Admitting: *Deleted

## 2015-02-28 VITALS — BP 126/77 | HR 89 | Temp 98.1°F | Resp 18

## 2015-02-28 DIAGNOSIS — D509 Iron deficiency anemia, unspecified: Secondary | ICD-10-CM | POA: Diagnosis not present

## 2015-02-28 MED ORDER — SODIUM CHLORIDE 0.9 % IV SOLN
510.0000 mg | Freq: Once | INTRAVENOUS | Status: AC
  Administered 2015-02-28: 510 mg via INTRAVENOUS
  Filled 2015-02-28: qty 17

## 2015-02-28 MED ORDER — SODIUM CHLORIDE 0.9 % IV SOLN
INTRAVENOUS | Status: DC
  Administered 2015-02-28: 13:00:00 via INTRAVENOUS

## 2015-02-28 NOTE — Patient Instructions (Signed)

## 2015-02-28 NOTE — Telephone Encounter (Signed)
-----   Message from Volanda Napoleon, MD sent at 02/25/2015  5:35 PM EDT ----- Call - her iron level is very low!!  She will need another dose on IV iron in 2 weeks. Please set this up!!!  Laurey Arrow

## 2015-03-15 ENCOUNTER — Ambulatory Visit (HOSPITAL_BASED_OUTPATIENT_CLINIC_OR_DEPARTMENT_OTHER): Payer: Federal, State, Local not specified - PPO

## 2015-03-15 VITALS — BP 145/93 | HR 90 | Temp 98.0°F | Resp 18

## 2015-03-15 DIAGNOSIS — D509 Iron deficiency anemia, unspecified: Secondary | ICD-10-CM

## 2015-03-15 MED ORDER — SODIUM CHLORIDE 0.9 % IV SOLN
510.0000 mg | Freq: Once | INTRAVENOUS | Status: AC
  Administered 2015-03-15: 510 mg via INTRAVENOUS
  Filled 2015-03-15: qty 17

## 2015-03-15 MED ORDER — SODIUM CHLORIDE 0.9 % IV SOLN
INTRAVENOUS | Status: DC
  Administered 2015-03-15: 11:00:00 via INTRAVENOUS

## 2015-03-15 NOTE — Patient Instructions (Signed)

## 2015-06-03 ENCOUNTER — Encounter: Payer: Self-pay | Admitting: Family

## 2015-06-03 ENCOUNTER — Other Ambulatory Visit (HOSPITAL_BASED_OUTPATIENT_CLINIC_OR_DEPARTMENT_OTHER): Payer: Federal, State, Local not specified - PPO

## 2015-06-03 ENCOUNTER — Ambulatory Visit (HOSPITAL_BASED_OUTPATIENT_CLINIC_OR_DEPARTMENT_OTHER): Payer: Federal, State, Local not specified - PPO | Admitting: Family

## 2015-06-03 VITALS — BP 145/84 | HR 95 | Temp 98.1°F | Resp 16 | Ht 62.0 in | Wt 231.0 lb

## 2015-06-03 DIAGNOSIS — D509 Iron deficiency anemia, unspecified: Secondary | ICD-10-CM | POA: Diagnosis not present

## 2015-06-03 DIAGNOSIS — K909 Intestinal malabsorption, unspecified: Secondary | ICD-10-CM

## 2015-06-03 LAB — CMP (CANCER CENTER ONLY)
ALT(SGPT): 17 U/L (ref 10–47)
AST: 19 U/L (ref 11–38)
Albumin: 3.3 g/dL (ref 3.3–5.5)
Alkaline Phosphatase: 115 U/L — ABNORMAL HIGH (ref 26–84)
BUN, Bld: 17 mg/dL (ref 7–22)
CO2: 29 mEq/L (ref 18–33)
Calcium: 9.2 mg/dL (ref 8.0–10.3)
Chloride: 101 mEq/L (ref 98–108)
Creat: 0.9 mg/dl (ref 0.6–1.2)
Glucose, Bld: 126 mg/dL — ABNORMAL HIGH (ref 73–118)
Potassium: 4.2 mEq/L (ref 3.3–4.7)
Sodium: 143 mEq/L (ref 128–145)
Total Bilirubin: 0.5 mg/dl (ref 0.20–1.60)
Total Protein: 7.2 g/dL (ref 6.4–8.1)

## 2015-06-03 LAB — IRON AND TIBC
%SAT: 9 % — ABNORMAL LOW (ref 21–57)
Iron: 33 ug/dL — ABNORMAL LOW (ref 41–142)
TIBC: 359 ug/dL (ref 236–444)
UIBC: 326 ug/dL (ref 120–384)

## 2015-06-03 LAB — CBC WITH DIFFERENTIAL (CANCER CENTER ONLY)
BASO#: 0 10*3/uL (ref 0.0–0.2)
BASO%: 0 % (ref 0.0–2.0)
EOS%: 0 % (ref 0.0–7.0)
Eosinophils Absolute: 0 10*3/uL (ref 0.0–0.5)
HCT: 39.2 % (ref 34.8–46.6)
HGB: 12.4 g/dL (ref 11.6–15.9)
LYMPH#: 2.2 10*3/uL (ref 0.9–3.3)
LYMPH%: 34.8 % (ref 14.0–48.0)
MCH: 26.4 pg (ref 26.0–34.0)
MCHC: 31.6 g/dL — ABNORMAL LOW (ref 32.0–36.0)
MCV: 84 fL (ref 81–101)
MONO#: 0.6 10*3/uL (ref 0.1–0.9)
MONO%: 8.9 % (ref 0.0–13.0)
NEUT#: 3.6 10*3/uL (ref 1.5–6.5)
NEUT%: 56.3 % (ref 39.6–80.0)
Platelets: 381 10*3/uL (ref 145–400)
RBC: 4.69 10*6/uL (ref 3.70–5.32)
RDW: 16.7 % — ABNORMAL HIGH (ref 11.1–15.7)
WBC: 6.3 10*3/uL (ref 3.9–10.0)

## 2015-06-03 LAB — FERRITIN: Ferritin: 7 ng/ml — ABNORMAL LOW (ref 9–269)

## 2015-06-03 LAB — CHCC SATELLITE - SMEAR

## 2015-06-03 NOTE — Progress Notes (Signed)
Hematology and Oncology Follow Up Visit  Samantha Maynard [id number] 1960-07-22 55 y.o. 06/03/2015   Principle Diagnosis:  Iron deficiency anemia Recurrent pulmonary emboli  Current Therapy:   IV iron as indicated  Xarelto 20 mg by mouth daily - lifelong    Interim History:  Samantha Maynard is here today for a follow-up. She is doing well and feeling much better. She received 2 doses of Feraheme in October and has responded nicely. Her Hgb is now up to 12.4 with an MCV of 84.  She denies fatigue. No fever, chills, ice cravings, n/v, cough, rash, dizziness, SOB, chest pain, palpitations, abdominal pain or changes in bowel or bladder habits.  No swelling, tenderness, numbness or tingling in her extremities. No c/o joint aches or pains.  She is doing well on Xarelto and has had no episodes of bleeding, bruising or petechiae.  She has a good appetite and makes sure to stay well hydrated. Her weight is stable.   Medications:    Medication List       This list is accurate as of: 06/03/15 11:48 AM.  Always use your most recent med list.               acetaminophen 325 MG tablet  Commonly known as:  TYLENOL  Take 325 mg by mouth every 6 (six) hours as needed for mild pain or moderate pain.     cetirizine 10 MG tablet  Commonly known as:  ZYRTEC  Take 10 mg by mouth daily as needed for allergies or rhinitis.     clonazePAM 1 MG tablet  Commonly known as:  KLONOPIN  Take 1 mg by mouth at bedtime as needed (sleep).     glucosamine-chondroitin 500-400 MG tablet  Take 1 tablet by mouth daily as needed (knee pain).     IRON 100/C PO  Take by mouth every morning.     lisdexamfetamine 70 MG capsule  Commonly known as:  VYVANSE  Take 70 mg by mouth daily.     metFORMIN 500 MG tablet  Commonly known as:  GLUCOPHAGE  Take 500 mg by mouth 2 (two) times daily with a meal.     multivitamin with minerals Tabs tablet  Take 1 tablet by mouth daily.     senna 8.6 MG Tabs tablet  Commonly  known as:  SENOKOT  Take 2 tablets by mouth at bedtime as needed for mild constipation.     venlafaxine XR 150 MG 24 hr capsule  Commonly known as:  EFFEXOR-XR  Take 150 mg by mouth daily at 6 PM.     venlafaxine XR 75 MG 24 hr capsule  Commonly known as:  EFFEXOR-XR  Take 75 mg by mouth daily at 6 PM.     XARELTO 20 MG Tabs tablet  Generic drug:  rivaroxaban  Take 20 mg by mouth daily with supper.        Allergies: No Known Allergies  Past Medical History, Surgical history, Social history, and Family History were reviewed and updated.  Review of Systems: All other 10 point review of systems is negative.   Physical Exam:  height is 5\' 2"  (1.575 m) and weight is 231 lb (104.781 kg). Her oral temperature is 98.1 F (36.7 C). Her blood pressure is 145/84 and her pulse is 95. Her respiration is 16.   Wt Readings from Last 3 Encounters:  06/03/15 231 lb (104.781 kg)  02/25/15 224 lb (101.606 kg)  10/28/14 210 lb (95.255 kg)  Ocular: Sclerae unicteric, pupils equal, round and reactive to light Ear-nose-throat: Oropharynx clear, dentition fair Lymphatic: No cervical supraclavicular or axillary adenopathy Lungs no rales or rhonchi, good excursion bilaterally Heart regular rate and rhythm, no murmur appreciated Abd soft, nontender, positive bowel sounds, no liver or spleen tip palpated on exam MSK no focal spinal tenderness, no joint edema Neuro: non-focal, well-oriented, appropriate affect Breasts: Deferred  Lab Results  Component Value Date   WBC 6.3 06/03/2015   HGB 12.4 06/03/2015   HCT 39.2 06/03/2015   MCV 84 06/03/2015   PLT 381 06/03/2015   Lab Results  Component Value Date   FERRITIN 9 02/25/2015   IRON 21* 02/25/2015   TIBC 408 02/25/2015   UIBC 387* 02/25/2015   IRONPCTSAT 5* 02/25/2015   Lab Results  Component Value Date   RETICCTPCT 1.2 03/25/2014   RBC 4.69 06/03/2015   RETICCTABS 60.6 03/25/2014   No results found for: KPAFRELGTCHN,  LAMBDASER, KAPLAMBRATIO No results found for: IGGSERUM, IGA, IGMSERUM No results found for: Odetta Pink, SPEI   Chemistry      Component Value Date/Time   NA 134 02/25/2015 0920   NA 140 09/03/2014 1500   K 3.7 02/25/2015 0920   K 4.4 09/03/2014 1500   CL 101 02/25/2015 0920   CL 103 09/03/2014 1500   CO2 28 02/25/2015 0920   CO2 29 09/03/2014 1500   BUN 13 02/25/2015 0920   BUN 16 09/03/2014 1500   CREATININE 1.1 02/25/2015 0920   CREATININE 0.86 09/03/2014 1500      Component Value Date/Time   CALCIUM 9.0 02/25/2015 0920   CALCIUM 9.2 09/03/2014 1500   ALKPHOS 156* 02/25/2015 0920   ALKPHOS 146* 09/03/2014 1500   AST 29 02/25/2015 0920   AST 52* 09/03/2014 1500   ALT 27 02/25/2015 0920   ALT 31 09/03/2014 1500   BILITOT 0.30 02/25/2015 0920   BILITOT 0.5 09/03/2014 1500     Impression and Plan: Samantha Maynard is 55 yo white female with iron deficiency anemia. She has responded nicely to the 2 doses of Feraheme she received in October. She is doing well and is asymptomatic at this time.  We will see what her iron studies today show and bring her in next week for an infusion if needed.  We will plan to see her back in 3 months for labs and follow-up.  She will contact us with any questions or concerns. We can certainly see her sooner if need be.   Eliezer Bottom, NP 1/13/201711:48 AM

## 2015-06-04 LAB — RETICULOCYTES: Reticulocyte Count: 2.3 % (ref 0.6–2.6)

## 2015-06-08 ENCOUNTER — Telehealth: Payer: Self-pay | Admitting: *Deleted

## 2015-06-08 ENCOUNTER — Other Ambulatory Visit: Payer: Self-pay | Admitting: *Deleted

## 2015-06-08 DIAGNOSIS — D509 Iron deficiency anemia, unspecified: Secondary | ICD-10-CM

## 2015-06-08 MED ORDER — SODIUM CHLORIDE 0.9 % IV SOLN: Freq: Once | INTRAVENOUS | Status: DC

## 2015-06-08 MED ORDER — FERUMOXYTOL INJECTION 510 MG/17 ML
510.0000 mg | Freq: Once | INTRAVENOUS | Status: DC
  Filled 2015-06-08: qty 17

## 2015-06-08 NOTE — Telephone Encounter (Signed)
Called patient to let her know she needs a dose of Feraheme this week and again in 8 days per [facility].

## 2015-06-09 ENCOUNTER — Ambulatory Visit (HOSPITAL_BASED_OUTPATIENT_CLINIC_OR_DEPARTMENT_OTHER): Payer: Federal, State, Local not specified - PPO

## 2015-06-09 VITALS — BP 140/86 | HR 81 | Temp 97.4°F | Resp 18

## 2015-06-09 DIAGNOSIS — D509 Iron deficiency anemia, unspecified: Secondary | ICD-10-CM

## 2015-06-09 MED ORDER — SODIUM CHLORIDE 0.9 % IV SOLN
Freq: Once | INTRAVENOUS | Status: AC
  Administered 2015-06-09: 15:00:00 via INTRAVENOUS

## 2015-06-09 MED ORDER — SODIUM CHLORIDE 0.9 % IV SOLN
510.0000 mg | Freq: Once | INTRAVENOUS | Status: AC
  Administered 2015-06-09: 510 mg via INTRAVENOUS
  Filled 2015-06-09: qty 17

## 2015-06-09 NOTE — Patient Instructions (Signed)

## 2015-06-17 ENCOUNTER — Other Ambulatory Visit: Payer: Self-pay | Admitting: Family

## 2015-06-17 ENCOUNTER — Ambulatory Visit (HOSPITAL_BASED_OUTPATIENT_CLINIC_OR_DEPARTMENT_OTHER): Payer: Federal, State, Local not specified - PPO

## 2015-06-17 VITALS — BP 156/85 | HR 90 | Temp 98.1°F | Resp 18

## 2015-06-17 DIAGNOSIS — D509 Iron deficiency anemia, unspecified: Secondary | ICD-10-CM

## 2015-06-17 MED ORDER — SODIUM CHLORIDE 0.9 % IV SOLN
INTRAVENOUS | Status: DC
  Administered 2015-06-17: 14:00:00 via INTRAVENOUS

## 2015-06-17 MED ORDER — SODIUM CHLORIDE 0.9 % IV SOLN
510.0000 mg | Freq: Once | INTRAVENOUS | Status: AC
  Administered 2015-06-17: 510 mg via INTRAVENOUS
  Filled 2015-06-17: qty 17

## 2015-06-21 ENCOUNTER — Other Ambulatory Visit: Payer: Self-pay

## 2015-06-21 ENCOUNTER — Ambulatory Visit
Admission: RE | Admit: 2015-06-21 | Discharge: 2015-06-21 | Disposition: A | Discharge status: Home / Self Care | Payer: Federal, State, Local not specified - PPO | Source: Ambulatory Visit

## 2015-06-21 DIAGNOSIS — Z1231 Encounter for screening mammogram for malignant neoplasm of breast: Secondary | ICD-10-CM

## 2015-07-28 ENCOUNTER — Encounter: Payer: Federal, State, Local not specified - PPO | Attending: Internal Medicine | Admitting: Skilled Nursing Facility1

## 2015-07-28 ENCOUNTER — Encounter: Payer: Self-pay | Admitting: Skilled Nursing Facility1

## 2015-07-28 VITALS — Ht 62.0 in | Wt 231.0 lb

## 2015-07-28 DIAGNOSIS — E669 Obesity, unspecified: Secondary | ICD-10-CM | POA: Diagnosis present

## 2015-07-28 DIAGNOSIS — E119 Type 2 diabetes mellitus without complications: Secondary | ICD-10-CM

## 2015-07-28 NOTE — Progress Notes (Signed)
  Medical Nutrition Therapy:  Appt start time: 1000 end time:  1100.   Assessment:  Primary concerns today: referred for obesity. Pt states 2 years ago she had several blood clots in her lungs in the hospital with an A1C of 10. Pt states she has done weight watchers several different times. Pt states she had a gallbladder attack which led her to believe her healthy eating caused it. Pt states she works third shift 7pm-7am. Pt states her last A1C was 6.9. Pt states her bowel movements are regular. Pt states she has always had a problem with her sleep and ends up sleeping too many hours. Pt states she lives with her husband and they eat in front of the television.   Preferred Learning Style:   Auditory  Learning Readiness:   Contemplating  MEDICATIONS: See List   DIETARY INTAKE:  Usual eating pattern includes 3 meals and 2 snacks per day.  Everyday foods include none stated.  Avoided foods include none stated.    24-hr recall:  B ( AM): hot dogs----spagetti Snk ( AM): yogurt and banana   L ( PM): bagel and cream cheese Snk ( PM): none D ( PM): [place] sandwich Snk ( PM): pizza Beverages: coffee, juice, diet snapple, diet soda  Chic fila once a week  Usual physical activity: ADL's  Estimated energy needs: 1600 calories 180 g carbohydrates 120 g protein 44 g fat  Progress Towards Goal(s):  In progress.   Nutritional Diagnosis:  [place]-3.3 Overweight/obesity As related to overconsumption of calorically dense foods.  As evidenced by pt report, 24 hr recall, and BMI 42.1.    Intervention:  Nutrition counseling for diabetes/obesity. Dietitian educated the pt on carbohydrate controlled diet, balance, eating throughout the day, and the importance of physical activity. Goals: -3 meals a day and 2-3 snacks -A meal: carbohydrate, protein, vegetable -A snack: carbohydrate AND protein -Around 15 grams of carbohydrate for snack -30-45 grams of carbohydrate per meal -First thought, Am I  hungry: Second thought: does this have the components of a meal, Final thought: are there more non-starchy vegetables compared to the other 2 -Try not to go longer than 5 hours without eating -Plan out your grocery list -Slow small and steady changes completed over time for long term success  -Take the dog for walk, when the weather is not nice: gym/pilates   Teaching Method Utilized:  Visual Auditory Handouts given during visit include:  Snack sheet  Barriers to learning/adherence to lifestyle change: third shift  Demonstrated degree of understanding via:  Teach Back   Monitoring/Evaluation:  Dietary intake, exercise, A1C, and body weight prn.

## 2015-07-28 NOTE — Patient Instructions (Signed)
-  3 meals a day and 2-3 snacks -A meal: carbohydrate, protein, vegetable -A snack: carbohydrate AND protein -Around 15 grams of carbohydrate for snack -30-45 grams of carbohydrate per meal -First thought, Am I hungry: Second thought: does this have the components of a meal, Final thought: are there more non-starchy vegetables compared to the other 2 -Try not to go longer than 5 hours without eating -Plan out your grocery list -Slow small and steady changes completed over time for long term success  -Take the dog for walk, when the weather is not nice: gym/pilates

## 2015-09-02 ENCOUNTER — Ambulatory Visit (HOSPITAL_BASED_OUTPATIENT_CLINIC_OR_DEPARTMENT_OTHER): Payer: Federal, State, Local not specified - PPO | Admitting: Family

## 2015-09-02 ENCOUNTER — Encounter: Payer: Self-pay | Admitting: Family

## 2015-09-02 ENCOUNTER — Other Ambulatory Visit (HOSPITAL_BASED_OUTPATIENT_CLINIC_OR_DEPARTMENT_OTHER): Payer: Federal, State, Local not specified - PPO

## 2015-09-02 VITALS — BP 135/94 | HR 97 | Temp 98.1°F | Resp 16 | Ht 62.0 in | Wt 224.0 lb

## 2015-09-02 DIAGNOSIS — D509 Iron deficiency anemia, unspecified: Secondary | ICD-10-CM | POA: Diagnosis not present

## 2015-09-02 DIAGNOSIS — K909 Intestinal malabsorption, unspecified: Secondary | ICD-10-CM

## 2015-09-02 LAB — COMPREHENSIVE METABOLIC PANEL
ALT: 40 U/L (ref 0–55)
AST: 27 U/L (ref 5–34)
Albumin: 4.2 g/dL (ref 3.5–5.0)
Alkaline Phosphatase: 131 U/L (ref 40–150)
Anion Gap: 14 mEq/L — ABNORMAL HIGH (ref 3–11)
BUN: 13.3 mg/dL (ref 7.0–26.0)
CO2: 27 mEq/L (ref 22–29)
Calcium: 10 mg/dL (ref 8.4–10.4)
Chloride: 100 mEq/L (ref 98–109)
Creatinine: 0.9 mg/dL (ref 0.6–1.1)
EGFR: 72 mL/min/{1.73_m2} — ABNORMAL LOW (ref >90)
Glucose: 106 mg/dl (ref 70–140)
Potassium: 4 mEq/L (ref 3.5–5.1)
Sodium: 141 mEq/L (ref 136–145)
Total Bilirubin: <0.3 mg/dL (ref 0.20–1.20)
Total Protein: 8.3 g/dL (ref 6.4–8.3)

## 2015-09-02 LAB — CBC WITH DIFFERENTIAL (CANCER CENTER ONLY)
BASO#: 0 10*3/uL (ref 0.0–0.2)
BASO%: 0.3 % (ref 0.0–2.0)
EOS%: 0 % (ref 0.0–7.0)
Eosinophils Absolute: 0 10*3/uL (ref 0.0–0.5)
HCT: 46.1 % (ref 34.8–46.6)
HGB: 15.9 g/dL (ref 11.6–15.9)
LYMPH#: 2 10*3/uL (ref 0.9–3.3)
LYMPH%: 26.5 % (ref 14.0–48.0)
MCH: 29.5 pg (ref 26.0–34.0)
MCHC: 34.5 g/dL (ref 32.0–36.0)
MCV: 86 fL (ref 81–101)
MONO#: 0.6 10*3/uL (ref 0.1–0.9)
MONO%: 8.4 % (ref 0.0–13.0)
NEUT#: 4.9 10*3/uL (ref 1.5–6.5)
NEUT%: 64.8 % (ref 39.6–80.0)
Platelets: 284 10*3/uL (ref 145–400)
RBC: 5.39 10*6/uL — ABNORMAL HIGH (ref 3.70–5.32)
RDW: 16.2 % — ABNORMAL HIGH (ref 11.1–15.7)
WBC: 7.5 10*3/uL (ref 3.9–10.0)

## 2015-09-02 LAB — IRON AND TIBC
%SAT: 41 % (ref 21–57)
Iron: 140 ug/dL (ref 41–142)
TIBC: 339 ug/dL (ref 236–444)
UIBC: 199 ug/dL (ref 120–384)

## 2015-09-02 LAB — FERRITIN: Ferritin: 20 ng/ml (ref 9–269)

## 2015-09-02 LAB — RETICULOCYTES: Reticulocyte Count: 1.9 % (ref 0.6–2.6)

## 2015-09-02 NOTE — Progress Notes (Signed)
Hematology and Oncology Follow Up Visit  Samantha Maynard [id number] [birth year] 55 y.o. 09/02/2015   Principle Diagnosis:  Iron deficiency anemia Recurrent pulmonary emboli  Current Therapy:   IV iron as indicated  Xarelto 20 mg by mouth daily - lifelong    Interim History:  Samantha Maynard is here today for a follow-up. She is doing well and has no complaints at this time. She received 2 doses of Feraheme in January. Her iron saturation was 9% at that time and ferritin of 7. Her Hgb at this time is stable at 15.9 with an MCV of 86. She is also taking a slow acting oral iron supplement.  No fatigue, fever, chills, ice cravings, n/v, cough, rash, dizziness, SOB, chest pain, palpitations, abdominal pain or changes in bowel or bladder habits.  No tenderness, numbness or tingling in her extremities. She has chronic swelling in her left ankle that comes and goes. No c/o joint aches or pains.  She is doing well on Xarelto and has had no episodes of bleeding, bruising or petechiae.  She has maintained a good appetite and is staying well hydrated. Her weight is stable.   Medications:    Medication List       This list is accurate as of: 09/02/15 11:07 AM.  Always use your most recent med list.               acetaminophen 325 MG tablet  Commonly known as:  TYLENOL  Take 325 mg by mouth every 6 (six) hours as needed for mild pain or moderate pain.     cetirizine 10 MG tablet  Commonly known as:  ZYRTEC  Take 10 mg by mouth daily as needed for allergies or rhinitis.     clonazePAM 1 MG tablet  Commonly known as:  KLONOPIN  Take 1 mg by mouth at bedtime as needed (sleep).     IRON 100/C PO  Take by mouth every morning.     lisdexamfetamine 70 MG capsule  Commonly known as:  VYVANSE  Take 70 mg by mouth daily.     metFORMIN 500 MG tablet  Commonly known as:  GLUCOPHAGE  Take 500 mg by mouth 2 (two) times daily with a meal.     multivitamin with minerals Tabs tablet  Take 1 tablet by  mouth daily.     senna 8.6 MG Tabs tablet  Commonly known as:  SENOKOT  Take 2 tablets by mouth at bedtime as needed for mild constipation.     venlafaxine XR 150 MG 24 hr capsule  Commonly known as:  EFFEXOR-XR  Take 150 mg by mouth daily at 6 PM.     venlafaxine XR 75 MG 24 hr capsule  Commonly known as:  EFFEXOR-XR  Take 75 mg by mouth daily at 6 PM.     XARELTO 20 MG Tabs tablet  Generic drug:  rivaroxaban  Take 20 mg by mouth daily with supper.        Allergies: No Known Allergies  Past Medical History, Surgical history, Social history, and Family History were reviewed and updated.  Review of Systems: All other 10 point review of systems is negative.   Physical Exam:  height is 5\' 2"  (1.575 m) and weight is 224 lb (101.606 kg). Her oral temperature is 98.1 F (36.7 C). Her blood pressure is 135/94 and her pulse is 97. Her respiration is 16.   Wt Readings from Last 3 Encounters:  09/02/15 224 lb (101.606 kg)  07/28/15 231  lb (104.781 kg)  06/03/15 231 lb (104.781 kg)    Ocular: Sclerae unicteric, pupils equal, round and reactive to light Ear-nose-throat: Oropharynx clear, dentition fair Lymphatic: No cervical supraclavicular or axillary adenopathy Lungs no rales or rhonchi, good excursion bilaterally Heart regular rate and rhythm, no murmur appreciated Abd soft, nontender, positive bowel sounds, no liver or spleen tip palpated on exam MSK no focal spinal tenderness, no joint edema Neuro: non-focal, well-oriented, appropriate affect Breasts: Deferred  Lab Results  Component Value Date   WBC 7.5 09/02/2015   HGB 15.9 09/02/2015   HCT 46.1 09/02/2015   MCV 86 09/02/2015   PLT 284 09/02/2015   Lab Results  Component Value Date   FERRITIN 7* 06/03/2015   IRON 33* 06/03/2015   TIBC 359 06/03/2015   UIBC 326 06/03/2015   IRONPCTSAT 9* 06/03/2015   Lab Results  Component Value Date   RETICCTPCT 1.2 03/25/2014   RBC 5.39* 09/02/2015   RETICCTABS 60.6  03/25/2014   No results found for: KPAFRELGTCHN, LAMBDASER, KAPLAMBRATIO No results found for: IGGSERUM, IGA, IGMSERUM No results found for: Odetta Pink, SPEI   Chemistry      Component Value Date/Time   NA 143 06/03/2015 1107   NA 140 09/03/2014 1500   K 4.2 06/03/2015 1107   K 4.4 09/03/2014 1500   CL 101 06/03/2015 1107   CL 103 09/03/2014 1500   CO2 29 06/03/2015 1107   CO2 29 09/03/2014 1500   BUN 17 06/03/2015 1107   BUN 16 09/03/2014 1500   CREATININE 0.9 06/03/2015 1107   CREATININE 0.86 09/03/2014 1500      Component Value Date/Time   CALCIUM 9.2 06/03/2015 1107   CALCIUM 9.2 09/03/2014 1500   ALKPHOS 115* 06/03/2015 1107   ALKPHOS 146* 09/03/2014 1500   AST 19 06/03/2015 1107   AST 52* 09/03/2014 1500   ALT 17 06/03/2015 1107   ALT 31 09/03/2014 1500   BILITOT 0.50 06/03/2015 1107   BILITOT 0.5 09/03/2014 1500     Impression and Plan: Samantha Maynard is 55 yo white female with iron deficiency anemia. She has responded nicely to the 2 doses of Feraheme she received in January and is asymptomatic at this time.  We will see what her iron studies today show and bring her in next week for an infusion if needed.  We will plan to see her back in 3 months for labs and follow-up.  She will contact us with any questions or concerns. We can certainly see her sooner if need be.   Eliezer Bottom, NP 4/14/201711:07 AM

## 2015-09-05 ENCOUNTER — Telehealth: Payer: Self-pay | Admitting: *Deleted

## 2015-09-05 NOTE — Telephone Encounter (Addendum)
LVM on personal voice mail  ----- Message from Eliezer Bottom, NP sent at 09/05/2015  8:58 AM EDT ----- Regarding: iron Iron levels look good! No infusion needed.   Sarah  ----- Message -----    From: [facility] Interface    Sent: 09/02/2015  10:56 AM      To: Eliezer Bottom, NP

## 2015-11-29 ENCOUNTER — Other Ambulatory Visit: Payer: Self-pay | Admitting: Obstetrics and Gynecology

## 2015-11-30 ENCOUNTER — Encounter (INDEPENDENT_AMBULATORY_CARE_PROVIDER_SITE_OTHER): Payer: Self-pay | Admitting: Ophthalmology

## 2015-12-02 ENCOUNTER — Ambulatory Visit (HOSPITAL_BASED_OUTPATIENT_CLINIC_OR_DEPARTMENT_OTHER): Payer: Federal, State, Local not specified - PPO | Admitting: Family

## 2015-12-02 ENCOUNTER — Other Ambulatory Visit (HOSPITAL_BASED_OUTPATIENT_CLINIC_OR_DEPARTMENT_OTHER): Payer: Federal, State, Local not specified - PPO

## 2015-12-02 ENCOUNTER — Encounter: Payer: Self-pay | Admitting: Family

## 2015-12-02 VITALS — BP 129/92 | HR 103 | Temp 98.3°F | Resp 18 | Ht 62.0 in | Wt 227.0 lb

## 2015-12-02 DIAGNOSIS — K909 Intestinal malabsorption, unspecified: Secondary | ICD-10-CM

## 2015-12-02 DIAGNOSIS — R5382 Chronic fatigue, unspecified: Secondary | ICD-10-CM | POA: Diagnosis not present

## 2015-12-02 DIAGNOSIS — D509 Iron deficiency anemia, unspecified: Secondary | ICD-10-CM | POA: Diagnosis not present

## 2015-12-02 LAB — IRON AND TIBC
%SAT: 13 % — ABNORMAL LOW (ref 21–57)
Iron: 43 ug/dL (ref 41–142)
TIBC: 323 ug/dL (ref 236–444)
UIBC: 280 ug/dL (ref 120–384)

## 2015-12-02 LAB — CBC WITH DIFFERENTIAL (CANCER CENTER ONLY)
BASO#: 0 10*3/uL (ref 0.0–0.2)
BASO%: 0.1 % (ref 0.0–2.0)
EOS%: 0 % (ref 0.0–7.0)
Eosinophils Absolute: 0 10*3/uL (ref 0.0–0.5)
HCT: 40.9 % (ref 34.8–46.6)
HGB: 13.8 g/dL (ref 11.6–15.9)
LYMPH#: 1.7 10*3/uL (ref 0.9–3.3)
LYMPH%: 21 % (ref 14.0–48.0)
MCH: 29.7 pg (ref 26.0–34.0)
MCHC: 33.7 g/dL (ref 32.0–36.0)
MCV: 88 fL (ref 81–101)
MONO#: 0.5 10*3/uL (ref 0.1–0.9)
MONO%: 6.2 % (ref 0.0–13.0)
NEUT#: 6 10*3/uL (ref 1.5–6.5)
NEUT%: 72.7 % (ref 39.6–80.0)
Platelets: 292 10*3/uL (ref 145–400)
RBC: 4.65 10*6/uL (ref 3.70–5.32)
RDW: 14.2 % (ref 11.1–15.7)
WBC: 8.2 10*3/uL (ref 3.9–10.0)

## 2015-12-02 LAB — COMPREHENSIVE METABOLIC PANEL
ALT: 23 U/L (ref 0–55)
AST: 19 U/L (ref 5–34)
Albumin: 3.7 g/dL (ref 3.5–5.0)
Alkaline Phosphatase: 135 U/L (ref 40–150)
Anion Gap: 11 mEq/L (ref 3–11)
BUN: 17.5 mg/dL (ref 7.0–26.0)
CO2: 25 mEq/L (ref 22–29)
Calcium: 9.3 mg/dL (ref 8.4–10.4)
Chloride: 104 mEq/L (ref 98–109)
Creatinine: 0.9 mg/dL (ref 0.6–1.1)
EGFR: 76 mL/min/{1.73_m2} — ABNORMAL LOW (ref >90)
Glucose: 92 mg/dl (ref 70–140)
Potassium: 4.1 mEq/L (ref 3.5–5.1)
Sodium: 139 mEq/L (ref 136–145)
Total Bilirubin: <0.3 mg/dL (ref 0.20–1.20)
Total Protein: 7.5 g/dL (ref 6.4–8.3)

## 2015-12-02 LAB — FERRITIN: Ferritin: 18 ng/ml (ref 9–269)

## 2015-12-02 NOTE — Progress Notes (Signed)
Hematology and Oncology Follow Up Visit  Samantha Maynard [id number] 1960-06-23 55 y.o. 12/02/2015   Principle Diagnosis:  Iron deficiency anemia Recurrent pulmonary emboli  Current Therapy:   IV iron as indicated  Xarelto 20 mg by mouth daily - lifelong    Interim History:  Samantha Maynard is here today for a follow-up. She has chronic fatigue but states that since starting the slow release PO iron supplement her energy has improved.  She is staying busy at work and is getting ready to go to [place]. Unfortunately she locked her keys in her car. Security here is helping her gain access.  No fever, chills, ice cravings, n/v, cough, rash, dizziness, SOB, chest pain, palpitations, abdominal pain or changes in bowel or bladder habits.  No tenderness, numbness or tingling in her extremities. She has chronic swelling in her left ankle that comes and goes. No c/o joint aches or pains.  She is doing well on Xarelto and has had no episodes of bleeding, bruising or petechiae.  She is eating well and staying hydrated in this summer heat. Her weight is stable.   Medications:    Medication List       This list is accurate as of: 12/02/15 11:32 AM.  Always use your most recent med list.               acetaminophen 325 MG tablet  Commonly known as:  TYLENOL  Take 325 mg by mouth every 6 (six) hours as needed for mild pain or moderate pain.     cetirizine 10 MG tablet  Commonly known as:  ZYRTEC  Take 10 mg by mouth daily as needed for allergies or rhinitis.     clonazePAM 1 MG tablet  Commonly known as:  KLONOPIN  Take 1 mg by mouth at bedtime as needed (sleep).     IRON 100/C PO  Take by mouth every morning.     lisdexamfetamine 70 MG capsule  Commonly known as:  VYVANSE  Take 70 mg by mouth daily.     metFORMIN 500 MG tablet  Commonly known as:  GLUCOPHAGE  Take 500 mg by mouth 2 (two) times daily with a meal.     multivitamin with minerals Tabs tablet  Take 1 tablet by mouth  daily.     senna 8.6 MG Tabs tablet  Commonly known as:  SENOKOT  Take 2 tablets by mouth at bedtime as needed for mild constipation.     triamcinolone ointment 0.1 %  Commonly known as:  KENALOG     venlafaxine XR 150 MG 24 hr capsule  Commonly known as:  EFFEXOR-XR  Take 150 mg by mouth daily at 6 PM.     venlafaxine XR 75 MG 24 hr capsule  Commonly known as:  EFFEXOR-XR  Take 75 mg by mouth daily at 6 PM.     XARELTO 20 MG Tabs tablet  Generic drug:  rivaroxaban  Take 20 mg by mouth daily with supper.        Allergies: No Known Allergies  Past Medical History, Surgical history, Social history, and Family History were reviewed and updated.  Review of Systems: All other 10 point review of systems is negative.   Physical Exam:  height is 5\' 2"  (1.575 m) and weight is 227 lb (102.967 kg). Her oral temperature is 98.3 F (36.8 C). Her blood pressure is 129/92 and her pulse is 103. Her respiration is 18.   Wt Readings from Last 3 Encounters:  12/02/15  227 lb (102.967 kg)  09/02/15 224 lb (101.606 kg)  07/28/15 231 lb (104.781 kg)    Ocular: Sclerae unicteric, pupils equal, round and reactive to light Ear-nose-throat: Oropharynx clear, dentition fair Lymphatic: No cervical supraclavicular or axillary adenopathy Lungs no rales or rhonchi, good excursion bilaterally Heart regular rate and rhythm, no murmur appreciated Abd soft, nontender, positive bowel sounds, no liver or spleen tip palpated on exam MSK no focal spinal tenderness, no joint edema Neuro: non-focal, well-oriented, appropriate affect Breasts: Deferred  Lab Results  Component Value Date   WBC 8.2 12/02/2015   HGB 13.8 12/02/2015   HCT 40.9 12/02/2015   MCV 88 12/02/2015   PLT 292 12/02/2015   Lab Results  Component Value Date   FERRITIN 20 09/02/2015   IRON 140 09/02/2015   TIBC 339 09/02/2015   UIBC 199 09/02/2015   IRONPCTSAT 41 09/02/2015   Lab Results  Component Value Date   RETICCTPCT  1.2 03/25/2014   RBC 4.65 12/02/2015   RETICCTABS 60.6 03/25/2014   No results found for: KPAFRELGTCHN, LAMBDASER, KAPLAMBRATIO No results found for: IGGSERUM, IGA, IGMSERUM No results found for: Kathrynn Ducking, MSPIKE, SPEI   Chemistry      Component Value Date/Time   NA 141 09/02/2015 1046   NA 143 06/03/2015 1107   NA 140 09/03/2014 1500   K 4.0 09/02/2015 1046   K 4.2 06/03/2015 1107   K 4.4 09/03/2014 1500   CL 101 06/03/2015 1107   CL 103 09/03/2014 1500   CO2 27 09/02/2015 1046   CO2 29 06/03/2015 1107   CO2 29 09/03/2014 1500   BUN 13.3 09/02/2015 1046   BUN 17 06/03/2015 1107   BUN 16 09/03/2014 1500   CREATININE 0.9 09/02/2015 1046   CREATININE 0.9 06/03/2015 1107   CREATININE 0.86 09/03/2014 1500      Component Value Date/Time   CALCIUM 10.0 09/02/2015 1046   CALCIUM 9.2 06/03/2015 1107   CALCIUM 9.2 09/03/2014 1500   ALKPHOS 131 09/02/2015 1046   ALKPHOS 115* 06/03/2015 1107   ALKPHOS 146* 09/03/2014 1500   AST 27 09/02/2015 1046   AST 19 06/03/2015 1107   AST 52* 09/03/2014 1500   ALT 40 09/02/2015 1046   ALT 17 06/03/2015 1107   ALT 31 09/03/2014 1500   BILITOT <0.30 09/02/2015 1046   BILITOT 0.50 06/03/2015 1107   BILITOT 0.5 09/03/2014 1500     Impression and Plan: Samantha Maynard is 55 yo white female with iron deficiency anemia. She is doing well and feels better taking the slow release PO iron. Her last infusion was in January. Her Hgb is stable at 13.8 with an MCV of 88.  We will see what her iron studies today show and bring her in next week for an infusion if needed.  We will plan to see her back in 3 months for labs and follow-up.  She will contact us with any questions or concerns. We can certainly see her sooner if need be.   Eliezer Bottom, NP 7/14/201711:32 AM

## 2015-12-03 LAB — RETICULOCYTES: Reticulocyte Count: 2.2 % (ref 0.6–2.6)

## 2015-12-09 ENCOUNTER — Other Ambulatory Visit: Payer: Self-pay

## 2015-12-09 DIAGNOSIS — D509 Iron deficiency anemia, unspecified: Secondary | ICD-10-CM

## 2015-12-16 ENCOUNTER — Ambulatory Visit (HOSPITAL_BASED_OUTPATIENT_CLINIC_OR_DEPARTMENT_OTHER): Payer: Federal, State, Local not specified - PPO

## 2015-12-16 DIAGNOSIS — D509 Iron deficiency anemia, unspecified: Secondary | ICD-10-CM | POA: Diagnosis not present

## 2015-12-16 MED ORDER — SODIUM CHLORIDE 0.9 % IV SOLN
510.0000 mg | Freq: Once | INTRAVENOUS | Status: AC
  Administered 2015-12-16: 510 mg via INTRAVENOUS
  Filled 2015-12-16: qty 17

## 2015-12-16 MED ORDER — SODIUM CHLORIDE 0.9 % IV SOLN
INTRAVENOUS | Status: DC
  Administered 2015-12-16: 14:00:00 via INTRAVENOUS

## 2015-12-16 NOTE — Patient Instructions (Signed)

## 2016-01-05 ENCOUNTER — Encounter (INDEPENDENT_AMBULATORY_CARE_PROVIDER_SITE_OTHER): Payer: Federal, State, Local not specified - PPO | Admitting: Ophthalmology

## 2016-01-05 DIAGNOSIS — E119 Type 2 diabetes mellitus without complications: Secondary | ICD-10-CM

## 2016-01-05 DIAGNOSIS — H43813 Vitreous degeneration, bilateral: Secondary | ICD-10-CM

## 2016-02-06 DIAGNOSIS — K080 Exfoliation of teeth due to systemic causes: Secondary | ICD-10-CM | POA: Diagnosis not present

## 2016-02-15 DIAGNOSIS — K080 Exfoliation of teeth due to systemic causes: Secondary | ICD-10-CM | POA: Diagnosis not present

## 2016-03-02 ENCOUNTER — Other Ambulatory Visit: Payer: Federal, State, Local not specified - PPO

## 2016-03-02 ENCOUNTER — Ambulatory Visit: Payer: Federal, State, Local not specified - PPO | Admitting: Hematology & Oncology

## 2016-03-05 ENCOUNTER — Ambulatory Visit (HOSPITAL_BASED_OUTPATIENT_CLINIC_OR_DEPARTMENT_OTHER): Payer: Federal, State, Local not specified - PPO | Admitting: Hematology & Oncology

## 2016-03-05 ENCOUNTER — Other Ambulatory Visit (HOSPITAL_BASED_OUTPATIENT_CLINIC_OR_DEPARTMENT_OTHER): Payer: Federal, State, Local not specified - PPO

## 2016-03-05 ENCOUNTER — Encounter: Payer: Self-pay | Admitting: Hematology & Oncology

## 2016-03-05 VITALS — BP 154/116 | HR 107 | Temp 97.5°F | Resp 16 | Ht 62.0 in | Wt 229.8 lb

## 2016-03-05 DIAGNOSIS — K909 Intestinal malabsorption, unspecified: Secondary | ICD-10-CM | POA: Diagnosis not present

## 2016-03-05 DIAGNOSIS — Z86711 Personal history of pulmonary embolism: Secondary | ICD-10-CM | POA: Diagnosis not present

## 2016-03-05 DIAGNOSIS — D509 Iron deficiency anemia, unspecified: Secondary | ICD-10-CM | POA: Diagnosis not present

## 2016-03-05 DIAGNOSIS — Z7901 Long term (current) use of anticoagulants: Secondary | ICD-10-CM

## 2016-03-05 DIAGNOSIS — I2782 Chronic pulmonary embolism: Secondary | ICD-10-CM

## 2016-03-05 LAB — CBC WITH DIFFERENTIAL (CANCER CENTER ONLY)
BASO#: 0 10*3/uL (ref 0.0–0.2)
BASO%: 0 % (ref 0.0–2.0)
EOS%: 0 % (ref 0.0–7.0)
Eosinophils Absolute: 0 10*3/uL (ref 0.0–0.5)
HCT: 42.2 % (ref 34.8–46.6)
HGB: 14.1 g/dL (ref 11.6–15.9)
LYMPH#: 1.7 10*3/uL (ref 0.9–3.3)
LYMPH%: 24 % (ref 14.0–48.0)
MCH: 29.6 pg (ref 26.0–34.0)
MCHC: 33.4 g/dL (ref 32.0–36.0)
MCV: 89 fL (ref 81–101)
MONO#: 0.6 10*3/uL (ref 0.1–0.9)
MONO%: 8.3 % (ref 0.0–13.0)
NEUT#: 4.8 10*3/uL (ref 1.5–6.5)
NEUT%: 67.7 % (ref 39.6–80.0)
Platelets: 319 10*3/uL (ref 145–400)
RBC: 4.76 10*6/uL (ref 3.70–5.32)
RDW: 14.1 % (ref 11.1–15.7)
WBC: 7.1 10*3/uL (ref 3.9–10.0)

## 2016-03-05 LAB — CMP (CANCER CENTER ONLY)
ALT(SGPT): 30 U/L (ref 10–47)
AST: 22 U/L (ref 11–38)
Albumin: 3.6 g/dL (ref 3.3–5.5)
Alkaline Phosphatase: 138 U/L — ABNORMAL HIGH (ref 26–84)
BUN, Bld: 14 mg/dL (ref 7–22)
CO2: 30 meq/L (ref 18–33)
Calcium: 9.8 mg/dL (ref 8.0–10.3)
Chloride: 99 meq/L (ref 98–108)
Creat: 0.8 mg/dL (ref 0.6–1.2)
Glucose, Bld: 122 mg/dL — ABNORMAL HIGH (ref 73–118)
Potassium: 4.8 meq/L — ABNORMAL HIGH (ref 3.3–4.7)
Sodium: 138 meq/L (ref 128–145)
Total Bilirubin: 0.4 mg/dL (ref 0.20–1.60)
Total Protein: 7.3 g/dL (ref 6.4–8.1)

## 2016-03-05 NOTE — Progress Notes (Signed)
Hematology and Oncology Follow Up Visit  Samantha Maynard [id number] 1960-10-22 55 y.o. 03/05/2016   Principle Diagnosis:  Iron deficiency anemia Recurrent pulmonary emboli  Current Therapy:   IV iron as indicated  Xarelto 20 mg by mouth daily - lifelong    Interim History:  Samantha Maynard is here today for a follow-up.   She's had no problems with the Xarelto. There's been no bleeding. She's had no nausea or vomiting. She's had no cough or shortness of breath. She's had no change in bowel or bladder habits.  Her last mammogram was back in January.  Her last iron studies back in July showed a ferritin of 18 with iron saturation of 13%. I think she did get a dose of IV iron back in July. This made her feel better.   She's had no leg swelling. She's had no rashes.  Overall, her performance status is ECOG 0.  Medications:    Medication List       Accurate as of 03/05/16  3:32 PM. Always use your most recent med list.          acetaminophen 325 MG tablet Commonly known as:  TYLENOL Take 325 mg by mouth every 6 (six) hours as needed for mild pain or moderate pain.   cetirizine 10 MG tablet Commonly known as:  ZYRTEC Take 10 mg by mouth daily as needed for allergies or rhinitis.   clonazePAM 1 MG tablet Commonly known as:  KLONOPIN Take 1 mg by mouth at bedtime as needed (sleep).   IRON 100/C PO Take by mouth every morning.   lisdexamfetamine 70 MG capsule Commonly known as:  VYVANSE Take 70 mg by mouth daily.   metFORMIN 500 MG tablet Commonly known as:  GLUCOPHAGE Take 500 mg by mouth 2 (two) times daily with a meal.   multivitamin with minerals Tabs tablet Take 1 tablet by mouth daily.   senna 8.6 MG Tabs tablet Commonly known as:  SENOKOT Take 2 tablets by mouth at bedtime as needed for mild constipation.   triamcinolone ointment 0.1 % Commonly known as:  KENALOG   venlafaxine XR 150 MG 24 hr capsule Commonly known as:  EFFEXOR-XR Take 150 mg by mouth  daily at 6 PM.   venlafaxine XR 75 MG 24 hr capsule Commonly known as:  EFFEXOR-XR Take 75 mg by mouth daily at 6 PM.   XARELTO 20 MG Tabs tablet Generic drug:  rivaroxaban Take 20 mg by mouth daily with supper.       Allergies: No Known Allergies  Past Medical History, Surgical history, Social history, and Family History were reviewed and updated.  Review of Systems: All other 10 point review of systems is negative.   Physical Exam:  height is 5\' 2"  (1.575 m) and weight is 229 lb 12.8 oz (104.2 kg). Her oral temperature is 97.5 F (36.4 C). Her blood pressure is 154/116 (abnormal) and her pulse is 107 (abnormal). Her respiration is 16.   Wt Readings from Last 3 Encounters:  03/05/16 229 lb 12.8 oz (104.2 kg)  12/02/15 227 lb (103 kg)  09/02/15 224 lb (101.6 kg)    Well-developed and well-nourished white female. She is mildly obese. Head and neck exam shows no ocular or oral lesions. She has no palpable cervical or supraclavicular lymph nodes. Lungs are clear. Cardiac exam regular rate and rhythm with no murmurs, rubs or bruits. Abdomen is soft. She has good bowel sounds. There is no fluid wave. There is no palpable liver  or spleen tip. Back exam shows no tenderness over the spine, ribs or hips. Extremities shows no clubbing, cyanosis or edema. Neurological exam shows no focal neurological deficits. Skin exam shows no rashes, ecchymoses or petechia.   Lab Results  Component Value Date   WBC 7.1 03/05/2016   HGB 14.1 03/05/2016   HCT 42.2 03/05/2016   MCV 89 03/05/2016   PLT 319 03/05/2016   Lab Results  Component Value Date   FERRITIN 18 12/02/2015   IRON 43 12/02/2015   TIBC 323 12/02/2015   UIBC 280 12/02/2015   IRONPCTSAT 13 (L) 12/02/2015   Lab Results  Component Value Date   RETICCTPCT 1.2 03/25/2014   RBC 4.76 03/05/2016   RETICCTABS 60.6 03/25/2014   No results found for: KPAFRELGTCHN, LAMBDASER, KAPLAMBRATIO No results found for: IGGSERUM, IGA,  IGMSERUM No results found for: Odetta Pink, SPEI   Chemistry      Component Value Date/Time   NA 138 03/05/2016 1416   NA 139 12/02/2015 1104   K 4.8 (H) 03/05/2016 1416   K 4.1 12/02/2015 1104   CL 99 03/05/2016 1416   CO2 30 03/05/2016 1416   CO2 25 12/02/2015 1104   BUN 14 03/05/2016 1416   BUN 17.5 12/02/2015 1104   CREATININE 0.8 03/05/2016 1416   CREATININE 0.9 12/02/2015 1104      Component Value Date/Time   CALCIUM 9.8 03/05/2016 1416   CALCIUM 9.3 12/02/2015 1104   ALKPHOS 138 (H) 03/05/2016 1416   ALKPHOS 135 12/02/2015 1104   AST 22 03/05/2016 1416   AST 19 12/02/2015 1104   ALT 30 03/05/2016 1416   ALT 23 12/02/2015 1104   BILITOT 0.40 03/05/2016 1416   BILITOT <0.30 12/02/2015 1104     Impression and Plan: Samantha Maynard is 55 yo white female with iron deficiency anemia.  For right now, everything is looking pretty good. I do not see any clinical evidence of the pulmonary embolism. Again she is on lifelong anticoagulation.  Of note, her hypercoagulable studies have all been normal.  I think we can probably get her back in 6 months now. I think this would be quite reasonable.   Volanda Napoleon, MD 10/16/20173:32 PM

## 2016-03-06 LAB — FERRITIN: Ferritin: 14 ng/ml (ref 9–269)

## 2016-03-06 LAB — RETICULOCYTES: Reticulocyte Count: 2.8 % — ABNORMAL HIGH (ref 0.6–2.6)

## 2016-03-06 LAB — IRON AND TIBC
%SAT: 11 % — ABNORMAL LOW (ref 21–57)
Iron: 39 ug/dL — ABNORMAL LOW (ref 41–142)
TIBC: 349 ug/dL (ref 236–444)
UIBC: 310 ug/dL (ref 120–384)

## 2016-03-08 ENCOUNTER — Other Ambulatory Visit: Payer: Self-pay

## 2016-03-08 DIAGNOSIS — F322 Major depressive disorder, single episode, severe without psychotic features: Secondary | ICD-10-CM | POA: Diagnosis not present

## 2016-03-12 DIAGNOSIS — K080 Exfoliation of teeth due to systemic causes: Secondary | ICD-10-CM | POA: Diagnosis not present

## 2016-03-23 DIAGNOSIS — K080 Exfoliation of teeth due to systemic causes: Secondary | ICD-10-CM | POA: Diagnosis not present

## 2016-05-31 ENCOUNTER — Other Ambulatory Visit: Payer: Self-pay | Admitting: Obstetrics and Gynecology

## 2016-05-31 DIAGNOSIS — Z1231 Encounter for screening mammogram for malignant neoplasm of breast: Secondary | ICD-10-CM

## 2016-06-12 ENCOUNTER — Telehealth: Payer: Self-pay | Admitting: *Deleted

## 2016-06-12 ENCOUNTER — Other Ambulatory Visit: Payer: Self-pay | Admitting: *Deleted

## 2016-06-12 DIAGNOSIS — D509 Iron deficiency anemia, unspecified: Secondary | ICD-10-CM

## 2016-06-12 NOTE — Telephone Encounter (Signed)
PAtient called stating she just saw her email from nurse back in October 2017 statin that she needs iron infusion.  Worried that labs are too far out and medicine wouldn't be paid for, patient coming in for repeat labs on Wed and possible iron infusion on Monday 06/18/2016

## 2016-06-13 ENCOUNTER — Other Ambulatory Visit (HOSPITAL_BASED_OUTPATIENT_CLINIC_OR_DEPARTMENT_OTHER): Payer: Federal, State, Local not specified - PPO

## 2016-06-13 DIAGNOSIS — F322 Major depressive disorder, single episode, severe without psychotic features: Secondary | ICD-10-CM | POA: Diagnosis not present

## 2016-06-13 DIAGNOSIS — D509 Iron deficiency anemia, unspecified: Secondary | ICD-10-CM

## 2016-06-13 LAB — CBC WITH DIFFERENTIAL (CANCER CENTER ONLY)
BASO#: 0 10*3/uL (ref 0.0–0.2)
BASO%: 0.2 % (ref 0.0–2.0)
EOS%: 0 % (ref 0.0–7.0)
Eosinophils Absolute: 0 10*3/uL (ref 0.0–0.5)
HCT: 30.1 % — ABNORMAL LOW (ref 34.8–46.6)
HGB: 9 g/dL — ABNORMAL LOW (ref 11.6–15.9)
LYMPH#: 1.5 10*3/uL (ref 0.9–3.3)
LYMPH%: 27.5 % (ref 14.0–48.0)
MCH: 23.6 pg — ABNORMAL LOW (ref 26.0–34.0)
MCHC: 29.9 g/dL — ABNORMAL LOW (ref 32.0–36.0)
MCV: 79 fL — ABNORMAL LOW (ref 81–101)
MONO#: 0.5 10*3/uL (ref 0.1–0.9)
MONO%: 8.4 % (ref 0.0–13.0)
NEUT#: 3.6 10*3/uL (ref 1.5–6.5)
NEUT%: 63.9 % (ref 39.6–80.0)
Platelets: 413 10*3/uL — ABNORMAL HIGH (ref 145–400)
RBC: 3.82 10*6/uL (ref 3.70–5.32)
RDW: 17.1 % — ABNORMAL HIGH (ref 11.1–15.7)
WBC: 5.6 10*3/uL (ref 3.9–10.0)

## 2016-06-13 LAB — CMP (CANCER CENTER ONLY)
ALT(SGPT): 19 U/L (ref 10–47)
AST: 17 U/L (ref 11–38)
Albumin: 3.4 g/dL (ref 3.3–5.5)
Alkaline Phosphatase: 123 U/L — ABNORMAL HIGH (ref 26–84)
BUN, Bld: 11 mg/dL (ref 7–22)
CO2: 27 mEq/L (ref 18–33)
Calcium: 8.8 mg/dL (ref 8.0–10.3)
Chloride: 101 mEq/L (ref 98–108)
Creat: 0.8 mg/dl (ref 0.6–1.2)
Glucose, Bld: 141 mg/dL — ABNORMAL HIGH (ref 73–118)
Potassium: 4 mEq/L (ref 3.3–4.7)
Sodium: 139 mEq/L (ref 128–145)
Total Bilirubin: 0.5 mg/dl (ref 0.20–1.60)
Total Protein: 7 g/dL (ref 6.4–8.1)

## 2016-06-14 LAB — IRON AND TIBC
%SAT: 5 % — ABNORMAL LOW (ref 21–57)
Iron: 17 ug/dL — ABNORMAL LOW (ref 41–142)
TIBC: 376 ug/dL (ref 236–444)
UIBC: 359 ug/dL (ref 120–384)

## 2016-06-14 LAB — FERRITIN: Ferritin: 4 ng/ml — ABNORMAL LOW (ref 9–269)

## 2016-06-15 ENCOUNTER — Telehealth: Payer: Self-pay | Admitting: *Deleted

## 2016-06-15 NOTE — Telephone Encounter (Addendum)
Patient aware of results. Appointment made  ----- Message from Eliezer Bottom, NP sent at 06/14/2016  1:26 PM EST ----- Regarding: iron  Iron quite low. Patient will need two doses of IV iron please. Thank you!  Sarah  ----- Message ----- From: Volanda Napoleon, MD Sent: 06/14/2016   1:01 PM To: Eliezer Bottom, NP    ----- Message ----- From: Interface, [facility] Sent: 06/13/2016   2:29 PM To: Volanda Napoleon, MD

## 2016-06-18 ENCOUNTER — Ambulatory Visit (HOSPITAL_BASED_OUTPATIENT_CLINIC_OR_DEPARTMENT_OTHER): Payer: Federal, State, Local not specified - PPO

## 2016-06-18 ENCOUNTER — Other Ambulatory Visit: Payer: Self-pay | Admitting: Family

## 2016-06-18 VITALS — BP 133/86 | HR 93 | Temp 97.7°F | Resp 16

## 2016-06-18 DIAGNOSIS — K909 Intestinal malabsorption, unspecified: Secondary | ICD-10-CM

## 2016-06-18 DIAGNOSIS — D509 Iron deficiency anemia, unspecified: Secondary | ICD-10-CM | POA: Diagnosis not present

## 2016-06-18 MED ORDER — SODIUM CHLORIDE 0.9 % IV SOLN
Freq: Once | INTRAVENOUS | Status: AC
  Administered 2016-06-18: 15:00:00 via INTRAVENOUS

## 2016-06-18 MED ORDER — SODIUM CHLORIDE 0.9 % IV SOLN
510.0000 mg | Freq: Once | INTRAVENOUS | Status: AC
  Administered 2016-06-18: 510 mg via INTRAVENOUS
  Filled 2016-06-18: qty 17

## 2016-06-27 DIAGNOSIS — K080 Exfoliation of teeth due to systemic causes: Secondary | ICD-10-CM | POA: Diagnosis not present

## 2016-06-28 ENCOUNTER — Ambulatory Visit
Admission: RE | Admit: 2016-06-28 | Discharge: 2016-06-28 | Disposition: A | Discharge status: Home / Self Care | Payer: Federal, State, Local not specified - PPO | Source: Ambulatory Visit | Attending: Obstetrics and Gynecology | Admitting: Obstetrics and Gynecology

## 2016-06-28 ENCOUNTER — Ambulatory Visit (HOSPITAL_BASED_OUTPATIENT_CLINIC_OR_DEPARTMENT_OTHER): Payer: Federal, State, Local not specified - PPO

## 2016-06-28 ENCOUNTER — Other Ambulatory Visit: Payer: Self-pay | Admitting: Internal Medicine

## 2016-06-28 VITALS — BP 136/84 | HR 91 | Temp 98.1°F | Resp 18

## 2016-06-28 DIAGNOSIS — I2699 Other pulmonary embolism without acute cor pulmonale: Secondary | ICD-10-CM | POA: Diagnosis not present

## 2016-06-28 DIAGNOSIS — E119 Type 2 diabetes mellitus without complications: Secondary | ICD-10-CM | POA: Diagnosis not present

## 2016-06-28 DIAGNOSIS — Z1231 Encounter for screening mammogram for malignant neoplasm of breast: Secondary | ICD-10-CM

## 2016-06-28 DIAGNOSIS — K449 Diaphragmatic hernia without obstruction or gangrene: Secondary | ICD-10-CM

## 2016-06-28 DIAGNOSIS — D509 Iron deficiency anemia, unspecified: Secondary | ICD-10-CM

## 2016-06-28 DIAGNOSIS — K909 Intestinal malabsorption, unspecified: Secondary | ICD-10-CM

## 2016-06-28 DIAGNOSIS — F322 Major depressive disorder, single episode, severe without psychotic features: Secondary | ICD-10-CM | POA: Diagnosis not present

## 2016-06-28 DIAGNOSIS — K219 Gastro-esophageal reflux disease without esophagitis: Secondary | ICD-10-CM

## 2016-06-28 MED ORDER — FERUMOXYTOL INJECTION 510 MG/17 ML
510.0000 mg | Freq: Once | INTRAVENOUS | Status: AC
  Administered 2016-06-28: 510 mg via INTRAVENOUS
  Filled 2016-06-28: qty 17

## 2016-06-28 MED ORDER — SODIUM CHLORIDE 0.9 % IV SOLN
Freq: Once | INTRAVENOUS | Status: AC
  Administered 2016-06-28: 12:00:00 via INTRAVENOUS

## 2016-06-28 NOTE — Patient Instructions (Signed)

## 2016-06-29 ENCOUNTER — Other Ambulatory Visit: Payer: Self-pay | Admitting: Internal Medicine

## 2016-06-29 DIAGNOSIS — R0609 Other forms of dyspnea: Secondary | ICD-10-CM

## 2016-06-29 DIAGNOSIS — R0600 Dyspnea, unspecified: Secondary | ICD-10-CM

## 2016-07-02 ENCOUNTER — Telehealth (HOSPITAL_COMMUNITY): Payer: Self-pay | Admitting: *Deleted

## 2016-07-02 NOTE — Telephone Encounter (Signed)
Patient given detailed instructions per Myocardial Perfusion Study Information Sheet for the test on 07/03/16 at 0730. Patient notified to arrive 15 minutes early and that it is imperative to arrive on time for appointment to keep from having the test rescheduled.  If you need to cancel or reschedule your appointment, please call the office within 24 hours of your appointment. Failure to do so may result in a cancellation of your appointment, and a $50 no show fee. Patient verbalized understanding.Imre Vecchione, [place]

## 2016-07-03 ENCOUNTER — Ambulatory Visit (HOSPITAL_COMMUNITY): Payer: Federal, State, Local not specified - PPO | Attending: Cardiology

## 2016-07-03 DIAGNOSIS — R9439 Abnormal result of other cardiovascular function study: Secondary | ICD-10-CM | POA: Diagnosis not present

## 2016-07-03 DIAGNOSIS — E119 Type 2 diabetes mellitus without complications: Secondary | ICD-10-CM | POA: Diagnosis not present

## 2016-07-03 DIAGNOSIS — R0609 Other forms of dyspnea: Secondary | ICD-10-CM

## 2016-07-03 DIAGNOSIS — R0600 Dyspnea, unspecified: Secondary | ICD-10-CM

## 2016-07-03 DIAGNOSIS — R0789 Other chest pain: Secondary | ICD-10-CM | POA: Diagnosis not present

## 2016-07-03 MED ORDER — TECHNETIUM TC 99M TETROFOSMIN IV KIT
32.8000 | PACK | Freq: Once | INTRAVENOUS | Status: AC | PRN
  Administered 2016-07-03: 32.8 via INTRAVENOUS
  Filled 2016-07-03: qty 33

## 2016-07-04 ENCOUNTER — Ambulatory Visit (HOSPITAL_COMMUNITY): Payer: Federal, State, Local not specified - PPO | Attending: Cardiology

## 2016-07-04 ENCOUNTER — Ambulatory Visit
Admission: RE | Admit: 2016-07-04 | Discharge: 2016-07-04 | Disposition: A | Discharge status: Home / Self Care | Payer: Federal, State, Local not specified - PPO | Source: Ambulatory Visit | Attending: Internal Medicine | Admitting: Internal Medicine

## 2016-07-04 DIAGNOSIS — K449 Diaphragmatic hernia without obstruction or gangrene: Secondary | ICD-10-CM

## 2016-07-04 DIAGNOSIS — K219 Gastro-esophageal reflux disease without esophagitis: Secondary | ICD-10-CM | POA: Diagnosis not present

## 2016-07-04 LAB — MYOCARDIAL PERFUSION IMAGING
Estimated workload: 7.1 METS
Exercise duration (min): 5 min
Exercise duration (sec): 29 s
LV dias vol: 60 mL (ref 46–106)
LV sys vol: 20 mL
MPHR: 166 {beats}/min
Peak HR: 151 {beats}/min
Percent HR: 90 %
RATE: 0.3
RPE: 17
Rest HR: 100 {beats}/min
SDS: 8
SRS: 6
SSS: 14
TID: 1.01

## 2016-07-04 MED ORDER — TECHNETIUM TC 99M TETROFOSMIN IV KIT
32.9000 | PACK | Freq: Once | INTRAVENOUS | Status: AC | PRN
  Administered 2016-07-04: 32.9 via INTRAVENOUS
  Filled 2016-07-04: qty 33

## 2016-07-25 DIAGNOSIS — K449 Diaphragmatic hernia without obstruction or gangrene: Secondary | ICD-10-CM | POA: Diagnosis not present

## 2016-07-25 DIAGNOSIS — Z86711 Personal history of pulmonary embolism: Secondary | ICD-10-CM | POA: Diagnosis not present

## 2016-07-25 DIAGNOSIS — Z7901 Long term (current) use of anticoagulants: Secondary | ICD-10-CM | POA: Diagnosis not present

## 2016-07-27 ENCOUNTER — Encounter: Payer: Self-pay | Admitting: Gastroenterology

## 2016-08-01 ENCOUNTER — Telehealth: Payer: Self-pay | Admitting: Emergency Medicine

## 2016-08-01 ENCOUNTER — Other Ambulatory Visit: Payer: Self-pay | Admitting: Gastroenterology

## 2016-08-01 ENCOUNTER — Ambulatory Visit (INDEPENDENT_AMBULATORY_CARE_PROVIDER_SITE_OTHER): Payer: Federal, State, Local not specified - PPO | Admitting: Gastroenterology

## 2016-08-01 ENCOUNTER — Encounter: Payer: Self-pay | Admitting: Gastroenterology

## 2016-08-01 VITALS — BP 124/86 | Ht 62.0 in | Wt 238.0 lb

## 2016-08-01 DIAGNOSIS — K449 Diaphragmatic hernia without obstruction or gangrene: Secondary | ICD-10-CM

## 2016-08-01 DIAGNOSIS — D509 Iron deficiency anemia, unspecified: Secondary | ICD-10-CM | POA: Diagnosis not present

## 2016-08-01 DIAGNOSIS — Z7901 Long term (current) use of anticoagulants: Secondary | ICD-10-CM | POA: Insufficient documentation

## 2016-08-01 NOTE — Telephone Encounter (Signed)
08/01/2016   RE: Samantha Maynard DOB: Apr 18, 1961 MRN: [id number]   Dear Dr. Lysle Rubens,    We have scheduled the above patient for an endoscopic procedure. Our records show that she is on anticoagulation therapy.   Please advise as to how long the patient may come off her therapy of Xarelto prior to the procedure, which is scheduled for 10/11/16.  Please fax back/ or route the completed form to Tinnie Gens, [place].   Sincerely,   Tinnie Gens, [place]

## 2016-08-01 NOTE — Progress Notes (Signed)
Physician assistant consultation note reviewed. Plan is noted. Jessica: (1) please check with her hematologist, Dr. Marin Olp regarding Coumadin management, given her history. We could perform diagnostic EGD on Coumadin quite easily if she is high risk to come off. (2) please obtain colonoscopy report from [place] so that it may be reviewed to make sure that it was adequate and there was no associated relevant pathology. Thank you. Dr. Henrene Pastor

## 2016-08-01 NOTE — Patient Instructions (Signed)
You have been scheduled for an esophageal manometry at [facility] on 08-15-16 at 8:30 am. Please arrive 30 minutes prior to your procedure for registration. You will need to go to outpatient registration (1st floor of the hospital) first. Make certain to bring your insurance cards as well as a complete list of medications.  Please remember the following:  1) Do not take any muscle relaxants, xanax (alprazolam) or ativan for 1 day prior to your     test as well as the day of the test.  2) Nothing to eat or drink after 12:00 midnight on the night before your test.  3) Hold all diabetic medications/insulin the morning of the test. You may eat and take             your medications after the test.  It will take at least 2 weeks to receive the results of this test from your physician. ------------------------------------------ ABOUT ESOPHAGEAL MANOMETRY Esophageal manometry (muh-NOM-uh-tree) is a test that gauges how well your esophagus works. Your esophagus is the long, muscular tube that connects your throat to your stomach. Esophageal manometry measures the rhythmic muscle contractions (peristalsis) that occur in your esophagus when you swallow. Esophageal manometry also measures the coordination and force exerted by the muscles of your esophagus.  During esophageal manometry, a thin, flexible tube (catheter) that contains sensors is passed through your nose, down your esophagus and into your stomach. Esophageal manometry can be helpful in diagnosing some mostly uncommon disorders that affect your esophagus.  Why it's done Esophageal manometry is used to evaluate the movement (motility) of food through the esophagus and into the stomach. The test measures how well the circular bands of muscle (sphincters) at the top and bottom of your esophagus open and close, as well as the pressure, strength and pattern of the wave of esophageal muscle contractions that moves food along.  What you can  expect Esophageal manometry is an outpatient procedure done without sedation. Most people tolerate it well. You may be asked to change into a hospital gown before the test starts.  During esophageal manometry  While you are sitting up, a member of your health care team sprays your throat with a numbing medication or puts numbing gel in your nose or both.  A catheter is guided through your nose into your esophagus. The catheter may be sheathed in a water-filled sleeve. It doesn't interfere with your breathing. However, your eyes may water, and you may gag. You may have a slight nosebleed from irritation.  After the catheter is in place, you may be asked to lie on your back on an exam table, or you may be asked to remain seated.  You then swallow small sips of water. As you do, a computer connected to the catheter records the pressure, strength and pattern of your esophageal muscle contractions.  During the test, you'll be asked to breathe slowly and smoothly, remain as still as possible, and swallow only when you're asked to do so.  A member of your health care team may move the catheter down into your stomach while the catheter continues its measurements.  The catheter then is slowly withdrawn. The test usually lasts 20 to 30 minutes.  After esophageal manometry  When your esophageal manometry is complete, you may return to your normal activities  This test typically takes 30-45 minutes to complete. ________________________________________________________________________________  Samantha Maynard have been scheduled for an endoscopy. Please follow written instructions given to you at your visit today. If  you use inhalers (even only as needed), please bring them with you on the day of your procedure. No diabetic medications the morning of the procedure.

## 2016-08-01 NOTE — Progress Notes (Addendum)
08/01/2016 Samantha Maynard [id number] [birth year]   HISTORY OF PRESENT ILLNESS:  This is a 56 year old female who is new to our office and has been referred by Dr. Redmond Pulling at [facility] for evaluation regarding a large hiatal hernia.  She is going to have surgical repair of this but he would like and EGD and esophageal manometry done prior to surgery.  Recent UGI series as follows:  IMPRESSION: 1. Large paraesophageal hiatal hernia, as above. This limited evaluation of the distal body and antrum of the stomach, as well as the duodenal bulb secondary to remarkably slow transit of barium during the examination. 2. Nonspecific esophageal motility disorder with occasional tertiary Contractions.  She says that she had an EGD and colonoscopy in 2013 in [place], which she says were performed for evaluation regarding iron deficiency anemia. She said that she's had iron deficiency anemia at least since that time. She was still menstrating at that time and had heavy periods for several years. Now she is postmenopausal, no longer menstruating, but continues with iron deficiency anemia.  She follows with Dr. Marin Olp for her iron deficiency as well as a hypercoagulable state/clotting disorder for which she is on Coumadin. She has history of DVTs and PEs. Her most recent hemoglobin was 9.0 g with MCV of 79. Ferritin was 4, serum iron 17, iron saturation 5%, TIBC actually normal at 376. She was given 2 IV iron infusions after this labs were drawn.  She denies any dark or bloody stools.  Past Medical History:  Diagnosis Date  . ADD (attention deficit disorder)   . Anemia   . Anemia, iron deficiency 11/05/2013  . Anxiety   . Cholelithiasis    symptomatic  . Depression   . Diabetes mellitus without complication ([place])   . DVT (deep vein thrombosis) in pregnancy ([place])   . GERD (gastroesophageal reflux disease)   . Malabsorption of iron 11/05/2013  . Pulmonary emboli ([place])   . Wears glasses    Past Surgical  History:  Procedure Laterality Date  . CHOLECYSTECTOMY  09/15/2014   laproscopic   . CHOLECYSTECTOMY N/A 09/15/2014   Procedure: LAPAROSCOPIC CHOLECYSTECTOMY WITH  INTRAOPERATIVE CHOLANGIOGRAM;  Surgeon: Greer Pickerel, [place];  Location: [facility];  Service: General;  Laterality: N/A;  . COLONOSCOPY    . ESOPHAGOGASTRODUODENOSCOPY    . MOUTH SURGERY    . TONSILLECTOMY      reports that she has never smoked. She has never used smokeless tobacco. She reports that she drinks alcohol. She reports that she does not use drugs. family history includes CAD in her father; Cancer in her other; Diabetes in her other; Hypertension in her other; Stroke in her other. No Known Allergies    Outpatient Encounter Prescriptions as of 08/01/2016  Medication Sig  . acetaminophen (TYLENOL) 325 MG tablet Take 325 mg by mouth every 6 (six) hours as needed for mild pain or moderate pain.  . cetirizine (ZYRTEC) 10 MG tablet Take 10 mg by mouth daily as needed for allergies or rhinitis.  . clonazePAM (KLONOPIN) 1 MG tablet Take 1 mg by mouth at bedtime as needed (sleep).   . lisdexamfetamine (VYVANSE) 70 MG capsule Take 70 mg by mouth daily.  . metFORMIN (GLUCOPHAGE) 500 MG tablet Take 500 mg by mouth 2 (two) times daily with a meal.   . Multiple Vitamin (MULTIVITAMIN WITH MINERALS) TABS tablet Take 1 tablet by mouth daily.  . rivaroxaban (XARELTO) 20 MG TABS tablet Take 20 mg by mouth daily with  supper.  . senna (SENOKOT) 8.6 MG TABS tablet Take 2 tablets by mouth at bedtime as needed for mild constipation.   . triamcinolone ointment (KENALOG) 0.1 %   . venlafaxine XR (EFFEXOR-XR) 150 MG 24 hr capsule Take 150 mg by mouth daily at 6 PM.   . venlafaxine XR (EFFEXOR-XR) 75 MG 24 hr capsule Take 75 mg by mouth daily at 6 PM.  . [DISCONTINUED] Iron-Vitamin C (IRON 100/C PO) Take by mouth every morning.   No facility-administered encounter medications on file as of 08/01/2016.     REVIEW OF SYSTEMS  : All other systems  reviewed and negative except where noted in the History of Present Illness.   PHYSICAL EXAM: BP 124/86   Ht 5\' 2"  (1.575 m)   Wt 238 lb (108 kg)   LMP 06/04/2014   BMI 43.53 kg/m  General: Well developed white female in no acute distress Head: Normocephalic and atraumatic Eyes: Sclerae anicteric, conjunctiva pink. Ears: Normal auditory acuity Lungs: Clear throughout to auscultation Heart: Regular rate and rhythm Abdomen: Soft, non-distended.  Normal bowel sounds.  Non-tender. Musculoskeletal: Symmetrical with no gross deformities  Skin: No lesions on visible extremities Extremities: No edema  Neurological: Alert oriented x 4, grossly non-focal Psychological:  Alert and cooperative. Normal mood and affect  ASSESSMENT AND PLAN: -Large symptomatic paraesophageal hiatal hernia:  Wants to have surgical repair. Needs EGD and esophageal manometry prior to surgery. Will schedule EGD with Dr. Henrene Pastor and esophageal manometry at [facility]. -IDA:  Apparently this has been an issue for at least the past 4-5 years.  Had EGD and colonoscopy for evaluation in 2013 in [place].  Follows with Dr. Marin Olp.  No sign of overt GI bleeding.  Will request previous GI records.  ? If her anemia could be from [place] erosions/ulcers/lesions related to her hernia. -Hypercoagulable state/clotting disorder with history of DVT and PE, on Xarelto:  Will hold Xarelto for 2 days prior to endoscopic procedures - will instruct when and how to resume after procedure. Benefits and risks of procedure explained including risks of bleeding, perforation, infection, missed lesions, reactions to medications and possible need for hospitalization and surgery for complications. Additional rare but real risk of stroke or other vascular clotting events off of Xarelto also explained and need to seek urgent help if any signs of these problems occur. Will communicate by phone or EMR with patient's prescribing provider, Dr. Lysle Rubens, to confirm that  holding Xarelto is reasonable in this case.   *Received records from previous colonoscopy in September 2011 at which time she was found to have only internal hemorrhoids. Quality of the prep was good. Repeat colonoscopy was recommended in 10 years. EGD at the same time showed grade 1 esophagitis in the lower third of the esophagus compatible with mild distal esophagitis, hiatal hernia, and erythema in the antrum. Duodenum appeared normal. Biopsies were obtained of the stomach and the duodenum. Gastric biopsies showed moderate chronic inflammation of the lamina propria, negative for H. pylori. Biopsies of the duodenum showed nonspecific changes, mild to moderate chronic inflammation of the lamina propria, but negative for celiac disease. The indications for these procedures at that time was iron deficiency anemia.  CC:  Wenda Low, MD

## 2016-08-02 ENCOUNTER — Telehealth: Payer: Self-pay | Admitting: Internal Medicine

## 2016-08-02 NOTE — Telephone Encounter (Signed)
Esophageal mano with Sharee Pimple.  She needs to wait to reschedule until her next work schedule comes out.

## 2016-08-15 ENCOUNTER — Encounter (HOSPITAL_COMMUNITY): Admission: RE | Payer: Self-pay | Source: Ambulatory Visit

## 2016-08-15 ENCOUNTER — Ambulatory Visit (HOSPITAL_COMMUNITY)
Admission: RE | Admit: 2016-08-15 | Payer: Federal, State, Local not specified - PPO | Source: Ambulatory Visit | Admitting: Internal Medicine

## 2016-08-15 SURGERY — MANOMETRY, ESOPHAGUS

## 2016-08-23 DIAGNOSIS — M5432 Sciatica, left side: Secondary | ICD-10-CM | POA: Diagnosis not present

## 2016-08-29 DIAGNOSIS — M5432 Sciatica, left side: Secondary | ICD-10-CM | POA: Diagnosis not present

## 2016-08-30 DIAGNOSIS — F322 Major depressive disorder, single episode, severe without psychotic features: Secondary | ICD-10-CM | POA: Diagnosis not present

## 2016-09-03 ENCOUNTER — Other Ambulatory Visit: Payer: Federal, State, Local not specified - PPO

## 2016-09-03 ENCOUNTER — Ambulatory Visit: Payer: Federal, State, Local not specified - PPO | Admitting: Hematology & Oncology

## 2016-09-03 DIAGNOSIS — M5432 Sciatica, left side: Secondary | ICD-10-CM | POA: Diagnosis not present

## 2016-09-05 DIAGNOSIS — K080 Exfoliation of teeth due to systemic causes: Secondary | ICD-10-CM | POA: Diagnosis not present

## 2016-09-20 ENCOUNTER — Other Ambulatory Visit (HOSPITAL_BASED_OUTPATIENT_CLINIC_OR_DEPARTMENT_OTHER): Payer: Federal, State, Local not specified - PPO

## 2016-09-20 ENCOUNTER — Ambulatory Visit (HOSPITAL_BASED_OUTPATIENT_CLINIC_OR_DEPARTMENT_OTHER): Payer: Federal, State, Local not specified - PPO | Admitting: Family

## 2016-09-20 VITALS — BP 141/86 | HR 97 | Temp 98.3°F | Resp 20 | Wt 235.1 lb

## 2016-09-20 DIAGNOSIS — I2699 Other pulmonary embolism without acute cor pulmonale: Secondary | ICD-10-CM | POA: Diagnosis not present

## 2016-09-20 DIAGNOSIS — I2782 Chronic pulmonary embolism: Secondary | ICD-10-CM

## 2016-09-20 DIAGNOSIS — D509 Iron deficiency anemia, unspecified: Secondary | ICD-10-CM | POA: Diagnosis not present

## 2016-09-20 LAB — FERRITIN: Ferritin: 7 ng/ml — ABNORMAL LOW (ref 9–269)

## 2016-09-20 LAB — CMP (CANCER CENTER ONLY)
ALT(SGPT): 24 U/L (ref 10–47)
AST: 21 U/L (ref 11–38)
Albumin: 3.7 g/dL (ref 3.3–5.5)
Alkaline Phosphatase: 151 U/L — ABNORMAL HIGH (ref 26–84)
BUN, Bld: 10 mg/dL (ref 7–22)
CO2: 27 mEq/L (ref 18–33)
Calcium: 9.2 mg/dL (ref 8.0–10.3)
Chloride: 102 mEq/L (ref 98–108)
Creat: 0.8 mg/dl (ref 0.6–1.2)
Glucose, Bld: 120 mg/dL — ABNORMAL HIGH (ref 73–118)
Potassium: 3.9 mEq/L (ref 3.3–4.7)
Sodium: 142 mEq/L (ref 128–145)
Total Bilirubin: 0.5 mg/dl (ref 0.20–1.60)
Total Protein: 7.4 g/dL (ref 6.4–8.1)

## 2016-09-20 LAB — CBC WITH DIFFERENTIAL (CANCER CENTER ONLY)
BASO#: 0 10*3/uL (ref 0.0–0.2)
BASO%: 0.2 % (ref 0.0–2.0)
EOS%: 0 % (ref 0.0–7.0)
Eosinophils Absolute: 0 10*3/uL (ref 0.0–0.5)
HCT: 34.8 % (ref 34.8–46.6)
HGB: 10.7 g/dL — ABNORMAL LOW (ref 11.6–15.9)
LYMPH#: 1.8 10*3/uL (ref 0.9–3.3)
LYMPH%: 29.2 % (ref 14.0–48.0)
MCH: 23.9 pg — ABNORMAL LOW (ref 26.0–34.0)
MCHC: 30.7 g/dL — ABNORMAL LOW (ref 32.0–36.0)
MCV: 78 fL — ABNORMAL LOW (ref 81–101)
MONO#: 0.5 10*3/uL (ref 0.1–0.9)
MONO%: 8.2 % (ref 0.0–13.0)
NEUT#: 3.7 10*3/uL (ref 1.5–6.5)
NEUT%: 62.4 % (ref 39.6–80.0)
Platelets: 432 10*3/uL — ABNORMAL HIGH (ref 145–400)
RBC: 4.48 10*6/uL (ref 3.70–5.32)
RDW: 17.9 % — ABNORMAL HIGH (ref 11.1–15.7)
WBC: 6 10*3/uL (ref 3.9–10.0)

## 2016-09-20 LAB — IRON AND TIBC
%SAT: 5 % — ABNORMAL LOW (ref 21–57)
Iron: 21 ug/dL — ABNORMAL LOW (ref 41–142)
TIBC: 390 ug/dL (ref 236–444)
UIBC: 369 ug/dL (ref 120–384)

## 2016-09-20 NOTE — Progress Notes (Signed)
Hematology and Oncology Follow Up Visit  Samantha Maynard [id number] [birth year] 56 y.o. 09/20/2016   Principle Diagnosis:  Iron deficiency anemia Recurrent pulmonary emboli  Current Therapy:   IV iron as indicated - last received in February 2018 x 2 Xarelto 20 mg PO daily - Lif[facility]    Interim History:  Samantha Maynard is here today for follow-up. She states that she will be having an EGD later this month on May 24th to evaluate a hernia. She states that she will likely be having it repaired in the next month or so. She has had some SOB with this. She is frustrated that she is unable to get out and exercise with the SOB.  She last received IV iron twice in February. Her iron saturation at that time was 5%.  She has done well on Xarelto and denies having any episodes of bleeding, bruising or petechiae.  No fever, chills, n/v, cough, rash, dizziness, chest pain, palpitations, abdominal pain or changes in bowel or bladder habits.  She has some puffiness in her ankles that comes and goes. No tenderness, numbness or tingling in her extremities. No c/o pain at this time.  She has maintained a good appetite and is staying well hydrated. Her weight is stable.   ECOG Performance Status: 0 - Asymptomatic  Medications:  Allergies as of 09/20/2016   No Known Allergies     Medication List       Accurate as of 09/20/16  1:06 PM. Always use your most recent med list.          acetaminophen 325 MG tablet Commonly known as:  TYLENOL Take 325 mg by mouth every 6 (six) hours as needed for mild pain or moderate pain.   cetirizine 10 MG tablet Commonly known as:  ZYRTEC Take 10 mg by mouth daily as needed for allergies or rhinitis.   clonazePAM 1 MG tablet Commonly known as:  KLONOPIN Take 1 mg by mouth at bedtime as needed (sleep).   lisdexamfetamine 70 MG capsule Commonly known as:  VYVANSE Take 70 mg by mouth daily.   metFORMIN 500 MG tablet Commonly known as:  GLUCOPHAGE Take 500 mg by mouth  2 (two) times daily with a meal.   multivitamin with minerals Tabs tablet Take 1 tablet by mouth daily.   senna 8.6 MG Tabs tablet Commonly known as:  SENOKOT Take 2 tablets by mouth at bedtime as needed for mild constipation.   venlafaxine XR 150 MG 24 hr capsule Commonly known as:  EFFEXOR-XR Take 150 mg by mouth daily at 6 PM.   venlafaxine XR 75 MG 24 hr capsule Commonly known as:  EFFEXOR-XR Take 75 mg by mouth daily at 6 PM.   XARELTO 20 MG Tabs tablet Generic drug:  rivaroxaban Take 20 mg by mouth daily with supper.       Allergies: No Known Allergies  Past Medical History, Surgical history, Social history, and Family History were reviewed and updated.  Review of Systems: All other 10 point review of systems is negative.   Physical Exam:  weight is 235 lb 1.9 oz (106.6 kg). Her oral temperature is 98.3 F (36.8 C). Her blood pressure is 141/86 (abnormal) and her pulse is 97. Her respiration is 20 and oxygen saturation is 95%.   Wt Readings from Last 3 Encounters:  09/20/16 235 lb 1.9 oz (106.6 kg)  08/01/16 238 lb (108 kg)  07/03/16 237 lb (107.5 kg)    Ocular: Sclerae unicteric, pupils equal, round and  reactive to light Ear-nose-throat: Oropharynx clear, dentition fair Lymphatic: No cervical, supraclavicular or axillary adenopathy Lungs no rales or rhonchi, good excursion bilaterally Heart regular rate and rhythm, no murmur appreciated Abd soft, nontender, positive bowel sounds, no liver or spleen tip palpated on exam, no fluid wave MSK no focal spinal tenderness, no joint edema Neuro: non-focal, well-oriented, appropriate affect Breasts: Deferred  Lab Results  Component Value Date   WBC 6.0 09/20/2016   HGB 10.7 (L) 09/20/2016   HCT 34.8 09/20/2016   MCV 78 (L) 09/20/2016   PLT 432 (H) 09/20/2016   Lab Results  Component Value Date   FERRITIN 4 (L) 06/13/2016   IRON 17 (L) 06/13/2016   TIBC 376 06/13/2016   UIBC 359 06/13/2016   IRONPCTSAT 5  (L) 06/13/2016   Lab Results  Component Value Date   RETICCTPCT 1.2 03/25/2014   RBC 4.48 09/20/2016   RETICCTABS 60.6 03/25/2014   No results found for: KPAFRELGTCHN, LAMBDASER, KAPLAMBRATIO No results found for: IGGSERUM, IGA, IGMSERUM No results found for: Odetta Pink, SPEI   Chemistry      Component Value Date/Time   NA 142 09/20/2016 1131   NA 139 12/02/2015 1104   K 3.9 09/20/2016 1131   K 4.1 12/02/2015 1104   CL 102 09/20/2016 1131   CO2 27 09/20/2016 1131   CO2 25 12/02/2015 1104   BUN 10 09/20/2016 1131   BUN 17.5 12/02/2015 1104   CREATININE 0.8 09/20/2016 1131   CREATININE 0.9 12/02/2015 1104      Component Value Date/Time   CALCIUM 9.2 09/20/2016 1131   CALCIUM 9.3 12/02/2015 1104   ALKPHOS 151 (H) 09/20/2016 1131   ALKPHOS 135 12/02/2015 1104   AST 21 09/20/2016 1131   AST 19 12/02/2015 1104   ALT 24 09/20/2016 1131   ALT 23 12/02/2015 1104   BILITOT 0.50 09/20/2016 1131   BILITOT <0.30 12/02/2015 1104      Impression and Plan: Samantha Maynard is a very pleasant 56 yo caucasian female with iron deficeincy anemia. She is symptomatic with SOB with exertion but is attributing this to her hernia. She will follow up later this month for EGD and then determine they will determine a course of action.  We will see what her iron studies show and bring her back in next week for an infusion if needed.  Hgb is stable at 10.7 with an MCV of 78.  We will plan to see her back in 4 months for repeat lab work and follow-up.  She will contact our office with any questions or concerns. We can certainly see her sooner if need be.   Eliezer Bottom, NP 5/3/20181:06 PM

## 2016-09-21 ENCOUNTER — Telehealth: Payer: Self-pay | Admitting: *Deleted

## 2016-09-21 NOTE — Telephone Encounter (Addendum)
Patient aware of results. Appointments made  ----- Message from Eliezer Bottom, NP sent at 09/21/2016  9:34 AM EDT ----- Regarding: Iron  Iron studies still quite low. She will need 2 doses of IV iron please. LOS sent to [facility]. Thank you!  Sarah  ----- Message ----- From: Volanda Napoleon, MD Sent: 09/20/2016   3:06 PM To: Eliezer Bottom, NP    ----- Message ----- From: Interface, Lab In Three Zero One Sent: 09/20/2016  11:45 AM To: Volanda Napoleon, MD

## 2016-09-27 ENCOUNTER — Ambulatory Visit (HOSPITAL_BASED_OUTPATIENT_CLINIC_OR_DEPARTMENT_OTHER): Payer: Federal, State, Local not specified - PPO

## 2016-09-27 VITALS — BP 147/83 | HR 99 | Temp 97.8°F | Resp 18

## 2016-09-27 DIAGNOSIS — D509 Iron deficiency anemia, unspecified: Secondary | ICD-10-CM

## 2016-09-27 DIAGNOSIS — K909 Intestinal malabsorption, unspecified: Secondary | ICD-10-CM

## 2016-09-27 MED ORDER — FERUMOXYTOL INJECTION 510 MG/17 ML
510.0000 mg | Freq: Once | INTRAVENOUS | Status: AC
  Administered 2016-09-27: 510 mg via INTRAVENOUS
  Filled 2016-09-27: qty 17

## 2016-09-27 NOTE — Patient Instructions (Signed)

## 2016-10-02 ENCOUNTER — Encounter: Payer: Self-pay | Admitting: Internal Medicine

## 2016-10-05 ENCOUNTER — Ambulatory Visit (HOSPITAL_BASED_OUTPATIENT_CLINIC_OR_DEPARTMENT_OTHER): Payer: Federal, State, Local not specified - PPO

## 2016-10-05 VITALS — BP 116/98 | HR 116 | Temp 98.1°F

## 2016-10-05 DIAGNOSIS — K909 Intestinal malabsorption, unspecified: Secondary | ICD-10-CM

## 2016-10-05 DIAGNOSIS — D509 Iron deficiency anemia, unspecified: Secondary | ICD-10-CM | POA: Diagnosis not present

## 2016-10-05 MED ORDER — SODIUM CHLORIDE 0.9% FLUSH
3.0000 mL | Freq: Once | INTRAVENOUS | Status: DC | PRN
  Filled 2016-10-05: qty 10

## 2016-10-05 MED ORDER — HEPARIN SOD (PORK) LOCK FLUSH 100 UNIT/ML IV SOLN
500.0000 [IU] | Freq: Once | INTRAVENOUS | Status: DC | PRN
  Filled 2016-10-05: qty 5

## 2016-10-05 MED ORDER — SODIUM CHLORIDE 0.9 % IV SOLN
510.0000 mg | Freq: Once | INTRAVENOUS | Status: AC
  Administered 2016-10-05: 510 mg via INTRAVENOUS
  Filled 2016-10-05: qty 17

## 2016-10-11 ENCOUNTER — Ambulatory Visit (AMBULATORY_SURGERY_CENTER): Payer: Federal, State, Local not specified - PPO | Admitting: Internal Medicine

## 2016-10-11 ENCOUNTER — Encounter: Payer: Self-pay | Admitting: Internal Medicine

## 2016-10-11 VITALS — BP 108/71 | HR 73 | Temp 97.7°F | Resp 15 | Ht 62.0 in | Wt 235.0 lb

## 2016-10-11 DIAGNOSIS — K449 Diaphragmatic hernia without obstruction or gangrene: Secondary | ICD-10-CM | POA: Diagnosis not present

## 2016-10-11 DIAGNOSIS — K2100 Gastro-esophageal reflux disease with esophagitis, without bleeding: Secondary | ICD-10-CM

## 2016-10-11 DIAGNOSIS — D509 Iron deficiency anemia, unspecified: Secondary | ICD-10-CM | POA: Diagnosis not present

## 2016-10-11 MED ORDER — SODIUM CHLORIDE 0.9 % IV SOLN: 500.0000 mL | INTRAVENOUS | Status: DC

## 2016-10-11 MED ORDER — OMEPRAZOLE 20 MG PO CPDR: 20.0000 mg | DELAYED_RELEASE_CAPSULE | Freq: Every day | ORAL | 11 refills | Status: AC

## 2016-10-11 NOTE — Progress Notes (Signed)
Report to PACU, RN, vss, BBS= Clear.  

## 2016-10-11 NOTE — Patient Instructions (Signed)
YOU HAD AN ENDOSCOPIC PROCEDURE TODAY AT [facility]:   Refer to the procedure report that was given to you for any specific questions about what was found during the examination.  If the procedure report does not answer your questions, please call your gastroenterologist to clarify.  If you requested that your care partner not be given the details of your procedure findings, then the procedure report has been included in a sealed envelope for you to review at your convenience later.  YOU SHOULD EXPECT: Some feelings of bloating in the abdomen. Passage of more gas than usual.  Walking can help get rid of the air that was put into your GI tract during the procedure and reduce the bloating. If you had a lower endoscopy (such as a colonoscopy or flexible sigmoidoscopy) you may notice spotting of blood in your stool or on the toilet paper. If you underwent a bowel prep for your procedure, you may not have a normal bowel movement for a few days.  Please Note:  You might notice some irritation and congestion in your nose or some drainage.  This is from the oxygen used during your procedure.  There is no need for concern and it should clear up in a day or so.  SYMPTOMS TO REPORT IMMEDIATELY:     Following upper endoscopy (EGD)  Vomiting of blood or coffee ground material  New chest pain or pain under the shoulder blades  Painful or persistently difficult swallowing  New shortness of breath  Fever of 100F or higher  Black, tarry-looking stools  For urgent or emergent issues, a gastroenterologist can be reached at any hour by calling [phone].   DIET:  We do recommend a small meal at first, but then you may proceed to your regular diet.  Drink plenty of fluids but you should avoid alcoholic beverages for 24 hours.  ACTIVITY:  You should plan to take it easy for the rest of today and you should NOT DRIVE or use heavy machinery until tomorrow (because of the sedation medicines  used during the test).    FOLLOW UP: Our staff will call the number listed on your records the next business day following your procedure to check on you and address any questions or concerns that you may have regarding the information given to you following your procedure. If we do not reach you, we will leave a message.  However, if you are feeling well and you are not experiencing any problems, there is no need to return our call.  We will assume that you have returned to your regular daily activities without incident.  If any biopsies were taken you will be contacted by phone or by letter within the next 1-3 weeks.  Please call us at [phone] if you have not heard about the biopsies in 3 weeks.    SIGNATURES/CONFIDENTIALITY: You and/or your care partner have signed paperwork which will be entered into your electronic medical record.  These signatures attest to the fact that that the information above on your After Visit Summary has been reviewed and is understood.  Full responsibility of the confidentiality of this discharge information lies with you and/or your care-partner.   Rx for Omeprazole sent to [facility] for you to pick up  Colonoscopy scheduled  Information on Esophagitis and Reflux/hiatal hernia given to you today  Resume Xarelto today

## 2016-10-11 NOTE — Op Note (Signed)
[place] Patient Name: Samantha Maynard Procedure Date: 10/11/2016 10:29 AM MRN: [id number] Endoscopist: Docia Chuck. Henrene Pastor , MD Age: 56 Referring MD:  Date of Birth: May 10, 1961 Gender: Female Account #: [id number] Procedure:                Upper GI endoscopy Indications:              Iron deficiency anemia, Hiatal                            hernia(paraesophageal anticipating surgery). Seen                            in office recently by physician assistant.                            Recurrent iron deficiency anemia. Last colonoscopy                            elsewhere 2011. Medicines:                Monitored Anesthesia Care Procedure:                Pre-Anesthesia Assessment:                           - Prior to the procedure, a History and Physical                            was performed, and patient medications and                            allergies were reviewed. The patient's tolerance of                            previous anesthesia was also reviewed. The risks                            and benefits of the procedure and the sedation                            options and risks were discussed with the patient.                            All questions were answered, and informed consent                            was obtained. Prior Anticoagulants: The patient has                            taken Xarelto (rivaroxaban), last dose was 2 days                            prior to procedure. ASA Grade Assessment: III - A  patient with severe systemic disease. After                            reviewing the risks and benefits, the patient was                            deemed in satisfactory condition to undergo the                            procedure.                           After obtaining informed consent, the endoscope was                            passed under direct vision. Throughout the                            procedure, the  patient's blood pressure, pulse, and                            oxygen saturations were monitored continuously. The                            Endoscope was introduced through the mouth, and                            advanced to the second part of duodenum. The upper                            GI endoscopy was accomplished without difficulty.                            The patient tolerated the procedure well. Scope In: Scope Out: Findings:                 Non-severe esophagitis was found at the Z line. The                            esophagus was foreshortened.                           The esophagus was otherwise normal.                           A large hiatal hernia was present. This had a                            paraesophageal component. There are obvious Cameron                            erosions.                           The stomach was otherwise normal.  The examined duodenum was normal.                           The cardia and gastric fundus revealed the complex                            hiatal hernia and erosions on retroflexion. Complications:            No immediate complications. Estimated Blood Loss:     Estimated blood loss: none. Impression:               1. Reflux esophagitis                           2. Hiatal hernia with paraesophageal component                           3. Cameron erosions on the hiatal hernia. Likely                            cause for recurrent iron deficiency anemia                           4. Otherwise unremarkable exam. Recommendation:           1. Prescribe omeprazole 20 mg; #30; 1 by mouth                            daily; 11 refills                           2. Resume Xarelto today                           3. Schedule patient for colonoscopy (recurrent iron                            deficiency anemia) in the [place]. Hold Xarelto 2 days                            prior to the procedure                            4. Continue your ongoing follow-up with hematology                            and Gen. surgery. Docia Chuck. Henrene Pastor, MD 10/11/2016 11:03:55 AM This report has been signed electronically.

## 2016-10-12 ENCOUNTER — Telehealth: Payer: Self-pay | Admitting: *Deleted

## 2016-10-12 NOTE — Telephone Encounter (Signed)
  Follow up Call-  Call back number 10/11/2016  Post procedure Call Back phone  # [phone]  Permission to leave phone message Yes  Some recent data might be hidden     Patient questions:  Do you have a fever, pain , or abdominal swelling? No. Pain Score  0 *  Have you tolerated food without any problems? Yes.    Have you been able to return to your normal activities? Yes.    Do you have any questions about your discharge instructions: Diet   No. Medications  No. Follow up visit  No.  Do you have questions or concerns about your Care? No.  Actions: * If pain score is 4 or above: No action needed, pain <4.

## 2016-10-22 ENCOUNTER — Telehealth: Payer: Self-pay | Admitting: Internal Medicine

## 2016-10-22 NOTE — Telephone Encounter (Signed)
The surgeon Dr. Redmond Pulling wanted pre-op [facility]. OK to shedule. Have Dr. Silverio Decamp interpret when completed

## 2016-10-22 NOTE — Telephone Encounter (Signed)
Pt states she is calling to schedule a mano. Looks like pt was scheduled for an EM and it was cancelled. Is this what she needs to schedule? Pt mentioned anal mano just want to clarify. Please advise.

## 2016-10-23 ENCOUNTER — Other Ambulatory Visit: Payer: Self-pay

## 2016-10-23 NOTE — Telephone Encounter (Signed)
Pt scheduled for EM at [facility] 10/31/16@8 :30am, pt to arrive there at 8:15am. Letter mailed to pt.

## 2016-10-28 DIAGNOSIS — R0981 Nasal congestion: Secondary | ICD-10-CM | POA: Diagnosis not present

## 2016-10-28 DIAGNOSIS — H578 Other specified disorders of eye and adnexa: Secondary | ICD-10-CM | POA: Diagnosis not present

## 2016-10-28 DIAGNOSIS — H1033 Unspecified acute conjunctivitis, bilateral: Secondary | ICD-10-CM | POA: Diagnosis not present

## 2016-10-31 ENCOUNTER — Ambulatory Visit (HOSPITAL_COMMUNITY)
Admission: RE | Admit: 2016-10-31 | Discharge: 2016-10-31 | Disposition: A | Discharge status: Home / Self Care | Payer: Federal, State, Local not specified - PPO | Source: Ambulatory Visit | Attending: Gastroenterology | Admitting: Gastroenterology

## 2016-10-31 ENCOUNTER — Encounter (HOSPITAL_COMMUNITY)
Admission: RE | Disposition: A | Discharge status: Home / Self Care | Payer: Self-pay | Source: Ambulatory Visit | Attending: Gastroenterology

## 2016-10-31 DIAGNOSIS — K449 Diaphragmatic hernia without obstruction or gangrene: Secondary | ICD-10-CM

## 2016-10-31 DIAGNOSIS — R1310 Dysphagia, unspecified: Secondary | ICD-10-CM | POA: Diagnosis not present

## 2016-10-31 DIAGNOSIS — K222 Esophageal obstruction: Secondary | ICD-10-CM | POA: Insufficient documentation

## 2016-10-31 HISTORY — PX: ESOPHAGEAL MANOMETRY: SHX5429

## 2016-10-31 SURGERY — MANOMETRY, ESOPHAGUS
Anesthesia: Topical

## 2016-10-31 MED ORDER — LIDOCAINE VISCOUS 2 % MT SOLN
OROMUCOSAL | Status: AC
  Filled 2016-10-31: qty 15

## 2016-10-31 SURGICAL SUPPLY — 2 items
FACESHIELD LNG OPTICON STERILE (SAFETY) IMPLANT
GLOVE BIO SURGEON STRL SZ8 (GLOVE) ×4 IMPLANT

## 2016-11-01 ENCOUNTER — Encounter (HOSPITAL_COMMUNITY): Payer: Self-pay | Admitting: Gastroenterology

## 2016-11-02 DIAGNOSIS — K449 Diaphragmatic hernia without obstruction or gangrene: Secondary | ICD-10-CM

## 2016-11-02 DIAGNOSIS — R1310 Dysphagia, unspecified: Secondary | ICD-10-CM

## 2016-11-22 ENCOUNTER — Other Ambulatory Visit: Payer: Self-pay | Admitting: Obstetrics and Gynecology

## 2016-11-22 DIAGNOSIS — Z01411 Encounter for gynecological examination (general) (routine) with abnormal findings: Secondary | ICD-10-CM | POA: Diagnosis not present

## 2016-11-22 DIAGNOSIS — Z01419 Encounter for gynecological examination (general) (routine) without abnormal findings: Secondary | ICD-10-CM | POA: Diagnosis not present

## 2016-11-28 ENCOUNTER — Ambulatory Visit (AMBULATORY_SURGERY_CENTER): Payer: Self-pay

## 2016-11-28 VITALS — Ht 64.0 in | Wt 230.6 lb

## 2016-11-28 DIAGNOSIS — D509 Iron deficiency anemia, unspecified: Secondary | ICD-10-CM

## 2016-11-28 LAB — CYTOLOGY - PAP
Diagnosis: NEGATIVE
HPV: NOT DETECTED

## 2016-11-28 MED ORDER — SUPREP BOWEL PREP KIT 17.5-3.13-1.6 GM/177ML PO SOLN: 1.0000 | Freq: Once | ORAL | 0 refills | Status: AC

## 2016-11-28 NOTE — Progress Notes (Signed)
No allergies to eggs or soy No diet meds No home oxygen No past problems with anesthesia  Declined emmi 

## 2016-11-29 DIAGNOSIS — F322 Major depressive disorder, single episode, severe without psychotic features: Secondary | ICD-10-CM | POA: Diagnosis not present

## 2016-12-06 ENCOUNTER — Encounter: Payer: Self-pay | Admitting: Internal Medicine

## 2016-12-12 ENCOUNTER — Encounter: Payer: Self-pay | Admitting: Internal Medicine

## 2016-12-12 ENCOUNTER — Ambulatory Visit (AMBULATORY_SURGERY_CENTER): Payer: Federal, State, Local not specified - PPO | Admitting: Internal Medicine

## 2016-12-12 ENCOUNTER — Encounter: Payer: Self-pay | Admitting: *Deleted

## 2016-12-12 ENCOUNTER — Other Ambulatory Visit: Payer: Self-pay | Admitting: *Deleted

## 2016-12-12 VITALS — BP 128/79 | HR 73 | Temp 96.2°F | Resp 21 | Ht 64.0 in | Wt 230.0 lb

## 2016-12-12 DIAGNOSIS — K635 Polyp of colon: Secondary | ICD-10-CM | POA: Diagnosis not present

## 2016-12-12 DIAGNOSIS — D509 Iron deficiency anemia, unspecified: Secondary | ICD-10-CM | POA: Diagnosis present

## 2016-12-12 DIAGNOSIS — K5140 Inflammatory polyps of colon without complications: Secondary | ICD-10-CM | POA: Diagnosis not present

## 2016-12-12 DIAGNOSIS — D123 Benign neoplasm of transverse colon: Secondary | ICD-10-CM

## 2016-12-12 MED ORDER — SODIUM CHLORIDE 0.9 % IV SOLN: 500.0000 mL | INTRAVENOUS | Status: DC

## 2016-12-12 NOTE — Progress Notes (Signed)
Called to room to assist during endoscopic procedure.  Patient ID and intended procedure confirmed with present staff. Received instructions for my participation in the procedure from the performing physician.  

## 2016-12-12 NOTE — Op Note (Signed)
[facility] Patient Name: Samantha Maynard Procedure Date: 12/12/2016 2:28 PM MRN: [id number] Endoscopist: Docia Chuck. Henrene Pastor , MD Age: 56 Referring MD:  Date of Birth: [birth year] Gender: Female Account #: [id number] Procedure:                Colonoscopy, with snare polypectomy x 1 Indications:              Iron deficiency anemia. Recent EGD. Colonoscopy and                            [place] is normal. Medicines:                Monitored Anesthesia Care Procedure:                Pre-Anesthesia Assessment:                           - Prior to the procedure, a History and Physical                            was performed, and patient medications and                            allergies were reviewed. The patient's tolerance of                            previous anesthesia was also reviewed. The risks                            and benefits of the procedure and the sedation                            options and risks were discussed with the patient.                            All questions were answered, and informed consent                            was obtained. Prior Anticoagulants: The patient has                            taken Xarelto (rivaroxaban), last dose was 3 days                            prior to procedure. ASA Grade Assessment: III - A                            patient with severe systemic disease. After                            reviewing the risks and benefits, the patient was                            deemed in satisfactory condition to undergo the  procedure.                           After obtaining informed consent, the colonoscope                            was passed under direct vision. Throughout the                            procedure, the patient's blood pressure, pulse, and                            oxygen saturations were monitored continuously. The                            Colonoscope was introduced through the  anus and                            advanced to the the cecum, identified by                            appendiceal orifice and ileocecal valve. The                            ileocecal valve, appendiceal orifice, and rectum                            were photographed. The quality of the bowel                            preparation was good. The colonoscopy was performed                            without difficulty. The patient tolerated the                            procedure well. The bowel preparation used was                            SUPREP. Scope In: 2:36:45 PM Scope Out: 2:56:14 PM Scope Withdrawal Time: 0 hours 16 minutes 43 seconds  Total Procedure Duration: 0 hours 19 minutes 29 seconds  Findings:                 A 15 mm polyp was found in the transverse colon.                            The polyp was pedunculated. The polyp was removed                            with a cold snare. Resection and retrieval were                            complete. There is significant oozing at the  resection site which was treated with cautery via                            the snare with subsequent sustained hemostasis.                           Internal hemorrhoids were found during retroflexion.                           The exam was otherwise without abnormality on                            direct and retroflexion views. Complications:            No immediate complications. Estimated blood loss:                            None. Estimated Blood Loss:     Estimated blood loss: none. Impression:               - One 15 mm polyp in the transverse colon, removed                            with a cold snare. Resected and retrieved.                           - Internal hemorrhoids.                           - The examination was otherwise normal on direct                            and retroflexion views. Recommendation:           - Repeat colonoscopy in 3 (if  adenomatous)-10 (if                            inflammatory) years for surveillance.                           - Resume Xarelto (rivaroxaban) tomorrow at prior                            dose.                           - Patient has a contact number available for                            emergencies. The signs and symptoms of potential                            delayed complications were discussed with the                            patient. Return to normal activities tomorrow.  Written discharge instructions were provided to the                            patient.                           - Resume previous diet.                           - Continue present medications.                           - Await pathology results. Docia Chuck. Henrene Pastor, MD 12/12/2016 3:04:09 PM This report has been signed electronically.

## 2016-12-12 NOTE — Progress Notes (Signed)
To recovery, nreport to Marcello Moores, Therapist, sports, VSS

## 2016-12-12 NOTE — Progress Notes (Signed)
Pt's states no medical or surgical changes since previsit or office visit. 

## 2016-12-12 NOTE — Patient Instructions (Signed)
**  Handout on polyps**   YOU HAD AN ENDOSCOPIC PROCEDURE TODAY: Refer to the procedure report and other information in the discharge instructions given to you for any specific questions about what was found during the examination. If this information does not answer your questions, please call [place] office at [phone] to clarify.   YOU SHOULD EXPECT: Some feelings of bloating in the abdomen. Passage of more gas than usual. Walking can help get rid of the air that was put into your GI tract during the procedure and reduce the bloating. If you had a lower endoscopy (such as a colonoscopy or flexible sigmoidoscopy) you may notice spotting of blood in your stool or on the toilet paper. Some abdominal soreness may be present for a day or two, also.  DIET: Your first meal following the procedure should be a light meal and then it is ok to progress to your normal diet. A half-sandwich or bowl of soup is an example of a good first meal. Heavy or fried foods are harder to digest and may make you feel nauseous or bloated. Drink plenty of fluids but you should avoid alcoholic beverages for 24 hours. If you had a esophageal dilation, please see attached instructions for diet.    ACTIVITY: Your care partner should take you home directly after the procedure. You should plan to take it easy, moving slowly for the rest of the day. You can resume normal activity the day after the procedure however YOU SHOULD NOT DRIVE, use power tools, machinery or perform tasks that involve climbing or major physical exertion for 24 hours (because of the sedation medicines used during the test).   SYMPTOMS TO REPORT IMMEDIATELY: A gastroenterologist can be reached at any hour. Please call [phone]  for any of the following symptoms:  Following lower endoscopy (colonoscopy, flexible sigmoidoscopy) Excessive amounts of blood in the stool  Significant tenderness, worsening of abdominal pains  Swelling of the abdomen that is  new, acute  Fever of 100 or higher    FOLLOW UP:  If any biopsies were taken you will be contacted by phone or by letter within the next 1-3 weeks. Call [phone]  if you have not heard about the biopsies in 3 weeks.  Please also call with any specific questions about appointments or follow up tests.

## 2016-12-13 ENCOUNTER — Telehealth: Payer: Self-pay

## 2016-12-13 NOTE — Telephone Encounter (Signed)
  Follow up Call-  Call back number 12/12/2016 10/11/2016  Post procedure Call Back phone  # [phone]  Permission to leave phone message Yes Yes  Some recent data might be hidden     Patient questions:  Do you have a fever, pain , or abdominal swelling? No. Pain Score  0 *  Have you tolerated food without any problems? Yes.    Have you been able to return to your normal activities? Yes.    Do you have any questions about your discharge instructions: Diet   No. Medications  No. Follow up visit  No.  Do you have questions or concerns about your Care? No.  Actions: * If pain score is 4 or above: No action needed, pain <4.

## 2016-12-14 DIAGNOSIS — Z86711 Personal history of pulmonary embolism: Secondary | ICD-10-CM | POA: Diagnosis not present

## 2016-12-14 DIAGNOSIS — Z7901 Long term (current) use of anticoagulants: Secondary | ICD-10-CM | POA: Diagnosis not present

## 2016-12-14 DIAGNOSIS — K449 Diaphragmatic hernia without obstruction or gangrene: Secondary | ICD-10-CM | POA: Diagnosis not present

## 2016-12-17 ENCOUNTER — Encounter: Payer: Self-pay | Admitting: Internal Medicine

## 2016-12-19 ENCOUNTER — Ambulatory Visit: Payer: Self-pay | Admitting: General Surgery

## 2016-12-27 DIAGNOSIS — E119 Type 2 diabetes mellitus without complications: Secondary | ICD-10-CM | POA: Diagnosis not present

## 2016-12-27 DIAGNOSIS — Z0000 Encounter for general adult medical examination without abnormal findings: Secondary | ICD-10-CM | POA: Diagnosis not present

## 2017-01-09 DIAGNOSIS — K080 Exfoliation of teeth due to systemic causes: Secondary | ICD-10-CM | POA: Diagnosis not present

## 2017-01-23 ENCOUNTER — Other Ambulatory Visit (HOSPITAL_BASED_OUTPATIENT_CLINIC_OR_DEPARTMENT_OTHER): Payer: Federal, State, Local not specified - PPO

## 2017-01-23 ENCOUNTER — Ambulatory Visit (HOSPITAL_BASED_OUTPATIENT_CLINIC_OR_DEPARTMENT_OTHER): Payer: Federal, State, Local not specified - PPO | Admitting: Hematology & Oncology

## 2017-01-23 VITALS — BP 135/81 | HR 99 | Temp 98.0°F | Resp 18 | Wt 232.8 lb

## 2017-01-23 DIAGNOSIS — K909 Intestinal malabsorption, unspecified: Secondary | ICD-10-CM

## 2017-01-23 DIAGNOSIS — D509 Iron deficiency anemia, unspecified: Secondary | ICD-10-CM

## 2017-01-23 DIAGNOSIS — Z7901 Long term (current) use of anticoagulants: Secondary | ICD-10-CM | POA: Diagnosis not present

## 2017-01-23 DIAGNOSIS — D500 Iron deficiency anemia secondary to blood loss (chronic): Secondary | ICD-10-CM

## 2017-01-23 DIAGNOSIS — Z86711 Personal history of pulmonary embolism: Secondary | ICD-10-CM

## 2017-01-23 LAB — CBC WITH DIFFERENTIAL (CANCER CENTER ONLY)
BASO#: 0 10*3/uL (ref 0.0–0.2)
BASO%: 0.2 % (ref 0.0–2.0)
EOS%: 0.2 % (ref 0.0–7.0)
Eosinophils Absolute: 0 10*3/uL (ref 0.0–0.5)
HCT: 36.7 % (ref 34.8–46.6)
HGB: 11.5 g/dL — ABNORMAL LOW (ref 11.6–15.9)
LYMPH#: 1.6 10*3/uL (ref 0.9–3.3)
LYMPH%: 28 % (ref 14.0–48.0)
MCH: 25.2 pg — ABNORMAL LOW (ref 26.0–34.0)
MCHC: 31.3 g/dL — ABNORMAL LOW (ref 32.0–36.0)
MCV: 81 fL (ref 81–101)
MONO#: 0.4 10*3/uL (ref 0.1–0.9)
MONO%: 7.3 % (ref 0.0–13.0)
NEUT#: 3.6 10*3/uL (ref 1.5–6.5)
NEUT%: 64.3 % (ref 39.6–80.0)
Platelets: 352 10*3/uL (ref 145–400)
RBC: 4.56 10*6/uL (ref 3.70–5.32)
RDW: 15.9 % — ABNORMAL HIGH (ref 11.1–15.7)
WBC: 5.6 10*3/uL (ref 3.9–10.0)

## 2017-01-23 LAB — COMPREHENSIVE METABOLIC PANEL (CC13)
ALT: 16 IU/L (ref 0–32)
AST (SGOT): 15 IU/L (ref 0–40)
Albumin, Serum: 4.3 g/dL (ref 3.5–5.5)
Albumin/Globulin Ratio: 1.3 (ref 1.2–2.2)
Alkaline Phosphatase, S: 134 IU/L — ABNORMAL HIGH (ref 39–117)
BUN/Creatinine Ratio: 12 (ref 9–23)
BUN: 11 mg/dL (ref 6–24)
Bilirubin Total: <0.2 mg/dL (ref 0.0–1.2)
Calcium, Ser: 9.8 mg/dL (ref 8.7–10.2)
Carbon Dioxide, Total: 27 mmol/L (ref 20–29)
Chloride, Ser: 98 mmol/L (ref 96–106)
Creatinine, Ser: 0.93 mg/dL (ref 0.57–1.00)
GFR calc Af Amer: 80 mL/min/{1.73_m2} (ref >59)
GFR calc non Af Amer: 69 mL/min/{1.73_m2} (ref >59)
Globulin, Total: 3.4 g/dL (ref 1.5–4.5)
Glucose: 133 mg/dL — ABNORMAL HIGH (ref 65–99)
Potassium, Ser: 4.3 mmol/L (ref 3.5–5.2)
Sodium: 140 mmol/L (ref 134–144)
Total Protein: 7.7 g/dL (ref 6.0–8.5)

## 2017-01-23 NOTE — Progress Notes (Signed)
Hematology and Oncology Follow Up Visit  Samantha Maynard [id number] 08/11/1960 56 y.o. 01/23/2017   Principle Diagnosis:  Iron deficiency anemia Recurrent pulmonary emboli  Current Therapy:   IV iron as indicated - last received in May 2018 x 2 Xarelto 20 mg PO daily - Lifelong    Interim History:  Samantha Maynard is here for follow-up. The big news is that she will have abdominal surgery on September 20. She will have a hiatal hernia repaired.  She is on Xarelto. She's doing well with Xarelto. I don't believe there have been any issues with respect to pulmonary emboli on for several years.  I told her to stop the Xarelto 2 days before her procedure. She restart the Xarelto the day after.  She has responded very to IV iron. She had iron studies back in May. Her ferritin was only 7 with iron saturation of 5%. She received Feraheme. She responded well with Feraheme.  She is still working. She's had no problems with bleeding. She's had no nausea or vomiting. She's had no cough. There's been no rashes. She's had no leg swelling.  Overall, her performance status is ECOG 0.    Medications:  Allergies as of 01/23/2017   No Known Allergies     Medication List       Accurate as of 01/23/17  2:29 PM. Always use your most recent med list.          acetaminophen 325 MG tablet Commonly known as:  TYLENOL Take 325 mg by mouth every 6 (six) hours as needed for mild pain or moderate pain.   clonazePAM 1 MG tablet Commonly known as:  KLONOPIN Take 1 mg by mouth at bedtime as needed (sleep).   lisdexamfetamine 70 MG capsule Commonly known as:  VYVANSE Take 70 mg by mouth daily.   loratadine 10 MG tablet Commonly known as:  CLARITIN Take 10 mg by mouth daily.   metFORMIN 500 MG tablet Commonly known as:  GLUCOPHAGE Take 500 mg by mouth 2 (two) times daily with a meal.   multivitamin with minerals Tabs tablet Take 1 tablet by mouth daily.   omeprazole 20 MG capsule Commonly known as:   PRILOSEC Take 1 capsule (20 mg total) by mouth daily.   ondansetron 4 MG tablet Commonly known as:  ZOFRAN Take 4 mg by mouth every 8 (eight) hours as needed for nausea or vomiting.   senna 8.6 MG Tabs tablet Commonly known as:  SENOKOT Take 2 tablets by mouth at bedtime as needed for mild constipation.   venlafaxine XR 150 MG 24 hr capsule Commonly known as:  EFFEXOR-XR Take 150 mg by mouth daily at 6 PM.   venlafaxine XR 75 MG 24 hr capsule Commonly known as:  EFFEXOR-XR Take 75 mg by mouth daily at 6 PM.   XARELTO 20 MG Tabs tablet Generic drug:  rivaroxaban Take 20 mg by mouth daily with supper.       Allergies: No Known Allergies  Past Medical History, Surgical history, Social history, and Family History were reviewed and updated.  Review of Systems: As stated in the interim history   Physical Exam:  weight is 232 lb 12.8 oz (105.6 kg). Her oral temperature is 98 F (36.7 C). Her blood pressure is 135/81 and her pulse is 99. Her respiration is 18 and oxygen saturation is 100%.   Wt Readings from Last 3 Encounters:  01/23/17 232 lb 12.8 oz (105.6 kg)  12/12/16 230 lb (104.3 kg)  11/28/16  230 lb 9.6 oz (104.6 kg)    Physical Exam  Constitutional: She is oriented to person, place, and time.  HENT:  Head: Normocephalic and atraumatic.  Mouth/Throat: Oropharynx is clear and moist.  Eyes: Pupils are equal, round, and reactive to light. EOM are normal.  Neck: Normal range of motion.  Cardiovascular: Normal rate, regular rhythm and normal heart sounds.   Pulmonary/Chest: Effort normal and breath sounds normal.  Abdominal: Soft. Bowel sounds are normal.  Musculoskeletal: Normal range of motion. She exhibits no edema, tenderness or deformity.  Lymphadenopathy:    She has no cervical adenopathy.  Neurological: She is alert and oriented to person, place, and time.  Skin: Skin is warm and dry. No rash noted. No erythema.  Psychiatric: She has a normal mood and  affect. Her behavior is normal. Judgment and thought content normal.  Vitals reviewed.    Lab Results  Component Value Date   WBC 5.6 01/23/2017   HGB 11.5 (L) 01/23/2017   HCT 36.7 01/23/2017   MCV 81 01/23/2017   PLT 352 01/23/2017   Lab Results  Component Value Date   FERRITIN 7 (L) 09/20/2016   IRON 21 (L) 09/20/2016   TIBC 390 09/20/2016   UIBC 369 09/20/2016   IRONPCTSAT 5 (L) 09/20/2016   Lab Results  Component Value Date   RETICCTPCT 1.2 03/25/2014   RBC 4.56 01/23/2017   RETICCTABS 60.6 03/25/2014   No results found for: KPAFRELGTCHN, LAMBDASER, KAPLAMBRATIO No results found for: IGGSERUM, IGA, IGMSERUM No results found for: Odetta Pink, SPEI   Chemistry      Component Value Date/Time   NA 142 09/20/2016 1131   NA 139 12/02/2015 1104   K 3.9 09/20/2016 1131   K 4.1 12/02/2015 1104   CL 102 09/20/2016 1131   CO2 27 09/20/2016 1131   CO2 25 12/02/2015 1104   BUN 10 09/20/2016 1131   BUN 17.5 12/02/2015 1104   CREATININE 0.8 09/20/2016 1131   CREATININE 0.9 12/02/2015 1104      Component Value Date/Time   CALCIUM 9.2 09/20/2016 1131   CALCIUM 9.3 12/02/2015 1104   ALKPHOS 151 (H) 09/20/2016 1131   ALKPHOS 135 12/02/2015 1104   AST 21 09/20/2016 1131   AST 19 12/02/2015 1104   ALT 24 09/20/2016 1131   ALT 23 12/02/2015 1104   BILITOT 0.50 09/20/2016 1131   BILITOT <0.30 12/02/2015 1104      Impression and Plan: Samantha Maynard is a very pleasant 56 year old white female. She has iron deficiency anemia. She is on Xarelto-lifelong.  She is going to have her hiatal hernia surgery on September 20. I told her to stop her Xarelto on September 17. From my point of view, she does not need to be "bridge" "with Lovenox. She can restart the Xarelto on the 21st. She really will not be a mobile. I'm sure she will be out of bed the day of surgery.  We will see what her iron studies show. Her MCV is still a little  on the lower side so it would not sprightly [place] give her another dose of iron. can certainly see her sooner if need be.   Volanda Napoleon, MD 9/5/20182:29 PM

## 2017-01-24 LAB — IRON AND TIBC
%SAT: 6 % — ABNORMAL LOW (ref 21–57)
Iron: 23 ug/dL — ABNORMAL LOW (ref 41–142)
TIBC: 364 ug/dL (ref 236–444)
UIBC: 341 ug/dL (ref 120–384)

## 2017-01-24 LAB — RETICULOCYTES: Reticulocyte Count: 2 % (ref 0.6–2.6)

## 2017-01-24 LAB — FERRITIN: Ferritin: 7 ng/ml — ABNORMAL LOW (ref 9–269)

## 2017-01-29 ENCOUNTER — Telehealth: Payer: Self-pay | Admitting: *Deleted

## 2017-01-29 NOTE — Progress Notes (Addendum)
01-23-17 (EPIC) CBC w/Diff, CMP  12-27-16 HGA1C 6.8 on chart from Dr. Enedina Finner Physician   07-04-16 [facility]) Stress Test

## 2017-01-29 NOTE — Telephone Encounter (Signed)
-----   Message from Eliezer Bottom, NP sent at 01/28/2017  4:55 PM EDT ----- Regarding: Iron  She needs 2 doses of IV iron.  LOS sent to [facility]. Thanks!  Sarah  ----- Message ----- From: Interface, Lab In Three Zero One Sent: 01/23/2017   1:46 PM To: Eliezer Bottom, NP

## 2017-01-29 NOTE — Patient Instructions (Addendum)
Samantha Maynard  01/29/2017   Your procedure is scheduled on: 02-07-17  Report to [facility] Main  Entrance Take [place]  Elevators to 3rd floor to  [place] at 5:30 AM.   Call this number if you have problems the morning of surgery [phone]    Remember: ONLY 1 PERSON MAY GO WITH YOU TO SHORT STAY TO GET  READY MORNING OF Samantha Maynard.  Do not eat food or drink liquids :After Midnight.     Take these medicines the morning of surgery with A SIP OF WATER: None per pt's preference DO NOT TAKE ANY DIABETIC MEDICATIONS DAY OF YOUR SURGERY                               You may not have any metal on your body including hair pins and              piercings  Do not wear jewelry, make-up, lotions, powders or perfumes, deodorant             Do not wear nail polish.  Do not shave  48 hours prior to surgery.              Do not bring valuables to the hospital. [place].  Contacts, dentures or bridgework may not be worn into surgery.  Leave suitcase in the car. After surgery it may be brought to your room.                 Please read over the following fact sheets you were given: _____________________________________________________________________  How to Manage Your Diabetes Before and After Surgery  Why is it important to control my blood sugar before and after surgery? . Improving blood sugar levels before and after surgery helps healing and can limit problems. . A way of improving blood sugar control is eating a healthy diet by: o  Eating less sugar and carbohydrates o  Increasing activity/exercise o  Talking with your doctor about reaching your blood sugar goals . High blood sugars (greater than 180 mg/dL) can raise your risk of infections and slow your recovery, so you will need to focus on controlling your diabetes during the weeks before surgery. . Make sure that the doctor who takes care of your  diabetes knows about your planned surgery including the date and location.  How do I manage my blood sugar before surgery? . Check your blood sugar at least 4 times a day, starting 2 days before surgery, to make sure that the level is not too high or low. o Check your blood sugar the morning of your surgery when you wake up and every 2 hours until you get to the Short Stay unit. . If your blood sugar is less than 70 mg/dL, you will need to treat for low blood sugar: o Do not take insulin. o Treat a low blood sugar (less than 70 mg/dL) with  cup of clear juice (cranberry or apple), 4 glucose tablets, OR glucose gel. o Recheck blood sugar in 15 minutes after treatment (to make sure it is greater than 70 mg/dL). If your blood sugar is not greater than 70 mg/dL on recheck, call [phone] for further instructions. . Report your blood sugar  to the short stay nurse when you get to Short Stay.  . If you are admitted to the hospital after surgery: o Your blood sugar will be checked by the staff and you will probably be given insulin after surgery (instead of oral diabetes medicines) to make sure you have good blood sugar levels. o The goal for blood sugar control after surgery is 80-180 mg/dL.   WHAT DO I DO ABOUT MY DIABETES MEDICATION?  Marland Kitchen Do not take oral diabetes medicines (pills) the morning of surgery.  . THE DAY BEFORE SURGERY, take your usual dose of Metformin        Patient Signature:  Date:   Nurse Signature:  Date:   Reviewed and Endorsed by [facility] Patient Education Committee, August 2015           [facility] - Preparing for Surgery Before surgery, you can play an important role.  Because skin is not sterile, your skin needs to be as free of germs as possible.  You can reduce the number of germs on your skin by washing with CHG (chlorahexidine gluconate) soap before surgery.  CHG is an antiseptic cleaner which kills germs and bonds with the skin to continue killing germs  even after washing. Please DO NOT use if you have an allergy to CHG or antibacterial soaps.  If your skin becomes reddened/irritated stop using the CHG and inform your nurse when you arrive at Short Stay. Do not shave (including legs and underarms) for at least 48 hours prior to the first CHG shower.  You may shave your face/neck. Please follow these instructions carefully:  1.  Shower with CHG Soap the night before surgery and the  morning of Surgery.  2.  If you choose to wash your hair, wash your hair first as usual with your  normal  shampoo.  3.  After you shampoo, rinse your hair and body thoroughly to remove the  shampoo.                           4.  Use CHG as you would any other liquid soap.  You can apply chg directly  to the skin and wash                       Gently with a scrungie or clean washcloth.  5.  Apply the CHG Soap to your body ONLY FROM THE NECK DOWN.   Do not use on face/ open                           Wound or open sores. Avoid contact with eyes, ears mouth and genitals (private parts).                       Wash face,  Genitals (private parts) with your normal soap.             6.  Wash thoroughly, paying special attention to the area where your surgery  will be performed.  7.  Thoroughly rinse your body with warm water from the neck down.  8.  DO NOT shower/wash with your normal soap after using and rinsing off  the CHG Soap.                9.  Pat yourself dry with a clean towel.  10.  Wear clean pajamas.            11.  Place clean sheets on your bed the night of your first shower and do not  sleep with pets. Day of Surgery : Do not apply any lotions/deodorants the morning of surgery.  Please wear clean clothes to the hospital/surgery center.  FAILURE TO FOLLOW THESE INSTRUCTIONS MAY RESULT IN THE CANCELLATION OF YOUR SURGERY PATIENT SIGNATURE_________________________________  NURSE  SIGNATURE__________________________________  ________________________________________________________________________  WHAT IS A BLOOD TRANSFUSION? Blood Transfusion Information  A transfusion is the replacement of blood or some of its parts. Blood is made up of multiple cells which provide different functions.  Red blood cells carry oxygen and are used for blood loss replacement.  White blood cells fight against infection.  Platelets control bleeding.  Plasma helps clot blood.  Other blood products are available for specialized needs, such as hemophilia or other clotting disorders. BEFORE THE TRANSFUSION  Who gives blood for transfusions?   Healthy volunteers who are fully evaluated to make sure their blood is safe. This is blood bank blood. Transfusion therapy is the safest it has ever been in the practice of medicine. Before blood is taken from a donor, a complete history is taken to make sure that person has no history of diseases nor engages in risky social behavior (examples are intravenous drug use or sexual activity with multiple partners). The donor's travel history is screened to minimize risk of transmitting infections, such as malaria. The donated blood is tested for signs of infectious diseases, such as HIV and hepatitis. The blood is then tested to be sure it is compatible with you in order to minimize the chance of a transfusion reaction. If you or a relative donates blood, this is often done in anticipation of surgery and is not appropriate for emergency situations. It takes many days to process the donated blood. RISKS AND COMPLICATIONS Although transfusion therapy is very safe and saves many lives, the main dangers of transfusion include:   Getting an infectious disease.  Developing a transfusion reaction. This is an allergic reaction to something in the blood you were given. Every precaution is taken to prevent this. The decision to have a blood transfusion has been  considered carefully by your caregiver before blood is given. Blood is not given unless the benefits outweigh the risks. AFTER THE TRANSFUSION  Right after receiving a blood transfusion, you will usually feel much better and more energetic. This is especially true if your red blood cells have gotten low (anemic). The transfusion raises the level of the red blood cells which carry oxygen, and this usually causes an energy increase.  The nurse administering the transfusion will monitor you carefully for complications. HOME CARE INSTRUCTIONS  No special instructions are needed after a transfusion. You may find your energy is better. Speak with your caregiver about any limitations on activity for underlying diseases you may have. SEEK MEDICAL CARE IF:   Your condition is not improving after your transfusion.  You develop redness or irritation at the intravenous (IV) site. SEEK IMMEDIATE MEDICAL CARE IF:  Any of the following symptoms occur over the next 12 hours:  Shaking chills.  You have a temperature by mouth above 102 F (38.9 C), not controlled by medicine.  Chest, back, or muscle pain.  People around you feel you are not acting correctly or are confused.  Shortness of breath or difficulty breathing.  Dizziness and fainting.  You get a rash or develop  hives.  You have a decrease in urine output.  Your urine turns a dark color or changes to pink, red, or brown. Any of the following symptoms occur over the next 10 days:  You have a temperature by mouth above 102 F (38.9 C), not controlled by medicine.  Shortness of breath.  Weakness after normal activity.  The white part of the eye turns yellow (jaundice).  You have a decrease in the amount of urine or are urinating less often.  Your urine turns a dark color or changes to pink, red, or brown. Document Released: 05/04/2000 Document Revised: 07/30/2011 Document Reviewed: 12/22/2007 [facility] Patient Information 2014  [place], [place].  _______________________________________________________________________

## 2017-01-30 ENCOUNTER — Other Ambulatory Visit: Payer: Self-pay

## 2017-01-30 ENCOUNTER — Encounter (HOSPITAL_COMMUNITY): Payer: Self-pay

## 2017-01-30 ENCOUNTER — Encounter (HOSPITAL_COMMUNITY)
Admission: RE | Admit: 2017-01-30 | Discharge: 2017-01-30 | Disposition: A | Discharge status: Home / Self Care | Payer: Federal, State, Local not specified - PPO | Source: Ambulatory Visit | Attending: General Surgery | Admitting: General Surgery

## 2017-01-30 DIAGNOSIS — K449 Diaphragmatic hernia without obstruction or gangrene: Secondary | ICD-10-CM | POA: Diagnosis not present

## 2017-01-30 DIAGNOSIS — E119 Type 2 diabetes mellitus without complications: Secondary | ICD-10-CM | POA: Insufficient documentation

## 2017-01-30 DIAGNOSIS — Z01818 Encounter for other preprocedural examination: Secondary | ICD-10-CM | POA: Insufficient documentation

## 2017-01-30 LAB — GLUCOSE, CAPILLARY: Glucose-Capillary: 116 mg/dL — ABNORMAL HIGH (ref 65–99)

## 2017-02-06 ENCOUNTER — Ambulatory Visit (HOSPITAL_BASED_OUTPATIENT_CLINIC_OR_DEPARTMENT_OTHER): Payer: Federal, State, Local not specified - PPO

## 2017-02-06 VITALS — BP 122/74 | HR 83 | Temp 98.0°F | Resp 17

## 2017-02-06 DIAGNOSIS — D509 Iron deficiency anemia, unspecified: Secondary | ICD-10-CM | POA: Diagnosis not present

## 2017-02-06 DIAGNOSIS — K909 Intestinal malabsorption, unspecified: Secondary | ICD-10-CM

## 2017-02-06 MED ORDER — SODIUM CHLORIDE 0.9 % IV SOLN
510.0000 mg | Freq: Once | INTRAVENOUS | Status: AC
  Administered 2017-02-06: 510 mg via INTRAVENOUS
  Filled 2017-02-06: qty 17

## 2017-02-07 ENCOUNTER — Ambulatory Visit (HOSPITAL_COMMUNITY): Payer: Federal, State, Local not specified - PPO | Admitting: Registered Nurse

## 2017-02-07 ENCOUNTER — Observation Stay (HOSPITAL_COMMUNITY)
Admission: RE | Admit: 2017-02-07 | Discharge: 2017-02-09 | Disposition: A | Discharge status: Home / Self Care | Payer: Federal, State, Local not specified - PPO | Source: Ambulatory Visit | Attending: General Surgery | Admitting: General Surgery

## 2017-02-07 ENCOUNTER — Encounter (HOSPITAL_COMMUNITY): Payer: Self-pay | Admitting: *Deleted

## 2017-02-07 ENCOUNTER — Encounter (HOSPITAL_COMMUNITY)
Admission: RE | Disposition: A | Discharge status: Home / Self Care | Payer: Self-pay | Source: Ambulatory Visit | Attending: General Surgery

## 2017-02-07 DIAGNOSIS — K449 Diaphragmatic hernia without obstruction or gangrene: Principal | ICD-10-CM | POA: Insufficient documentation

## 2017-02-07 DIAGNOSIS — Z8249 Family history of ischemic heart disease and other diseases of the circulatory system: Secondary | ICD-10-CM | POA: Diagnosis not present

## 2017-02-07 DIAGNOSIS — Z9049 Acquired absence of other specified parts of digestive tract: Secondary | ICD-10-CM | POA: Diagnosis not present

## 2017-02-07 DIAGNOSIS — F329 Major depressive disorder, single episode, unspecified: Secondary | ICD-10-CM | POA: Diagnosis not present

## 2017-02-07 DIAGNOSIS — Z7984 Long term (current) use of oral hypoglycemic drugs: Secondary | ICD-10-CM | POA: Diagnosis not present

## 2017-02-07 DIAGNOSIS — Z86711 Personal history of pulmonary embolism: Secondary | ICD-10-CM | POA: Insufficient documentation

## 2017-02-07 DIAGNOSIS — Z833 Family history of diabetes mellitus: Secondary | ICD-10-CM | POA: Insufficient documentation

## 2017-02-07 DIAGNOSIS — Z86718 Personal history of other venous thrombosis and embolism: Secondary | ICD-10-CM | POA: Insufficient documentation

## 2017-02-07 DIAGNOSIS — Z823 Family history of stroke: Secondary | ICD-10-CM | POA: Insufficient documentation

## 2017-02-07 DIAGNOSIS — F988 Other specified behavioral and emotional disorders with onset usually occurring in childhood and adolescence: Secondary | ICD-10-CM | POA: Diagnosis not present

## 2017-02-07 DIAGNOSIS — F419 Anxiety disorder, unspecified: Secondary | ICD-10-CM | POA: Insufficient documentation

## 2017-02-07 DIAGNOSIS — Z800 Family history of malignant neoplasm of digestive organs: Secondary | ICD-10-CM | POA: Insufficient documentation

## 2017-02-07 DIAGNOSIS — K909 Intestinal malabsorption, unspecified: Secondary | ICD-10-CM | POA: Insufficient documentation

## 2017-02-07 DIAGNOSIS — Z6841 Body Mass Index (BMI) 40.0 and over, adult: Secondary | ICD-10-CM | POA: Insufficient documentation

## 2017-02-07 DIAGNOSIS — E119 Type 2 diabetes mellitus without complications: Secondary | ICD-10-CM | POA: Insufficient documentation

## 2017-02-07 DIAGNOSIS — Z7901 Long term (current) use of anticoagulants: Secondary | ICD-10-CM | POA: Insufficient documentation

## 2017-02-07 DIAGNOSIS — K219 Gastro-esophageal reflux disease without esophagitis: Secondary | ICD-10-CM | POA: Insufficient documentation

## 2017-02-07 DIAGNOSIS — Z79899 Other long term (current) drug therapy: Secondary | ICD-10-CM | POA: Insufficient documentation

## 2017-02-07 DIAGNOSIS — D509 Iron deficiency anemia, unspecified: Secondary | ICD-10-CM | POA: Insufficient documentation

## 2017-02-07 LAB — GLUCOSE, CAPILLARY
Glucose-Capillary: 114 mg/dL — ABNORMAL HIGH (ref 65–99)
Glucose-Capillary: 186 mg/dL — ABNORMAL HIGH (ref 65–99)

## 2017-02-07 LAB — TYPE AND SCREEN
ABO/RH(D): O POS
Antibody Screen: NEGATIVE

## 2017-02-07 SURGERY — REPAIR, HERNIA, PARAESOPHAGEAL, LAPAROSCOPIC
Anesthesia: General | Site: Abdomen

## 2017-02-07 MED ORDER — LACTATED RINGERS IV SOLN
INTRAVENOUS | Status: DC | PRN
  Administered 2017-02-07: 07:00:00 via INTRAVENOUS

## 2017-02-07 MED ORDER — CEFOTETAN DISODIUM-DEXTROSE 2-2.08 GM-% IV SOLR
2.0000 g | INTRAVENOUS | Status: AC
  Administered 2017-02-07: 2 g via INTRAVENOUS

## 2017-02-07 MED ORDER — CHLORHEXIDINE GLUCONATE 4 % EX LIQD: 60.0000 mL | Freq: Once | CUTANEOUS | Status: DC

## 2017-02-07 MED ORDER — SODIUM CHLORIDE 0.9 % IJ SOLN
INTRAMUSCULAR | Status: DC | PRN
  Administered 2017-02-07: 50 mL

## 2017-02-07 MED ORDER — CEFOTETAN DISODIUM-DEXTROSE 2-2.08 GM-% IV SOLR
INTRAVENOUS | Status: AC
  Filled 2017-02-07: qty 50

## 2017-02-07 MED ORDER — ACETAMINOPHEN 10 MG/ML IV SOLN
1000.0000 mg | Freq: Four times a day (QID) | INTRAVENOUS | Status: AC
  Administered 2017-02-07 – 2017-02-08 (×4): 1000 mg via INTRAVENOUS
  Filled 2017-02-07 (×4): qty 100

## 2017-02-07 MED ORDER — PROPOFOL 10 MG/ML IV BOLUS
INTRAVENOUS | Status: DC | PRN
  Administered 2017-02-07: 200 mg via INTRAVENOUS

## 2017-02-07 MED ORDER — ROCURONIUM BROMIDE 50 MG/5ML IV SOSY
PREFILLED_SYRINGE | INTRAVENOUS | Status: AC
  Filled 2017-02-07: qty 5

## 2017-02-07 MED ORDER — MORPHINE SULFATE (PF) 2 MG/ML IV SOLN
1.0000 mg | INTRAVENOUS | Status: DC | PRN
  Administered 2017-02-07 – 2017-02-08 (×2): 2 mg via INTRAVENOUS
  Filled 2017-02-07 (×2): qty 1

## 2017-02-07 MED ORDER — SODIUM CHLORIDE 0.9 % IJ SOLN
INTRAMUSCULAR | Status: AC
  Filled 2017-02-07: qty 50

## 2017-02-07 MED ORDER — PROMETHAZINE HCL 25 MG/ML IJ SOLN: 12.5000 mg | Freq: Four times a day (QID) | INTRAMUSCULAR | Status: DC | PRN

## 2017-02-07 MED ORDER — MIDAZOLAM HCL 2 MG/2ML IJ SOLN
INTRAMUSCULAR | Status: AC
  Filled 2017-02-07: qty 2

## 2017-02-07 MED ORDER — BUPIVACAINE LIPOSOME 1.3 % IJ SUSP
20.0000 mL | Freq: Once | INTRAMUSCULAR | Status: AC
  Administered 2017-02-07: 20 mL
  Filled 2017-02-07: qty 20

## 2017-02-07 MED ORDER — SUGAMMADEX SODIUM 200 MG/2ML IV SOLN
INTRAVENOUS | Status: AC
  Filled 2017-02-07: qty 2

## 2017-02-07 MED ORDER — ONDANSETRON HCL 4 MG/2ML IJ SOLN
INTRAMUSCULAR | Status: AC
  Filled 2017-02-07: qty 2

## 2017-02-07 MED ORDER — GABAPENTIN 300 MG PO CAPS
300.0000 mg | ORAL_CAPSULE | ORAL | Status: AC
  Administered 2017-02-07: 300 mg via ORAL
  Filled 2017-02-07: qty 1

## 2017-02-07 MED ORDER — PHENYLEPHRINE 40 MCG/ML (10ML) SYRINGE FOR IV PUSH (FOR BLOOD PRESSURE SUPPORT)
PREFILLED_SYRINGE | INTRAVENOUS | Status: DC | PRN
  Administered 2017-02-07: 80 ug via INTRAVENOUS

## 2017-02-07 MED ORDER — ONDANSETRON HCL 4 MG/2ML IJ SOLN
4.0000 mg | Freq: Four times a day (QID) | INTRAMUSCULAR | Status: DC
  Administered 2017-02-08: 4 mg via INTRAVENOUS
  Filled 2017-02-07 (×2): qty 2

## 2017-02-07 MED ORDER — MIDAZOLAM HCL 5 MG/5ML IJ SOLN
INTRAMUSCULAR | Status: DC | PRN
  Administered 2017-02-07: 2 mg via INTRAVENOUS

## 2017-02-07 MED ORDER — ROCURONIUM BROMIDE 10 MG/ML (PF) SYRINGE
PREFILLED_SYRINGE | INTRAVENOUS | Status: DC | PRN
  Administered 2017-02-07: 10 mg via INTRAVENOUS
  Administered 2017-02-07: 20 mg via INTRAVENOUS
  Administered 2017-02-07: 50 mg via INTRAVENOUS
  Administered 2017-02-07: 10 mg via INTRAVENOUS

## 2017-02-07 MED ORDER — DIPHENHYDRAMINE HCL 50 MG/ML IJ SOLN: 12.5000 mg | Freq: Three times a day (TID) | INTRAMUSCULAR | Status: DC | PRN

## 2017-02-07 MED ORDER — LIDOCAINE 2% (20 MG/ML) 5 ML SYRINGE
INTRAMUSCULAR | Status: AC
  Filled 2017-02-07: qty 5

## 2017-02-07 MED ORDER — PANTOPRAZOLE SODIUM 40 MG IV SOLR
40.0000 mg | Freq: Every day | INTRAVENOUS | Status: DC
  Administered 2017-02-07 – 2017-02-08 (×2): 40 mg via INTRAVENOUS
  Filled 2017-02-07 (×2): qty 40

## 2017-02-07 MED ORDER — DEXAMETHASONE SODIUM PHOSPHATE 10 MG/ML IJ SOLN
INTRAMUSCULAR | Status: AC
  Filled 2017-02-07: qty 1

## 2017-02-07 MED ORDER — LACTATED RINGERS IV SOLN
INTRAVENOUS | Status: DC
  Administered 2017-02-07: 11:00:00 via INTRAVENOUS

## 2017-02-07 MED ORDER — HYDROMORPHONE HCL-NACL 0.5-0.9 MG/ML-% IV SOSY: 0.2500 mg | PREFILLED_SYRINGE | INTRAVENOUS | Status: DC | PRN

## 2017-02-07 MED ORDER — HEPARIN SODIUM (PORCINE) 5000 UNIT/ML IJ SOLN
5000.0000 [IU] | Freq: Once | INTRAMUSCULAR | Status: AC
  Administered 2017-02-07: 5000 [IU] via SUBCUTANEOUS
  Filled 2017-02-07: qty 1

## 2017-02-07 MED ORDER — ENOXAPARIN SODIUM 40 MG/0.4ML ~~LOC~~ SOLN
40.0000 mg | SUBCUTANEOUS | Status: DC
  Administered 2017-02-07 – 2017-02-08 (×2): 40 mg via SUBCUTANEOUS
  Filled 2017-02-07 (×2): qty 0.4

## 2017-02-07 MED ORDER — BUPIVACAINE-EPINEPHRINE (PF) 0.25% -1:200000 IJ SOLN
INTRAMUSCULAR | Status: AC
  Filled 2017-02-07: qty 30

## 2017-02-07 MED ORDER — EPHEDRINE SULFATE-NACL 50-0.9 MG/10ML-% IV SOSY
PREFILLED_SYRINGE | INTRAVENOUS | Status: DC | PRN
  Administered 2017-02-07: 10 mg via INTRAVENOUS

## 2017-02-07 MED ORDER — DEXAMETHASONE SODIUM PHOSPHATE 10 MG/ML IJ SOLN
INTRAMUSCULAR | Status: DC | PRN
  Administered 2017-02-07: 10 mg via INTRAVENOUS

## 2017-02-07 MED ORDER — SCOPOLAMINE 1 MG/3DAYS TD PT72
MEDICATED_PATCH | TRANSDERMAL | Status: AC
  Filled 2017-02-07: qty 1

## 2017-02-07 MED ORDER — SUCCINYLCHOLINE CHLORIDE 200 MG/10ML IV SOSY
PREFILLED_SYRINGE | INTRAVENOUS | Status: DC | PRN
  Administered 2017-02-07: 100 mg via INTRAVENOUS

## 2017-02-07 MED ORDER — FENTANYL CITRATE (PF) 250 MCG/5ML IJ SOLN
INTRAMUSCULAR | Status: AC
  Filled 2017-02-07: qty 5

## 2017-02-07 MED ORDER — SUGAMMADEX SODIUM 200 MG/2ML IV SOLN
INTRAVENOUS | Status: DC | PRN
  Administered 2017-02-07: 200 mg via INTRAVENOUS

## 2017-02-07 MED ORDER — METHOCARBAMOL 1000 MG/10ML IJ SOLN
500.0000 mg | Freq: Three times a day (TID) | INTRAVENOUS | Status: DC | PRN
  Filled 2017-02-07: qty 5

## 2017-02-07 MED ORDER — KCL IN DEXTROSE-NACL 20-5-0.45 MEQ/L-%-% IV SOLN
INTRAVENOUS | Status: DC
  Administered 2017-02-07 – 2017-02-08 (×2): via INTRAVENOUS
  Filled 2017-02-07 (×3): qty 1000

## 2017-02-07 MED ORDER — FENTANYL CITRATE (PF) 100 MCG/2ML IJ SOLN
INTRAMUSCULAR | Status: DC | PRN
  Administered 2017-02-07 (×4): 50 ug via INTRAVENOUS

## 2017-02-07 MED ORDER — LACTATED RINGERS IR SOLN
Status: DC | PRN
  Administered 2017-02-07: 1000 mL

## 2017-02-07 MED ORDER — 0.9 % SODIUM CHLORIDE (POUR BTL) OPTIME
TOPICAL | Status: DC | PRN
  Administered 2017-02-07: 1000 mL

## 2017-02-07 MED ORDER — ACETAMINOPHEN 500 MG PO TABS
1000.0000 mg | ORAL_TABLET | ORAL | Status: AC
  Administered 2017-02-07: 1000 mg via ORAL
  Filled 2017-02-07: qty 2

## 2017-02-07 MED ORDER — SCOPOLAMINE 1 MG/3DAYS TD PT72
MEDICATED_PATCH | TRANSDERMAL | Status: DC | PRN
  Administered 2017-02-07: 1 via TRANSDERMAL

## 2017-02-07 MED ORDER — PROPOFOL 10 MG/ML IV BOLUS
INTRAVENOUS | Status: AC
  Filled 2017-02-07: qty 20

## 2017-02-07 MED ORDER — SUCCINYLCHOLINE CHLORIDE 200 MG/10ML IV SOSY
PREFILLED_SYRINGE | INTRAVENOUS | Status: AC
  Filled 2017-02-07: qty 10

## 2017-02-07 MED ORDER — PHENYLEPHRINE 40 MCG/ML (10ML) SYRINGE FOR IV PUSH (FOR BLOOD PRESSURE SUPPORT)
PREFILLED_SYRINGE | INTRAVENOUS | Status: AC
  Filled 2017-02-07: qty 10

## 2017-02-07 MED ORDER — ONDANSETRON HCL 4 MG/2ML IJ SOLN
INTRAMUSCULAR | Status: DC | PRN
  Administered 2017-02-07: 4 mg via INTRAVENOUS

## 2017-02-07 MED ORDER — LIDOCAINE 2% (20 MG/ML) 5 ML SYRINGE
INTRAMUSCULAR | Status: DC | PRN
  Administered 2017-02-07: 100 mg via INTRAVENOUS

## 2017-02-07 SURGICAL SUPPLY — 45 items
APPLIER CLIP ROT 10 11.4 M/L (STAPLE)
BANDAGE ADH SHEER 1  50/CT (GAUZE/BANDAGES/DRESSINGS) ×18 IMPLANT
BENZOIN TINCTURE PRP APPL 2/3 (GAUZE/BANDAGES/DRESSINGS) ×3 IMPLANT
CABLE HIGH FREQUENCY MONO STRZ (ELECTRODE) IMPLANT
CLIP APPLIE ROT 10 11.4 M/L (STAPLE) IMPLANT
DECANTER SPIKE VIAL GLASS SM (MISCELLANEOUS) ×3 IMPLANT
DEVICE SUT QUICK LOAD TK 5 (STAPLE) ×18 IMPLANT
DEVICE SUT TI-KNOT TK 5X26 (MISCELLANEOUS) IMPLANT
DEVICE SUTURE ENDOST 10MM (ENDOMECHANICALS) ×3 IMPLANT
DISSECTOR BLUNT TIP ENDO 5MM (MISCELLANEOUS) ×3 IMPLANT
DRAIN PENROSE 18X1/2 LTX STRL (DRAIN) ×3 IMPLANT
ELECT L-HOOK LAP 45CM DISP (ELECTROSURGICAL) ×3
ELECT PENCIL ROCKER SW 15FT (MISCELLANEOUS) IMPLANT
ELECT REM PT RETURN 15FT ADLT (MISCELLANEOUS) ×3 IMPLANT
ELECTRODE L-HOOK LAP 45CM DISP (ELECTROSURGICAL) ×2 IMPLANT
GLOVE BIO SURGEON STRL SZ7.5 (GLOVE) ×3 IMPLANT
GLOVE BIOGEL PI IND STRL 7.0 (GLOVE) IMPLANT
GLOVE BIOGEL PI INDICATOR 7.0 (GLOVE)
GLOVE INDICATOR 8.0 STRL GRN (GLOVE) ×3 IMPLANT
GOWN STRL REUS W/TWL LRG LVL3 (GOWN DISPOSABLE) ×3 IMPLANT
GOWN STRL REUS W/TWL XL LVL3 (GOWN DISPOSABLE) ×9 IMPLANT
KIT BASIN OR (CUSTOM PROCEDURE TRAY) ×3 IMPLANT
NS IRRIG 1000ML POUR BTL (IV SOLUTION) ×3 IMPLANT
SCISSORS LAP 5X45 EPIX DISP (ENDOMECHANICALS) ×3 IMPLANT
SET IRRIG TUBING LAPAROSCOPIC (IRRIGATION / IRRIGATOR) ×3 IMPLANT
SHEARS HARMONIC ACE PLUS 36CM (ENDOMECHANICALS) ×3 IMPLANT
SLEEVE ADV FIXATION 5X100MM (TROCAR) ×9 IMPLANT
STAPLER VISISTAT 35W (STAPLE) IMPLANT
STRIP CLOSURE SKIN 1/2X4 (GAUZE/BANDAGES/DRESSINGS) ×3 IMPLANT
SUT ETHIBOND 0 36 GRN (SUTURE) ×9 IMPLANT
SUT SURGIDAC NAB ES-9 0 48 120 (SUTURE) ×18 IMPLANT
SUT VIC AB 4-0 SH 18 (SUTURE) ×3 IMPLANT
TIP INNERVISION DETACH 40FR (MISCELLANEOUS) IMPLANT
TIP INNERVISION DETACH 50FR (MISCELLANEOUS) ×3 IMPLANT
TIP INNERVISION DETACH 56FR (MISCELLANEOUS) IMPLANT
TIPS INNERVISION DETACH 40FR (MISCELLANEOUS)
TOWEL OR NON WOVEN STRL DISP B (DISPOSABLE) IMPLANT
TRAY FOLEY W/METER SILVER 16FR (SET/KITS/TRAYS/PACK) ×3 IMPLANT
TRAY LAPAROSCOPIC (CUSTOM PROCEDURE TRAY) ×3 IMPLANT
TROCAR ADV FIXATION 11X100MM (TROCAR) IMPLANT
TROCAR ADV FIXATION 5X100MM (TROCAR) ×3 IMPLANT
TROCAR BLADELESS OPT 5 100 (ENDOMECHANICALS) ×3 IMPLANT
TROCAR XCEL BLUNT TIP 100MML (ENDOMECHANICALS) IMPLANT
TROCAR XCEL NON-BLD 11X100MML (ENDOMECHANICALS) IMPLANT
TUBING INSUF HEATED (TUBING) ×3 IMPLANT

## 2017-02-07 NOTE — Anesthesia Postprocedure Evaluation (Signed)
Anesthesia Post Note  Patient: Samantha Maynard  Procedure(s) Performed: Procedure(s) (LRB): LAPAROSCOPIC PARAESOPHAGEAL HERNIA REPAIR WITH NISSEN FUNDOPLICATON (N/A)     Patient location during evaluation: PACU Anesthesia Type: General Level of consciousness: awake Pain management: pain level controlled Vital Signs Assessment: post-procedure vital signs reviewed and stable Respiratory status: spontaneous breathing Cardiovascular status: stable Anesthetic complications: no    Last Vitals:  Vitals:   02/07/17 1157 02/07/17 1303  BP: (!) 151/85 (!) 155/90  Pulse: 93 84  Resp: 14 16  Temp: 36.7 C 36.6 C  SpO2: 99% 94%    Last Pain:  Vitals:   02/07/17 1130  TempSrc:   PainSc: Asleep                 Arif Amendola

## 2017-02-07 NOTE — Op Note (Signed)
Samantha Maynard, Samantha Maynard              ACCOUNT NO.:  [id number]  MEDICAL RECORD NO.:  [id number]  LOCATION:                                 FACILITY:  PHYSICIAN:  Leighton Ruff. Redmond Pulling, MD, FACSDATE OF BIRTH:  04/17/1961  DATE OF PROCEDURE:  02/07/2017 DATE OF DISCHARGE:                              OPERATIVE REPORT   PREOPERATIVE DIAGNOSES: 1. Paraesophageal hiatal hernia 2. Gastroesophageal reflux disease.  POSTOPERATIVE DIAGNOSES: 1. Paraesophageal hiatal hernia 2. Gastroesophageal reflux disease.  PROCEDURES:  Laparoscopic paraesophageal hiatal hernia repair with Nissen fundoplication.  SURGEON:  Leighton Ruff. Redmond Pulling, MD, FACS.  ASSISTANT SURGEON:  Isabel Caprice. Hassell Done, MD FACS  ANESTHESIA:  General.  EBL:  25 mL.  SPECIMEN:  Hernia sac, which was discarded.  INDICATIONS FOR PROCEDURE:  The patient is a pleasant female who has symptomatic paraesophageal hiatal hernia.  She was having early satiety as well as indigestion and worsening reflux tissues.  She would have shortness of breath with physical activity.  She would also have evidence of iron-deficiency anemia with Cameron's erosions seen on upper endoscopy.  Upper GI demonstrated a large paraesophageal hiatal hernia. She underwent upper endoscopy, which confirmed Cameron's erosions manometry.  There was no evidence of achalasia.  We had a prolonged discussion on 2 separate occasions regarding surgical management along with the potential risks and benefits of the procedure in detail.  She elected to proceed to surgery.  DESCRIPTION OF PROCEDURE:  The patient was on a chronic anti-platelet agent due to prior venous thromboembolism.  She stopped that medication several days prior to surgery.  She received 5000 units of subcutaneous heparin preoperatively along with ERAS medications.  She was then taken to the Operating Room at [facility], placed supine on the operating room table.  General endotracheal anesthesia was  established. A Foley catheter was placed.  Her arms were tucked at her sides with the appropriate padding.  She received IV antibiotics prior to skin incision.  A surgical time-out was performed.  I elected to gain access to her abdomen using Optiview technique.  A small left subcostal incision was made and using a 0 degree 5-mm laparoscope, I advanced it through all layers of the abdominal wall and through all layers in the abdominal cavity.  Pneumoperitoneum was smoothly established up to the patient pressure of 15 mmHg without any change in the patient's vital signs.  There was no evidence of injury to surrounding structures.  There were no adhesions to her abdominal wall. The patient was placed in reverse Trendelenburg.  A 5-mm trocar was placed several inches above and to the left of the umbilicus.  I then performed block using Exparel in the bilateral lateral upper abdominal walls.  A 5-mm trocar was placed in the lateral right abdominal wall, a 12 trocar in the right mid abdomen and then a final 5 trocar in the left lateral abdominal wall under direct visualization.  A Nathanson liver retractor was placed to lift up the left lobe of the liver.  The hiatus was visualized.  We were able to grab the stomach and reduce the majority of it.  I then incised the gastrohepatic ligament and took it down with  Harmonic scalpel until I came across the right crus of the diaphragm.  Then, we started reducing the hernia sac and working across anteriorly using both blunt dissection along with Harmonic scalpel.  The left crus of the diaphragm was visualized.  We ended up taking down short gastrics in order to get to the left crus of the diaphragm and incised the hernia sac on this side.  We continue to mobilize the hernia sac, taking care to be careful to the esophagus.  The esophagus was identified throughout the procedure.  At this point, we were able to further grab more of the thickened hernia  sac and reduced more of it from the mediastinum.  This was done in a combination of blunt dissection with a Kittner as well as with Harmonic scalpel.  I was able to find the junction of the left and right crura of the diaphragm.  We had placed a Penrose drain around it in order to facilitate traction of the stomach to further mobilize and take down the vascular attachments within the mediastinum.  The posterior vagus nerve was identified and preserved.  At this point, I felt like that I had achieved enough intraabdominal length of the esophagus.  We had Anesthesia passed a lighted 50-French bougie and watched to come down the esophagus into the stomach.  I then reapproximated the left and right crura of the diaphragm with interrupted 0 Ethibond sutures, secured with a titanium tie knot.  There were three sutures placed.  This left fairly snug closure around the esophagus.  At this point, using a grasper, I was able to pass it behind the esophagogastric junction and grab the fundus of the stomach and brought it retrogastric to the right side of the abdomen and continued to pull some of the more round.  It laid on the right side without any undue tension.  I then placed three interrupted 0 Ethibond sutures for my route.  Bite was taken along the greater curve along with some esophagus and then the fundus on the right side and then it was secured with a titanium tie knot.  This was done two additional times for wrap length of around 3 cm.  The dilator was then removed.  I then took down the [facility].  There was no evidence of injury to the liver.  Remaining Exparel was then infiltrated in the abdominal wall under direct visualization.  Trocars were removed.  Skin incisions were closed with a 4-0 Monocryl in a subcuticular fashion followed by the application of benzoin, Steri-Strips and bandages.  All needle and instrument counts were correct x2.  There were no  immediate complications.  The patient tolerated the procedure well.  Her Foley was removed, and she was taken to the recovery room in stable condition.     Leighton Ruff. Redmond Pulling, MD, FACS     EMW/MEDQ  D:  02/07/2017  T:  02/07/2017  Job:  [id number]

## 2017-02-07 NOTE — H&P (Signed)
Samantha Maynard is an 56 y.o. female.   Chief Complaint: here for surgery HPI: The patient is a 56 year old female who presents with a hiatal hernia for surgery.  She denies any major changes since she was last seen. However she now has to take Zofran as needed for nausea. She is now having eat smaller portions because of early satiety and the sensation of something getting stuck in her chest. She can no longer tolerate beef. She states that her reflux is not that bad. Since she was last seen she underwent an upper endoscopy in May which showed a large hiatal hernia with Lysbeth Galas erosions otherwise unremarkable. She underwent manometry study which showed elevated resting EG junction pressure with incomplete relaxation. Esophageal contractions were 100% peristaltic with normal distal latency an elevated distal contractile intercrural. Incomplete relaxation of the gastroesophageal junction with findings of gastroesophageal junction outflow obstruction likely secondary to large hiatal hernia. No evidence of achalasia.   07/25/2016 She is referred by Dr Lysle Rubens for evaluation of large paraesophageal hiatal hernia. She states that she has known that she has had a paraesophageal hernia for quite some time. When she was initially diagnosed with her pulmonary emboli she states that she was told she had a hiatal hernia on CT imaging but at that time was asymptomatic. Over the past few months she believes that she is gotten symptomatic. She started having some indigestion and heartburn problems that would occur soon after eating along with nausea. She gradually started eating slower and smaller meals which helped decrease her reflux issues. She states that if she eats too rapidly she will have a sensation that something get stuck or sits in her upper chest. She also states that if she leans forward to pet her dog she will have some shortness of breath. She also complains of shortness of breath with physical  activity. She has been sleeping upright for many months. She states she lays flat she will have an uncomfortable sensation in her chest and it will be a little bit difficult to breathe. She states that her primary care physician sent her for a nuclear stress test of heart because of the shortness of breath but it came back negative. I do not have a copy of this report. She states he then ordered an esophagram which demonstrated a fairly sizable paraesophageal hiatal hernia. The esophagram also failed to demonstrate normal primary peristaltic waves and there were occasional tertiary contractions noted. Evaluation of the distal stomach and antrum was limited.  She has a history of iron deficiency anemia that she sees hematology 4. She states that it is unclear why she has anemia. I performed a cholecystectomy on her a few years ago. She is on chronic anticoagulation because of a history of pulmonary emboli. Reportedly her hypercoagulable workup was negative  Review of systems-conference a 12 point review systems was performed and all systems are negative except for what is mentioned in HPI  Past Medical History:  Diagnosis Date  . ADD (attention deficit disorder)   . Anemia   . Anemia, iron deficiency 11/05/2013  . Anxiety   . Cholelithiasis    symptomatic  . Depression   . Diabetes mellitus without complication ([place])   . DVT (deep vein thrombosis) in pregnancy ([place])   . GERD (gastroesophageal reflux disease)   . Malabsorption of iron 11/05/2013  . Pulmonary emboli ([place])   . Wears glasses     Past Surgical History:  Procedure Laterality Date  . CHOLECYSTECTOMY  09/15/2014   laproscopic   . CHOLECYSTECTOMY N/A 09/15/2014   Procedure: LAPAROSCOPIC CHOLECYSTECTOMY WITH  INTRAOPERATIVE CHOLANGIOGRAM;  Surgeon: Greer Pickerel, MD;  Location: [facility];  Service: General;  Laterality: N/A;  . COLONOSCOPY    . ESOPHAGEAL MANOMETRY N/A 10/31/2016   Procedure: ESOPHAGEAL MANOMETRY (EM);  Surgeon:  Mauri Pole, MD;  Location: [facility];  Service: Endoscopy;  Laterality: N/A;  . ESOPHAGOGASTRODUODENOSCOPY    . MOUTH SURGERY    . TONSILLECTOMY      Family History  Problem Relation Age of Onset  . CAD Father   . Hypertension Other   . Diabetes Other   . Stroke Other   . Cancer Other   . Colon cancer Neg Hx   . Stomach cancer Neg Hx   . Pancreatic cancer Neg Hx    Social History:  reports that she has never smoked. She has never used smokeless tobacco. She reports that she drinks alcohol. She reports that she does not use drugs.  Allergies: No Known Allergies  Medications Prior to Admission  Medication Sig Dispense Refill  . clonazePAM (KLONOPIN) 1 MG tablet Take 1 mg by mouth at bedtime as needed (sleep).     . lisdexamfetamine (VYVANSE) 70 MG capsule Take 70 mg by mouth daily.    Marland Kitchen loratadine (CLARITIN) 10 MG tablet Take 10 mg by mouth daily.    . metFORMIN (GLUCOPHAGE) 500 MG tablet Take 500 mg by mouth 2 (two) times daily with a meal.     . Multiple Vitamin (MULTIVITAMIN WITH MINERALS) TABS tablet Take 1 tablet by mouth daily.    Marland Kitchen omeprazole (PRILOSEC) 20 MG capsule Take 1 capsule (20 mg total) by mouth daily. (Patient taking differently: Take 20 mg by mouth daily with supper. ) 30 capsule 11  . ondansetron (ZOFRAN) 4 MG tablet Take 4 mg by mouth every 6 (six) hours as needed for nausea or vomiting.     . rivaroxaban (XARELTO) 20 MG TABS tablet Take 20 mg by mouth daily with supper.    . venlafaxine XR (EFFEXOR-XR) 150 MG 24 hr capsule Take 150 mg by mouth daily at 6 PM.     . venlafaxine XR (EFFEXOR-XR) 75 MG 24 hr capsule Take 75 mg by mouth daily at 6 PM.    . acetaminophen (TYLENOL) 325 MG tablet Take 325-650 mg by mouth every 6 (six) hours as needed for mild pain or moderate pain.     Marland Kitchen senna (SENOKOT) 8.6 MG TABS tablet Take 1-2 tablets by mouth at bedtime as needed for mild constipation.       Results for orders placed or performed during the hospital  encounter of 02/07/17 (from the past 48 hour(s))  Glucose, capillary     Status: Abnormal   Collection Time: 02/07/17  5:58 AM  Result Value Ref Range   Glucose-Capillary 114 (H) 65 - 99 mg/dL   No results found.  Review of Systems  Constitutional: Negative for weight loss.  HENT: Negative for nosebleeds.   Eyes: Negative for blurred vision.  Respiratory: Negative for shortness of breath.   Cardiovascular: Negative for chest pain, palpitations, orthopnea and PND.       Denies DOE  Genitourinary: Negative for dysuria and hematuria.  Musculoskeletal: Negative.   Skin: Negative for itching and rash.  Neurological: Negative for dizziness, focal weakness, seizures, loss of consciousness and headaches.       Denies TIAs, amaurosis fugax  Endo/Heme/Allergies: Does not bruise/bleed easily.  Psychiatric/Behavioral: The patient is  not nervous/anxious.     Blood pressure (!) 152/89, pulse 96, temperature 99.1 F (37.3 C), temperature source Oral, resp. rate 18, height 5\' 3"  (1.6 m), weight 106.1 kg (234 lb), last menstrual period 06/04/2014, SpO2 96 %. Physical Exam  Vitals reviewed. Constitutional: She is oriented to person, place, and time. She appears well-developed and well-nourished. No distress.  HENT:  Head: Normocephalic and atraumatic.  Right Ear: External ear normal.  Left Ear: External ear normal.  Eyes: Conjunctivae are normal. No scleral icterus.  Neck: Normal range of motion. Neck supple. No tracheal deviation present. No thyromegaly present.  Cardiovascular: Normal rate and normal heart sounds.   Respiratory: Effort normal and breath sounds normal. No stridor. No respiratory distress. She has no wheezes.  GI: Soft. She exhibits no distension. There is no tenderness. There is no rebound.  Musculoskeletal: She exhibits no edema or tenderness.  Lymphadenopathy:    She has no cervical adenopathy.  Neurological: She is alert and oriented to person, place, and time. She  exhibits normal muscle tone.  Skin: Skin is warm and dry. No rash noted. She is not diaphoretic. No erythema. No pallor.  Psychiatric: She has a normal mood and affect. Her behavior is normal. Judgment and thought content normal.     Assessment/Plan Paraesophageal hiatal hernia H/o DVT/PE Depression Morbid obesity DM 2 H/o iron def anemia  Last dose of xarleto was on Sunday All questions asked and answered  Leighton Ruff. Redmond Pulling, MD, [place], Bariatric, & Minimally Invasive Surgery [facility], [place]   Gayland Curry, MD 02/07/2017, 7:23 AM

## 2017-02-07 NOTE — Brief Op Note (Signed)
02/07/2017  10:47 AM  PATIENT:  Samantha Maynard  56 y.o. female  PRE-OPERATIVE DIAGNOSIS:  paraesophageal hernia; gerd  POST-OPERATIVE DIAGNOSIS:  paraesophageal hernia; gerd  PROCEDURE:  Procedure(s): LAPAROSCOPIC PARAESOPHAGEAL HERNIA REPAIR WITH NISSEN FUNDOPLICATON (N/A)  SURGEON:  Surgeon(s) and Role:    Johnathan Hausen, MD - Assisting    * Greer Pickerel, MD - Primary  PHYSICIAN ASSISTANT:   ASSISTANTS: see above   ANESTHESIA:   general  EBL:  Total I/O In: 1000 [I.V.:1000] Out: 53 [Urine:60; Blood:25]  BLOOD ADMINISTERED:none  DRAINS: none   LOCAL MEDICATIONS USED:  OTHER exparel  SPECIMEN:  Source of Specimen:  hernia sac  DISPOSITION OF SPECIMEN:  discarded  COUNTS:  YES  TOURNIQUET:  * No tourniquets in log *  DICTATION: .Other Dictation: Dictation Number [id number]  PLAN OF CARE: Admit for overnight observation  PATIENT DISPOSITION:  PACU - hemodynamically stable.   Delay start of Pharmacological VTE agent (>24hrs) due to surgical blood loss or risk of bleeding: no  Leighton Ruff. Redmond Pulling, MD, FACS General, Bariatric, & Minimally Invasive Surgery [facility], [place]

## 2017-02-07 NOTE — Anesthesia Procedure Notes (Signed)
Procedure Name: Intubation Date/Time: 02/07/2017 7:40 AM Performed by: Carleene Cooper A Pre-anesthesia Checklist: Patient identified, Emergency Drugs available, Suction available, Patient being monitored and Timeout performed Patient Re-evaluated:Patient Re-evaluated prior to induction Oxygen Delivery Method: Circle system utilized Preoxygenation: Pre-oxygenation with 100% oxygen Induction Type: IV induction Ventilation: Mask ventilation without difficulty Laryngoscope Size: Mac and 4 Grade View: Grade II Tube type: Oral Tube size: 7.5 mm Number of attempts: 1 Airway Equipment and Method: Bougie stylet Placement Confirmation: ETT inserted through vocal cords under direct vision,  positive ETCO2 and breath sounds checked- equal and bilateral Secured at: 21 cm Tube secured with: Tape Dental Injury: Teeth and Oropharynx as per pre-operative assessment

## 2017-02-07 NOTE — Anesthesia Preprocedure Evaluation (Signed)
Anesthesia Evaluation  Patient identified by MRN, date of birth, ID band Patient awake    Reviewed: Allergy & Precautions, NPO status , Patient's Chart, lab work & pertinent test results  Airway Mallampati: II  TM Distance: >3 FB     Dental   Pulmonary    breath sounds clear to auscultation       Cardiovascular negative cardio ROS   Rhythm:Regular Rate:Normal     Neuro/Psych    GI/Hepatic Neg liver ROS, GERD  ,  Endo/Other  diabetes  Renal/GU      Musculoskeletal   Abdominal   Peds  Hematology  (+) anemia ,   Anesthesia Other Findings   Reproductive/Obstetrics                             Anesthesia Physical Anesthesia Plan  ASA: III  Anesthesia Plan: General   Post-op Pain Management:    Induction: Intravenous  PONV Risk Score and Plan: 3 and Ondansetron, Dexamethasone, Midazolam and Propofol infusion  Airway Management Planned: Oral ETT  Additional Equipment:   Intra-op Plan:   Post-operative Plan: Possible Post-op intubation/ventilation  Informed Consent: I have reviewed the patients History and Physical, chart, labs and discussed the procedure including the risks, benefits and alternatives for the proposed anesthesia with the patient or authorized representative who has indicated his/her understanding and acceptance.   Dental advisory given  Plan Discussed with: CRNA and Anesthesiologist  Anesthesia Plan Comments:         Anesthesia Quick Evaluation

## 2017-02-07 NOTE — Transfer of Care (Signed)
Immediate Anesthesia Transfer of Care Note  Patient: Samantha Maynard  Procedure(s) Performed: Procedure(s): LAPAROSCOPIC PARAESOPHAGEAL HERNIA REPAIR WITH NISSEN FUNDOPLICATON (N/A)  Patient Location: PACU  Anesthesia Type:General  Level of Consciousness: awake, alert , oriented and patient cooperative  Airway & Oxygen Therapy: Patient Spontanous Breathing and Patient connected to face mask oxygen  Post-op Assessment: Report given to RN, Post -op Vital signs reviewed and stable and Patient moving all extremities  Post vital signs: Reviewed and stable  Last Vitals:  Vitals:   02/07/17 0535  BP: (!) 152/89  Pulse: 96  Resp: 18  Temp: 37.3 C  SpO2: 96%    Last Pain:  Vitals:   02/07/17 0535  TempSrc: Oral      Patients Stated Pain Goal: 4 (92/42/68 3419)  Complications: No apparent anesthesia complications

## 2017-02-08 ENCOUNTER — Observation Stay (HOSPITAL_COMMUNITY): Payer: Federal, State, Local not specified - PPO

## 2017-02-08 DIAGNOSIS — Z833 Family history of diabetes mellitus: Secondary | ICD-10-CM | POA: Diagnosis not present

## 2017-02-08 DIAGNOSIS — Z8249 Family history of ischemic heart disease and other diseases of the circulatory system: Secondary | ICD-10-CM | POA: Diagnosis not present

## 2017-02-08 DIAGNOSIS — Z86711 Personal history of pulmonary embolism: Secondary | ICD-10-CM | POA: Diagnosis not present

## 2017-02-08 DIAGNOSIS — Z823 Family history of stroke: Secondary | ICD-10-CM | POA: Diagnosis not present

## 2017-02-08 DIAGNOSIS — F988 Other specified behavioral and emotional disorders with onset usually occurring in childhood and adolescence: Secondary | ICD-10-CM | POA: Diagnosis not present

## 2017-02-08 DIAGNOSIS — Z86718 Personal history of other venous thrombosis and embolism: Secondary | ICD-10-CM | POA: Diagnosis not present

## 2017-02-08 DIAGNOSIS — Z6841 Body Mass Index (BMI) 40.0 and over, adult: Secondary | ICD-10-CM | POA: Diagnosis not present

## 2017-02-08 DIAGNOSIS — Z9049 Acquired absence of other specified parts of digestive tract: Secondary | ICD-10-CM | POA: Diagnosis not present

## 2017-02-08 DIAGNOSIS — D509 Iron deficiency anemia, unspecified: Secondary | ICD-10-CM | POA: Diagnosis not present

## 2017-02-08 DIAGNOSIS — K449 Diaphragmatic hernia without obstruction or gangrene: Secondary | ICD-10-CM | POA: Diagnosis not present

## 2017-02-08 DIAGNOSIS — E119 Type 2 diabetes mellitus without complications: Secondary | ICD-10-CM | POA: Diagnosis not present

## 2017-02-08 DIAGNOSIS — K219 Gastro-esophageal reflux disease without esophagitis: Secondary | ICD-10-CM | POA: Diagnosis not present

## 2017-02-08 DIAGNOSIS — F329 Major depressive disorder, single episode, unspecified: Secondary | ICD-10-CM | POA: Diagnosis not present

## 2017-02-08 DIAGNOSIS — K909 Intestinal malabsorption, unspecified: Secondary | ICD-10-CM | POA: Diagnosis not present

## 2017-02-08 DIAGNOSIS — F419 Anxiety disorder, unspecified: Secondary | ICD-10-CM | POA: Diagnosis not present

## 2017-02-08 LAB — CBC
HCT: 31.6 % — ABNORMAL LOW (ref 36.0–46.0)
Hemoglobin: 9.6 g/dL — ABNORMAL LOW (ref 12.0–15.0)
MCH: 23.8 pg — ABNORMAL LOW (ref 26.0–34.0)
MCHC: 30.4 g/dL (ref 30.0–36.0)
MCV: 78.2 fL (ref 78.0–100.0)
Platelets: 356 10*3/uL (ref 150–400)
RBC: 4.04 MIL/uL (ref 3.87–5.11)
RDW: 16.3 % — ABNORMAL HIGH (ref 11.5–15.5)
WBC: 11.7 10*3/uL — ABNORMAL HIGH (ref 4.0–10.5)

## 2017-02-08 LAB — BASIC METABOLIC PANEL
Anion gap: 10 (ref 5–15)
BUN: 8 mg/dL (ref 6–20)
CO2: 27 mmol/L (ref 22–32)
Calcium: 9 mg/dL (ref 8.9–10.3)
Chloride: 102 mmol/L (ref 101–111)
Creatinine, Ser: 0.83 mg/dL (ref 0.44–1.00)
GFR calc Af Amer: >60 mL/min (ref >60)
GFR calc non Af Amer: >60 mL/min (ref >60)
Glucose, Bld: 190 mg/dL — ABNORMAL HIGH (ref 65–99)
Potassium: 4.3 mmol/L (ref 3.5–5.1)
Sodium: 139 mmol/L (ref 135–145)

## 2017-02-08 MED ORDER — ACETAMINOPHEN 325 MG PO TABS: 650.0000 mg | ORAL_TABLET | Freq: Four times a day (QID) | ORAL | Status: DC | PRN

## 2017-02-08 MED ORDER — IOPAMIDOL (ISOVUE-300) INJECTION 61%
INTRAVENOUS | Status: AC
  Administered 2017-02-08: 150 mL
  Filled 2017-02-08: qty 150

## 2017-02-08 MED ORDER — OXYCODONE HCL 5 MG/5ML PO SOLN
5.0000 mg | ORAL | Status: DC | PRN
  Administered 2017-02-08 – 2017-02-09 (×3): 5 mg via ORAL
  Filled 2017-02-08 (×3): qty 5

## 2017-02-08 NOTE — Progress Notes (Signed)
1 Day Post-Op   Subjective/Chief Complaint: Doing well. Min nausea. Min pain. Slept well. No vomiting. Walked several times   Objective: Vital signs in last 24 hours: Temp:  [97.8 F (36.6 C)-98.6 F (37 C)] 98.6 F (37 C) (09/21 1402) Pulse Rate:  [88-92] 88 (09/21 1402) Resp:  [16] 16 (09/21 1402) BP: (125-154)/(84-93) 126/90 (09/21 1402) SpO2:  [95 %-97 %] 95 % (09/21 1402)    Intake/Output from previous day: 09/20 0701 - 09/21 0700 In: 3282.5 [I.V.:2982.5; IV Piggyback:300] Out: 1610 [Urine:5285; Blood:25] Intake/Output this shift: Total I/O In: 480 [P.O.:480] Out: 2200 [Urine:2200]  Resting comfortably, nad cta b/l Reg Soft, min TTP, incisions c/d/i, nd No edema, +SCDs  Lab Results:   Recent Labs  02/08/17 0501  WBC 11.7*  HGB 9.6*  HCT 31.6*  PLT 356   BMET  Recent Labs  02/08/17 0501  NA 139  K 4.3  CL 102  CO2 27  GLUCOSE 190*  BUN 8  CREATININE 0.83  CALCIUM 9.0   PT/INR No results for input(s): LABPROT, INR in the last 72 hours. ABG No results for input(s): PHART, HCO3 in the last 72 hours.  Invalid input(s): PCO2, PO2  Studies/Results: Dg Ugi W/water Sol Cm  Result Date: 02/08/2017 CLINICAL DATA:  Postop day 1 status post Nissen fundoplication for paraesophageal hiatal hernia. EXAM: WATER SOLUBLE UPPER GI SERIES TECHNIQUE: Single-column upper GI series was performed using water soluble contrast. CONTRAST:  129mL ISOVUE-300 IOPAMIDOL (ISOVUE-300) INJECTION 61% COMPARISON:  Upper GI dated July 04, 2016. FLUOROSCOPY TIME:  Fluoroscopy Time:  0.8 minutes. Radiation Exposure Index (if provided by the fluoroscopic device): 24.9 mGy. Number of Acquired Spot Images: 1 FINDINGS: Preliminary KUB demonstrates a nonobstructive bowel gas pattern. Surgical clips are noted near the gastroesophageal junction. Prior cholecystectomy. Postsurgical changes related to Nissen fundoplication. No obstruction to the forward flow of contrast throughout the  esophagus and into the stomach. No residual hiatal hernia. No evidence of contrast leak. No gastroesophageal reflux occurred spontaneously. IMPRESSION: 1. No residual hiatal hernia. 2. No evidence of contrast leak. Electronically Signed   By: Samantha Maynard M.D.   On: 02/08/2017 10:11    Anti-infectives: Anti-infectives    Start     Dose/Rate Route Frequency Ordered Stop   02/07/17 0625  cefoTEtan in Dextrose 5% (CEFOTAN) 2-2.08 GM-% IVPB    Comments:  Samantha Maynard   : cabinet override      02/07/17 0625 02/07/17 0741   02/07/17 0533  cefoTEtan in Dextrose 5% (CEFOTAN) IVPB 2 g     2 g Intravenous On call to O.R. 02/07/17 0533 02/07/17 [id number]      Assessment/Plan: H/o PE Paraesophageal hiatal hernia  s/p Procedure(s): LAPAROSCOPIC PARAESOPHAGEAL HERNIA REPAIR WITH NISSEN FUNDOPLICATON (N/A)  Doing well. Min narcotic use so far No fever, tachycardia, wbc.  Plan UGI this am to evaluate new anatomy - if ok start clears Cont chemical VTE prophylaxis  prob dc Saturday Resume eliquis on discharge  Samantha Ruff. Redmond Pulling, MD, FACS General, Bariatric, & Minimally Invasive Surgery [facility], [place]   LOS: 0 days    Gayland Curry 02/08/2017

## 2017-02-08 NOTE — Discharge Instructions (Signed)
EATING AFTER YOUR ESOPHAGEAL SURGERY (Stomach Fundoplication, Hiatal Hernia repair, Achalasia surgery, etc)  ######################################################################  EAT Start with a pureed / full liquid diet (see below) Gradually transition to a high fiber diet with a fiber supplement over the next month after discharge.    WALK Walk an hour a day.  Control your pain to do that.    CONTROL PAIN Control pain so that you can walk, sleep, tolerate sneezing/coughing, go up/down stairs.  HAVE A BOWEL MOVEMENT DAILY Keep your bowels regular to avoid problems.  OK to try a laxative to override constipation.  OK to use an antidairrheal to slow down diarrhea.  Call if not better after 2 tries  CALL IF YOU HAVE PROBLEMS/CONCERNS Call if you are still struggling despite following these instructions. Call if you have concerns not answered by these instructions  ######################################################################   After your esophageal surgery, expect some sticking with swallowing over the next 1-2 months.    If food sticks when you eat, it is called "dysphagia".  This is due to swelling around your esophagus at the wrap & hiatal diaphragm repair.  It will gradually ease off over the next few months.  To help you through this temporary phase, we start you out on a pureed (blenderized) diet.  Your first meal in the hospital was thin liquids.  You should have been given a pureed diet by the time you left the hospital.  We ask patients to stay on a pureed diet for the first 2-3 weeks to avoid anything getting "stuck" near your recent surgery.  Don't be alarmed if your ability to swallow doesn't progress according to this plan.  Everyone is different and some diets can advance more or less quickly.     Some BASIC RULES to follow are:  Maintain an upright position whenever eating or drinking.  Take small bites - just a teaspoon size bite at a time.  Eat slowly.   It may also help to eat only one food at a time.  Consider nibbling through smaller, more frequent meals & avoid the urge to eat BIG meals  Do not push through feelings of fullness, nausea, or bloatedness  Do not mix solid foods and liquids in the same mouthful  Try not to "wash foods down" with large gulps of liquids.  Avoid carbonated (bubbly/fizzy) drinks.    Avoid foods that make you feel gassy or bloated.  Start with bland foods first.  Wait on trying greasy, fried, or spicy meals until you are tolerating more bland solids well.  Understand that it will be hard to burp and belch at first.  This gradually improves with time.  Expect to be more gassy/flatulent/bloated initially.  Walking will help your body manage it better.  Consider using medications for bloating that contain simethicone such as  Maalox or Gas-X   Eat in a relaxed atmosphere & minimize distractions.  Avoid talking while eating.    Do not use straws.  Following each meal, sit in an upright position (90 degree angle) for 60 to 90 minutes.  Going for a short walk can help as well  If food does stick, don't panic.  Try to relax and let the food pass on its own.  Sipping WARM LIQUID such as strong hot black tea can also help slide it down.   Be gradual in changes & use common sense:  -If you easily tolerating a certain "level" of foods, advance to the next level gradually -If you are  having trouble swallowing a particular food, then avoid it.   -If food is sticking when you advance your diet, go back to thinner previous diet (the lower LEVEL) for 1-2 days.  LEVEL 1 = PUREED DIET  Do for the first 2 WEEKS AFTER SURGERY  -Foods in this group are pureed or blenderized to a smooth, mashed potato-like consistency.  -If necessary, the pureed foods can keep their shape with the addition of a thickening agent.   -Meat should be pureed to a smooth, pasty consistency.  Hot broth or gravy may be added to the pureed  meat, approximately 1 oz. of liquid per 3 oz. serving of meat. -CAUTION:  If any foods do not puree into a smooth consistency, swallowing will be more difficult.  (For example, nuts or seeds sometimes do not blend well.)  Hot Foods Cold Foods  Pureed scrambled eggs and cheese Pureed cottage cheese  Baby cereals Thickened juices and nectars  Thinned cooked cereals (no lumps) Thickened milk or eggnog  Pureed [place] toast or pancakes Ensure  Mashed potatoes Ice cream  Pureed parsley, au gratin, scalloped potatoes, candied sweet potatoes Fruit or [place] ice, sherbet  Pureed buttered or alfredo noodles Plain yogurt  Pureed vegetables (no corn or peas) Instant breakfast  Pureed soups and creamed soups Smooth pudding, mousse, custard  Pureed scalloped apples Whipped gelatin  Gravies Sugar, syrup, honey, jelly  Sauces, cheese, tomato, barbecue, white, creamed Cream  Any baby food Creamer  Alcohol in moderation (not beer or champagne) Margarine  Coffee or tea Mayonnaise   Ketchup, mustard   Apple sauce   SAMPLE MENU:  PUREED DIET Breakfast Lunch Dinner   Orange juice, 1/2 cup  Cream of wheat, 1/2 cup  Pineapple juice, 1/2 cup  Pureed [place], barley soup, 3/4 cup  Pureed Hawaiian chicken, 3 oz   Scrambled eggs, mashed or blended with cheese, 1/2 cup  Tea or coffee, 1 cup   Whole milk, 1 cup   Non-dairy creamer, 2 Tbsp.  Mashed potatoes, 1/2 cup  Pureed cooled broccoli, 1/2 cup  Apple sauce, 1/2 cup  Coffee or tea  Mashed potatoes, 1/2 cup  Pureed spinach, 1/2 cup  Frozen yogurt, 1/2 cup  Tea or coffee      LEVEL 2 = SOFT DIET  After your first 2 weeks, you can advance to a soft diet.   Keep on this diet until everything goes down easily.  Hot Foods Cold Foods  White fish Cottage cheese  Stuffed fish Junior baby fruit  Baby food meals Semi thickened juices  Minced soft cooked, scrambled, poached eggs nectars  Souffle & omelets Ripe mashed bananas  Cooked  cereals Canned fruit, pineapple sauce, milk  potatoes Milkshake  Buttered or Alfredo noodles Custard  Cooked cooled vegetable Puddings, including tapioca  Sherbet Yogurt  Vegetable soup or alphabet soup Fruit ice, [place] ice  Gravies Whipped gelatin  Sugar, syrup, honey, jelly Junior baby desserts  Sauces:  Cheese, creamed, barbecue, tomato, white Cream  Coffee or tea Margarine   SAMPLE MENU:  LEVEL 2 Breakfast Lunch Dinner   Orange juice, 1/2 cup  Oatmeal, 1/2 cup  Scrambled eggs with cheese, 1/2 cup  Decaffeinated tea, 1 cup  Whole milk, 1 cup  Non-dairy creamer, 2 Tbsp  Pineapple juice, 1/2 cup  Minced beef, 3 oz  Gravy, 2 Tbsp  Mashed potatoes, 1/2 cup  Minced fresh broccoli, 1/2 cup  Applesauce, 1/2 cup  Coffee, 1 cup  [place], barley soup, 3/4 cup  Minced Hawaiian chicken, 3 oz  Mashed potatoes, 1/2 cup  Cooked spinach, 1/2 cup  Frozen yogurt, 1/2 cup  Non-dairy creamer, 2 Tbsp      LEVEL 3 = CHOPPED DIET  -After all the foods in level 2 (soft diet) are passing through well you should advance up to more chopped foods.  -It is still important to cut these foods into small pieces and eat slowly.  Hot Foods Cold Foods  Poultry Cottage cheese  Chopped Swedish meatballs Yogurt  Meat salads (ground or flaked meat) Milk  Flaked fish (tuna) Milkshakes  Poached or scrambled eggs Soft, cold, dry cereal  Souffles and omelets Fruit juices or nectars  Cooked cereals Chopped canned fruit  Chopped [place] toast or pancakes Canned fruit cocktail  Noodles or pasta (no rice) Pudding, mousse, custard  Cooked vegetables (no frozen peas, corn, or mixed vegetables) Green salad  Canned small sweet peas Ice cream  Creamed soup or vegetable soup Fruit ice, [place] ice  Pureed vegetable soup or alphabet soup Non-dairy creamer  Ground scalloped apples Margarine  Gravies Mayonnaise  Sauces:  Cheese, creamed, barbecue, tomato, white Ketchup  Coffee or tea Mustard    SAMPLE MENU:  LEVEL 3 Breakfast Lunch Dinner   Orange juice, 1/2 cup  Oatmeal, 1/2 cup  Scrambled eggs with cheese, 1/2 cup  Decaffeinated tea, 1 cup  Whole milk, 1 cup  Non-dairy creamer, 2 Tbsp  Ketchup, 1 Tbsp  Margarine, 1 tsp  Salt, 1/4 tsp  Sugar, 2 tsp  Pineapple juice, 1/2 cup  Ground beef, 3 oz  Gravy, 2 Tbsp  Mashed potatoes, 1/2 cup  Cooked spinach, 1/2 cup  Applesauce, 1/2 cup  Decaffeinated coffee  Whole milk  Non-dairy creamer, 2 Tbsp  Margarine, 1 tsp  Salt, 1/4 tsp  Pureed [place], barley soup, 3/4 cup  Barbecue chicken, 3 oz  Mashed potatoes, 1/2 cup  Ground fresh broccoli, 1/2 cup  Frozen yogurt, 1/2 cup  Decaffeinated tea, 1 cup  Non-dairy creamer, 2 Tbsp  Margarine, 1 tsp  Salt, 1/4 tsp  Sugar, 1 tsp    LEVEL 4:  REGULAR FOODS  -Foods in this group are soft, moist, regularly textured foods.   -This level includes meat and breads, which tend to be the hardest things to swallow.   -Eat very slowly, chew well and continue to avoid carbonated drinks. -most people are at this level in 4-6 weeks  Hot Foods Cold Foods  Baked fish or skinned Soft cheeses - cottage cheese  Souffles and omelets Cream cheese  Eggs Yogurt  Stuffed shells Milk  Spaghetti with meat sauce Milkshakes  Cooked cereal Cold dry cereals (no nuts, dried fruit, coconut)  [place] toast or pancakes Crackers  Buttered toast Fruit juices or nectars  Noodles or pasta (no rice) Canned fruit  Potatoes (all types) Ripe bananas  Soft, cooked vegetables (no corn, lima, or baked beans) Peeled, ripe, fresh fruit  Creamed soups or vegetable soup Cakes (no nuts, dried fruit, coconut)  Canned chicken noodle soup Plain doughnuts  Gravies Ice cream  Bacon dressing Pudding, mousse, custard  Sauces:  Cheese, creamed, barbecue, tomato, white Fruit ice, [place] ice, sherbet  Decaffeinated tea or coffee Whipped gelatin  Pork chops Regular gelatin   Canned fruited  gelatin molds   Sugar, syrup, honey, jam, jelly   Cream   Non-dairy   Margarine   Oil   Mayonnaise   Ketchup   Mustard   TROUBLESHOOTING IRREGULAR BOWELS  1) Avoid extremes of bowel  movements (no bad constipation/diarrhea)  2) Miralax 17gm mixed in [place]. water or juice-daily. May use BID as needed.  3) Gas-x,Phazyme, etc. as needed for gas & bloating.  4) Soft,bland diet. No spicy,greasy,fried foods.  5) Prilosec over-the-counter as needed  6) May hold gluten/wheat products from diet to see if symptoms improve.  7) May try probiotics (Align, Activa, etc) to help calm the bowels down  7) If symptoms become worse call back immediately.    If you have any questions please call our office at [place]: [phone].   [facility] LAPAROSCOPIC SURGERY: POST OP INSTRUCTIONS Always review your discharge instruction sheet given to you by the facility where your surgery was performed. IF YOU HAVE DISABILITY OR FAMILY LEAVE FORMS, YOU MUST BRING THEM TO THE OFFICE FOR PROCESSING.   DO NOT GIVE THEM TO YOUR DOCTOR.  1. A prescription for pain medication may be given to you upon discharge.  Take your pain medication as prescribed, if needed.  If narcotic pain medicine is not needed, then you may take acetaminophen (Tylenol) as needed. 2. Take your usually prescribed medications unless otherwise directed. 3. If you need a refill on your pain medication, please contact your pharmacy.  They will contact our office to request authorization. Prescriptions will not be filled after 5pm or on week-ends. 4.  Be sure to include lots of fluids daily. 5. Most patients will experience some swelling and bruising in the area of the incisions.  Ice packs will help.  Swelling and bruising can take several days to resolve.  6. It is common to experience some constipation if taking pain medication after surgery.  Increasing fluid intake and taking a stool softener (such as  Colace) will usually help or prevent this problem from occurring.  A mild laxative (Milk of Magnesia or Miralax) should be taken according to package instructions if there are no bowel movements after 48 hours. 7. Unless discharge instructions indicate otherwise, you may remove your bandages 48 hours after surgery, and you may shower at that time.  You  have steri-strips (small skin tapes) in place directly over the incision.  These strips should be left on the skin for 7-10 days.  If your surgeon used skin glue on the incision, you may shower in 24 hours.  The glue will flake off over the next 2-3 weeks.  Any sutures or staples will be removed at the office during your follow-up visit. 8. ACTIVITIES:  You may resume regular (light) daily activities beginning the next day--such as daily self-care, walking, climbing stairs--gradually increasing activities as tolerated.  You may have sexual intercourse when it is comfortable.  Refrain from any heavy lifting or straining until approved by your doctor. a. You may drive when you are no longer taking prescription pain medication, you can comfortably wear a seatbelt, and you can safely maneuver your car and apply brakes. 9. You should see your doctor in the office for a follow-up appointment approximately 2-3 weeks after your surgery.  Make sure that you call for this appointment within a day or two after you arrive home to insure a convenient appointment time. 10. OTHER INSTRUCTIONS: WHEN TO CALL YOUR DOCTOR: 1. Fever over 101.0 2. Inability to urinate 3. Continued bleeding from incision. 4. Increased pain, redness, or drainage from the incision. 5. Increasing abdominal pain  The clinic staff is available to answer your questions during regular business hours.  Please dont hesitate to call and ask to speak  to one of the nurses for clinical concerns.  If you have a medical emergency, go to the nearest emergency room or call 911.  A surgeon from [facility] is always on call at the hospital. [address], [employer], [place], [place]  [address] ? [address], [place], [place]   [address] [phone] ? [phone] ? FAX [phone] Web site: [email or web address]

## 2017-02-09 DIAGNOSIS — K909 Intestinal malabsorption, unspecified: Secondary | ICD-10-CM | POA: Diagnosis not present

## 2017-02-09 DIAGNOSIS — Z833 Family history of diabetes mellitus: Secondary | ICD-10-CM | POA: Diagnosis not present

## 2017-02-09 DIAGNOSIS — F419 Anxiety disorder, unspecified: Secondary | ICD-10-CM | POA: Diagnosis not present

## 2017-02-09 DIAGNOSIS — K449 Diaphragmatic hernia without obstruction or gangrene: Secondary | ICD-10-CM | POA: Diagnosis not present

## 2017-02-09 DIAGNOSIS — Z8249 Family history of ischemic heart disease and other diseases of the circulatory system: Secondary | ICD-10-CM | POA: Diagnosis not present

## 2017-02-09 DIAGNOSIS — D509 Iron deficiency anemia, unspecified: Secondary | ICD-10-CM | POA: Diagnosis not present

## 2017-02-09 DIAGNOSIS — Z823 Family history of stroke: Secondary | ICD-10-CM | POA: Diagnosis not present

## 2017-02-09 DIAGNOSIS — Z6841 Body Mass Index (BMI) 40.0 and over, adult: Secondary | ICD-10-CM | POA: Diagnosis not present

## 2017-02-09 DIAGNOSIS — Z9049 Acquired absence of other specified parts of digestive tract: Secondary | ICD-10-CM | POA: Diagnosis not present

## 2017-02-09 DIAGNOSIS — F988 Other specified behavioral and emotional disorders with onset usually occurring in childhood and adolescence: Secondary | ICD-10-CM | POA: Diagnosis not present

## 2017-02-09 DIAGNOSIS — Z86718 Personal history of other venous thrombosis and embolism: Secondary | ICD-10-CM | POA: Diagnosis not present

## 2017-02-09 DIAGNOSIS — Z86711 Personal history of pulmonary embolism: Secondary | ICD-10-CM | POA: Diagnosis not present

## 2017-02-09 DIAGNOSIS — E119 Type 2 diabetes mellitus without complications: Secondary | ICD-10-CM | POA: Diagnosis not present

## 2017-02-09 DIAGNOSIS — K219 Gastro-esophageal reflux disease without esophagitis: Secondary | ICD-10-CM | POA: Diagnosis not present

## 2017-02-09 DIAGNOSIS — F329 Major depressive disorder, single episode, unspecified: Secondary | ICD-10-CM | POA: Diagnosis not present

## 2017-02-09 LAB — CBC
HCT: 30.8 % — ABNORMAL LOW (ref 36.0–46.0)
Hemoglobin: 9.5 g/dL — ABNORMAL LOW (ref 12.0–15.0)
MCH: 24.5 pg — ABNORMAL LOW (ref 26.0–34.0)
MCHC: 30.8 g/dL (ref 30.0–36.0)
MCV: 79.4 fL (ref 78.0–100.0)
Platelets: 320 10*3/uL (ref 150–400)
RBC: 3.88 MIL/uL (ref 3.87–5.11)
RDW: 16.7 % — ABNORMAL HIGH (ref 11.5–15.5)
WBC: 9.3 10*3/uL (ref 4.0–10.5)

## 2017-02-09 MED ORDER — OXYCODONE HCL 5 MG/5ML PO SOLN: 5.0000 mg | ORAL | 0 refills | Status: DC | PRN

## 2017-02-09 NOTE — Progress Notes (Signed)
Assessment Active Problems:   Paraesophageal hiatal hernia s/p laparoscopic repair with Nissen fundoplication-progressing well   Plan:  Discharge.  Instructions discussed with her.   LOS: 0 days     2 Days Post-Op  Chief Complaint/Subjective: No complaints this AM.  Swallowing well.  Objective: Vital signs in last 24 hours: Temp:  [97.6 F (36.4 C)-98.6 F (37 C)] 97.9 F (36.6 C) (09/22 0430) Pulse Rate:  [74-88] 76 (09/22 0430) Resp:  [16-18] 18 (09/22 0430) BP: (115-126)/(68-90) 115/68 (09/22 0430) SpO2:  [95 %-98 %] 98 % (09/22 0430) Last BM Date: 02/07/17  Intake/Output from previous day: 09/21 0701 - 09/22 0700 In: 960 [P.O.:960] Out: 3450 [Urine:3450] Intake/Output this shift: Total I/O In: -  Out: 650 [Urine:650]  PE: General- In NAD.  Awake and alert. Abdomen-soft, dressings dry  Lab Results:   Recent Labs  02/08/17 0501 02/09/17 0429  WBC 11.7* 9.3  HGB 9.6* 9.5*  HCT 31.6* 30.8*  PLT 356 320   BMET  Recent Labs  02/08/17 0501  NA 139  K 4.3  CL 102  CO2 27  GLUCOSE 190*  BUN 8  CREATININE 0.83  CALCIUM 9.0   PT/INR No results for input(s): LABPROT, INR in the last 72 hours. Comprehensive Metabolic Panel:    Component Value Date/Time   NA 139 02/08/2017 0501   NA 140 01/23/2017 1334   NA 142 09/20/2016 1131   NA 139 06/13/2016 1416   NA 139 12/02/2015 1104   NA 141 09/02/2015 1046   K 4.3 02/08/2017 0501   K 4.3 01/23/2017 1334   K 3.9 09/20/2016 1131   K 4.0 06/13/2016 1416   K 4.1 12/02/2015 1104   K 4.0 09/02/2015 1046   CL 102 02/08/2017 0501   CL 98 01/23/2017 1334   CL 102 09/20/2016 1131   CL 101 06/13/2016 1416   CO2 27 02/08/2017 0501   CO2 27 01/23/2017 1334   CO2 27 09/20/2016 1131   CO2 27 06/13/2016 1416   CO2 25 12/02/2015 1104   CO2 27 09/02/2015 1046   BUN 8 02/08/2017 0501   BUN 11 01/23/2017 1334   BUN 10 09/20/2016 1131   BUN 11 06/13/2016 1416   BUN 17.5 12/02/2015 1104   BUN 13.3 09/02/2015  1046   CREATININE 0.83 02/08/2017 0501   CREATININE 0.93 01/23/2017 1334   CREATININE 0.8 09/20/2016 1131   CREATININE 0.8 06/13/2016 1416   CREATININE 0.9 12/02/2015 1104   CREATININE 0.9 09/02/2015 1046   GLUCOSE 190 (H) 02/08/2017 0501   GLUCOSE 133 (H) 01/23/2017 1334   GLUCOSE 120 (H) 09/20/2016 1131   GLUCOSE 141 (H) 06/13/2016 1416   CALCIUM 9.0 02/08/2017 0501   CALCIUM 9.8 01/23/2017 1334   CALCIUM 9.2 09/20/2016 1131   CALCIUM 8.8 06/13/2016 1416   CALCIUM 9.3 12/02/2015 1104   CALCIUM 10.0 09/02/2015 1046   AST 15 01/23/2017 1334   AST 21 09/20/2016 1131   AST 17 06/13/2016 1416   AST 19 12/02/2015 1104   AST 27 09/02/2015 1046   ALT 16 01/23/2017 1334   ALT 24 09/20/2016 1131   ALT 19 06/13/2016 1416   ALT 23 12/02/2015 1104   ALT 40 09/02/2015 1046   ALKPHOS 134 (H) 01/23/2017 1334   ALKPHOS 151 (H) 09/20/2016 1131   ALKPHOS 123 (H) 06/13/2016 1416   ALKPHOS 135 12/02/2015 1104   ALKPHOS 131 09/02/2015 1046   BILITOT <0.2 01/23/2017 1334   BILITOT 0.50 09/20/2016 1131  BILITOT 0.50 06/13/2016 1416   BILITOT <0.30 12/02/2015 1104   BILITOT <0.30 09/02/2015 1046   PROT 7.7 01/23/2017 1334   PROT 7.4 09/20/2016 1131   PROT 7.0 06/13/2016 1416   PROT 7.5 12/02/2015 1104   PROT 8.3 09/02/2015 1046   ALBUMIN 4.3 01/23/2017 1334   ALBUMIN 3.7 09/20/2016 1131   ALBUMIN 3.4 06/13/2016 1416   ALBUMIN 3.7 12/02/2015 1104   ALBUMIN 4.2 09/02/2015 1046     Studies/Results: Dg Ugi W/water Sol Cm  Result Date: 02/08/2017 CLINICAL DATA:  Postop day 1 status post Nissen fundoplication for paraesophageal hiatal hernia. EXAM: WATER SOLUBLE UPPER GI SERIES TECHNIQUE: Single-column upper GI series was performed using water soluble contrast. CONTRAST:  139mL ISOVUE-300 IOPAMIDOL (ISOVUE-300) INJECTION 61% COMPARISON:  Upper GI dated July 04, 2016. FLUOROSCOPY TIME:  Fluoroscopy Time:  0.8 minutes. Radiation Exposure Index (if provided by the fluoroscopic device): 24.9  mGy. Number of Acquired Spot Images: 1 FINDINGS: Preliminary KUB demonstrates a nonobstructive bowel gas pattern. Surgical clips are noted near the gastroesophageal junction. Prior cholecystectomy. Postsurgical changes related to Nissen fundoplication. No obstruction to the forward flow of contrast throughout the esophagus and into the stomach. No residual hiatal hernia. No evidence of contrast leak. No gastroesophageal reflux occurred spontaneously. IMPRESSION: 1. No residual hiatal hernia. 2. No evidence of contrast leak. Electronically Signed   By: Titus Dubin M.D.   On: 02/08/2017 10:11    Anti-infectives: Anti-infectives    Start     Dose/Rate Route Frequency Ordered Stop   02/07/17 0625  cefoTEtan in Dextrose 5% (CEFOTAN) 2-2.08 GM-% IVPB    Comments:  Carleene Cooper   : cabinet override      02/07/17 0625 02/07/17 0741   02/07/17 0533  cefoTEtan in Dextrose 5% (CEFOTAN) IVPB 2 g     2 g Intravenous On call to O.R. 02/07/17 0533 02/07/17 [id number]       Josilynn Losh J 02/09/2017

## 2017-02-09 NOTE — Progress Notes (Signed)
Discussed discharge instructions with patient, she verbalized agreement and understanding.  Patient to go with all belongings to go home  In private vehicle.

## 2017-02-14 ENCOUNTER — Ambulatory Visit (HOSPITAL_BASED_OUTPATIENT_CLINIC_OR_DEPARTMENT_OTHER): Payer: Federal, State, Local not specified - PPO

## 2017-02-14 VITALS — BP 117/75 | HR 86 | Temp 98.1°F | Resp 18

## 2017-02-14 DIAGNOSIS — D509 Iron deficiency anemia, unspecified: Secondary | ICD-10-CM

## 2017-02-14 DIAGNOSIS — K909 Intestinal malabsorption, unspecified: Secondary | ICD-10-CM

## 2017-02-14 MED ORDER — SODIUM CHLORIDE 0.9 % IV SOLN
510.0000 mg | Freq: Once | INTRAVENOUS | Status: AC
  Administered 2017-02-14: 510 mg via INTRAVENOUS
  Filled 2017-02-14: qty 17

## 2017-02-14 NOTE — Patient Instructions (Signed)

## 2017-02-21 NOTE — Discharge Summary (Signed)
Physician Discharge Summary  Patient ID: Samantha Maynard MRN: [id number] DOB/AGE: [id number] 55 y.o.  Admit date: 02/07/2017 Discharge date: 02/21/2017  Admission Diagnoses:  Paraesophageal hiatal hernia and GERD  Discharge Diagnoses:  Active Problems:   Paraesophageal hiatal hernia and GERD s/p Laparoscopic paraesophageal hiatal hernia repair with Nissen fundoplication 3/38/25   Discharged Condition: good  Hospital Course: She underwent the above procedure and tolerated it well. UGI on POD #1 demonstrated no residual hernia and no leak.  She was started on a liquid diet and did well with this. She was discharged on POD #2. Instructions were given to her.    Discharge Exam: Blood pressure 115/68, pulse 76, temperature 97.9 F (36.6 C), temperature source Oral, resp. rate 18, height 5\' 3"  (1.6 m), weight 106.1 kg (234 lb), last menstrual period 06/04/2014, SpO2 98 %.  Disposition: 01-Home or Self Care   Allergies as of 02/09/2017   No Known Allergies     Medication List    TAKE these medications   acetaminophen 325 MG tablet Commonly known as:  TYLENOL Take 325-650 mg by mouth every 6 (six) hours as needed for mild pain or moderate pain.   clonazePAM 1 MG tablet Commonly known as:  KLONOPIN Take 1 mg by mouth at bedtime as needed (sleep).   lisdexamfetamine 70 MG capsule Commonly known as:  VYVANSE Take 70 mg by mouth daily.   loratadine 10 MG tablet Commonly known as:  CLARITIN Take 10 mg by mouth daily.   metFORMIN 500 MG tablet Commonly known as:  GLUCOPHAGE Take 500 mg by mouth 2 (two) times daily with a meal.   multivitamin with minerals Tabs tablet Take 1 tablet by mouth daily.   omeprazole 20 MG capsule Commonly known as:  PRILOSEC Take 1 capsule (20 mg total) by mouth daily. What changed:  when to take this   ondansetron 4 MG tablet Commonly known as:  ZOFRAN Take 4 mg by mouth every 6 (six) hours as needed for nausea or vomiting.   oxyCODONE 5  MG/5ML solution Commonly known as:  ROXICODONE Take 5 mLs (5 mg total) by mouth every 4 (four) hours as needed for severe pain.   senna 8.6 MG Tabs tablet Commonly known as:  SENOKOT Take 1-2 tablets by mouth at bedtime as needed for mild constipation.   venlafaxine XR 150 MG 24 hr capsule Commonly known as:  EFFEXOR-XR Take 150 mg by mouth daily at 6 PM.   venlafaxine XR 75 MG 24 hr capsule Commonly known as:  EFFEXOR-XR Take 75 mg by mouth daily at 6 PM.   XARELTO 20 MG Tabs tablet Generic drug:  rivaroxaban Take 20 mg by mouth daily with supper.      Follow-up Information    Greer Pickerel, MD. Schedule an appointment as soon as possible for a visit in 3 week(s).   Specialty:  General Surgery Why:  for post-op check Contact information: [address] [place] [address] [phone]           Signed: Odis Hollingshead 02/21/2017, 2:23 PM

## 2017-02-28 DIAGNOSIS — F322 Major depressive disorder, single episode, severe without psychotic features: Secondary | ICD-10-CM | POA: Diagnosis not present

## 2017-03-28 ENCOUNTER — Other Ambulatory Visit (HOSPITAL_BASED_OUTPATIENT_CLINIC_OR_DEPARTMENT_OTHER): Payer: Federal, State, Local not specified - PPO

## 2017-03-28 ENCOUNTER — Ambulatory Visit (HOSPITAL_BASED_OUTPATIENT_CLINIC_OR_DEPARTMENT_OTHER): Payer: Federal, State, Local not specified - PPO | Admitting: Hematology & Oncology

## 2017-03-28 ENCOUNTER — Other Ambulatory Visit: Payer: Self-pay

## 2017-03-28 ENCOUNTER — Encounter: Payer: Self-pay | Admitting: Hematology & Oncology

## 2017-03-28 VITALS — BP 149/96 | HR 87 | Temp 98.5°F | Resp 16 | Wt 220.0 lb

## 2017-03-28 DIAGNOSIS — Z7901 Long term (current) use of anticoagulants: Secondary | ICD-10-CM

## 2017-03-28 DIAGNOSIS — D509 Iron deficiency anemia, unspecified: Secondary | ICD-10-CM

## 2017-03-28 DIAGNOSIS — D500 Iron deficiency anemia secondary to blood loss (chronic): Secondary | ICD-10-CM

## 2017-03-28 DIAGNOSIS — K9049 Malabsorption due to intolerance, not elsewhere classified: Secondary | ICD-10-CM | POA: Diagnosis not present

## 2017-03-28 DIAGNOSIS — K909 Intestinal malabsorption, unspecified: Secondary | ICD-10-CM

## 2017-03-28 LAB — CBC WITH DIFFERENTIAL (CANCER CENTER ONLY)
BASO#: 0 10*3/uL (ref 0.0–0.2)
BASO%: 0.2 % (ref 0.0–2.0)
EOS%: 0.2 % (ref 0.0–7.0)
Eosinophils Absolute: 0 10*3/uL (ref 0.0–0.5)
HCT: 43.4 % (ref 34.8–46.6)
HGB: 14.5 g/dL (ref 11.6–15.9)
LYMPH#: 1.4 10*3/uL (ref 0.9–3.3)
LYMPH%: 25.4 % (ref 14.0–48.0)
MCH: 27.5 pg (ref 26.0–34.0)
MCHC: 33.4 g/dL (ref 32.0–36.0)
MCV: 82 fL (ref 81–101)
MONO#: 0.4 10*3/uL (ref 0.1–0.9)
MONO%: 6.9 % (ref 0.0–13.0)
NEUT#: 3.8 10*3/uL (ref 1.5–6.5)
NEUT%: 67.3 % (ref 39.6–80.0)
Platelets: 270 10*3/uL (ref 145–400)
RBC: 5.27 10*6/uL (ref 3.70–5.32)
RDW: 20.9 % — ABNORMAL HIGH (ref 11.1–15.7)
WBC: 5.7 10*3/uL (ref 3.9–10.0)

## 2017-03-28 LAB — COMPREHENSIVE METABOLIC PANEL (CC13)
ALT: 14 IU/L (ref 0–32)
AST (SGOT): 15 IU/L (ref 0–40)
Albumin, Serum: 4.4 g/dL (ref 3.5–5.5)
Albumin/Globulin Ratio: 1.3 (ref 1.2–2.2)
Alkaline Phosphatase, S: 143 IU/L — ABNORMAL HIGH (ref 39–117)
BUN/Creatinine Ratio: 13 (ref 9–23)
BUN: 10 mg/dL (ref 6–24)
Bilirubin Total: 0.2 mg/dL (ref 0.0–1.2)
Calcium, Ser: 9.8 mg/dL (ref 8.7–10.2)
Carbon Dioxide, Total: 27 mmol/L (ref 20–29)
Chloride, Ser: 100 mmol/L (ref 96–106)
Creatinine, Ser: 0.8 mg/dL (ref 0.57–1.00)
GFR calc Af Amer: 96 mL/min/{1.73_m2} (ref >59)
GFR calc non Af Amer: 83 mL/min/{1.73_m2} (ref >59)
Globulin, Total: 3.4 g/dL (ref 1.5–4.5)
Glucose: 107 mg/dL — ABNORMAL HIGH (ref 65–99)
Potassium, Ser: 4.3 mmol/L (ref 3.5–5.2)
Sodium: 138 mmol/L (ref 134–144)
Total Protein: 7.8 g/dL (ref 6.0–8.5)

## 2017-03-28 LAB — CHCC SATELLITE - SMEAR

## 2017-03-28 NOTE — Progress Notes (Signed)
Hematology and Oncology Follow Up Visit  Samantha Maynard [id number] 09-May-1961 56 y.o. 03/28/2017   Principle Diagnosis:  Iron deficiency anemia Recurrent pulmonary emboli  Current Therapy:   IV iron as indicated - last received in September 2018 x 2 Xarelto 20 mg PO daily - Lifelong    Interim History:  [name] is here for follow-up.  She looks great.  She feels better.  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  She is now working with 2 fantastic radiation oncologists.  She got iron back in May.  When we e last saw her, her iron studies still show some iron deficiency.  Her ferritin was 7 with iron saturation of only 6%.  She did undergo hiatal hernia repair in September.  She got through this without any difficulties.  She has had no bleeding.  There is been no change in bowel or bladder habits.  She has had no leg swelling.  She does have compression stockings on her legs.  There is been no fever.  She has had no cough or shortness of breath.  There is been no chest wall pain.  She is on Xarelto.  She is doing well on full dose Xarelto.  I want to keep her on full dose Xarelto for right now.  Overall, her performance status is ECOG 0.    Medications:  Allergies as of 03/28/2017   No Known Allergies     Medication List        Accurate as of 03/28/17  1:45 PM. Always use your most recent med list.          acetaminophen 325 MG tablet Commonly known as:  TYLENOL Take 325-650 mg by mouth every 6 (six) hours as needed for mild pain or moderate pain.   clonazePAM 1 MG tablet Commonly known as:  KLONOPIN Take 1 mg by mouth at bedtime as needed (sleep).   lisdexamfetamine 70 MG capsule Commonly known as:  VYVANSE Take 70 mg by mouth daily.   loratadine 10 MG tablet Commonly known as:  CLARITIN Take 10 mg by mouth daily.   metFORMIN 500 MG tablet Commonly known as:  GLUCOPHAGE Take 500 mg by mouth 2 (two) times daily with a meal.     multivitamin with minerals Tabs tablet Take 1 tablet by mouth daily.   omeprazole 20 MG capsule Commonly known as:  PRILOSEC Take 1 capsule (20 mg total) by mouth daily.   ondansetron 4 MG tablet Commonly known as:  ZOFRAN Take 4 mg by mouth every 6 (six) hours as needed for nausea or vomiting.   senna 8.6 MG Tabs tablet Commonly known as:  SENOKOT Take 1-2 tablets by mouth at bedtime as needed for mild constipation.   venlafaxine XR 150 MG 24 hr capsule Commonly known as:  EFFEXOR-XR Take 150 mg by mouth daily at 6 PM.   venlafaxine XR 75 MG 24 hr capsule Commonly known as:  EFFEXOR-XR Take 75 mg by mouth daily at 6 PM.   XARELTO 20 MG Tabs tablet Generic drug:  rivaroxaban Take 20 mg by mouth daily with supper.       Allergies: No Known Allergies  Past Medical History, Surgical history, Social history, and Family History were reviewed and updated.  Review of Systems: As stated in the interim history   Physical Exam:  weight is 220 lb (99.8 kg). Her oral temperature is 98.5 F (36.9 C). Her blood pressure is 149/96 (abnormal) and her pulse  is 87. Her respiration is 16 and oxygen saturation is 98%.   Wt Readings from Last 3 Encounters:  03/28/17 220 lb (99.8 kg)  02/07/17 234 lb (106.1 kg)  01/30/17 234 lb (106.1 kg)    Physical Exam  Constitutional: She is oriented to person, place, and time.  HENT:  Head: Normocephalic and atraumatic.  Mouth/Throat: Oropharynx is clear and moist.  Eyes: EOM are normal. Pupils are equal, round, and reactive to light.  Neck: Normal range of motion.  Cardiovascular: Normal rate, regular rhythm and normal heart sounds.  Pulmonary/Chest: Effort normal and breath sounds normal.  Abdominal: Soft. Bowel sounds are normal.  Musculoskeletal: Normal range of motion. She exhibits no edema, tenderness or deformity.  Lymphadenopathy:    She has no cervical adenopathy.  Neurological: She is alert and oriented to person, place, and  time.  Skin: Skin is warm and dry. No rash noted. No erythema.  Psychiatric: She has a normal mood and affect. Her behavior is normal. Judgment and thought content normal.  Vitals reviewed.    Lab Results  Component Value Date   WBC 5.7 03/28/2017   HGB 14.5 03/28/2017   HCT 43.4 03/28/2017   MCV 82 03/28/2017   PLT 270 03/28/2017   Lab Results  Component Value Date   FERRITIN 7 (L) 01/23/2017   IRON 23 (L) 01/23/2017   TIBC 364 01/23/2017   UIBC 341 01/23/2017   IRONPCTSAT 6 (L) 01/23/2017   Lab Results  Component Value Date   RETICCTPCT 1.2 03/25/2014   RBC 5.27 03/28/2017   RETICCTABS 60.6 03/25/2014   No results found for: KPAFRELGTCHN, LAMBDASER, KAPLAMBRATIO No results found for: Kandis Cocking, IGMSERUM No results found for: Odetta Pink, SPEI   Chemistry      Component Value Date/Time   NA 139 02/08/2017 0501   NA 140 01/23/2017 1334   NA 142 09/20/2016 1131   NA 139 12/02/2015 1104   K 4.3 02/08/2017 0501   K 4.3 01/23/2017 1334   K 3.9 09/20/2016 1131   K 4.1 12/02/2015 1104   CL 102 02/08/2017 0501   CL 98 01/23/2017 1334   CL 102 09/20/2016 1131   CO2 27 02/08/2017 0501   CO2 27 01/23/2017 1334   CO2 27 09/20/2016 1131   CO2 25 12/02/2015 1104   BUN 8 02/08/2017 0501   BUN 11 01/23/2017 1334   BUN 10 09/20/2016 1131   BUN 17.5 12/02/2015 1104   CREATININE 0.83 02/08/2017 0501   CREATININE 0.93 01/23/2017 1334   CREATININE 0.8 09/20/2016 1131   CREATININE 0.9 12/02/2015 1104      Component Value Date/Time   CALCIUM 9.0 02/08/2017 0501   CALCIUM 9.8 01/23/2017 1334   CALCIUM 9.2 09/20/2016 1131   CALCIUM 9.3 12/02/2015 1104   ALKPHOS 134 (H) 01/23/2017 1334   ALKPHOS 151 (H) 09/20/2016 1131   ALKPHOS 135 12/02/2015 1104   AST 15 01/23/2017 1334   AST 21 09/20/2016 1131   AST 19 12/02/2015 1104   ALT 16 01/23/2017 1334   ALT 24 09/20/2016 1131   ALT 23 12/02/2015 1104   BILITOT <0.2  01/23/2017 1334   BILITOT 0.50 09/20/2016 1131   BILITOT <0.30 12/02/2015 1104      Impression and Plan: [name] is a very pleasant 56 year old white female. She has iron deficiency anemia. She is on Xarelto-lifelong.  We will have to see what her iron studies are.  It is possible S that  she just might not be absorbing iron all that well even though she gets it IV.  Her MCV is creeping up which is always a good sign.  Her hemoglobin has come up quite nicely.  I told her that she can come back whenever she would like to.  I just do not think we have to make a formal appointment for her.  She certainly is well aware of her body issues and if she feels that she needs iron or needs to have her blood checked, she will let us know.    I told her to make sure to say "hi" to the 2 radiation oncologist that she is working with.  Both women are incredible doctors and are very nice.     Volanda Napoleon, MD 11/8/20181:45 PM

## 2017-03-29 LAB — FERRITIN: Ferritin: 53 ng/ml (ref 9–269)

## 2017-03-29 LAB — IRON AND TIBC
%SAT: 23 % (ref 21–57)
Iron: 64 ug/dL (ref 41–142)
TIBC: 274 ug/dL (ref 236–444)
UIBC: 209 ug/dL (ref 120–384)

## 2017-05-29 DIAGNOSIS — K080 Exfoliation of teeth due to systemic causes: Secondary | ICD-10-CM | POA: Diagnosis not present

## 2017-06-06 DIAGNOSIS — F322 Major depressive disorder, single episode, severe without psychotic features: Secondary | ICD-10-CM | POA: Diagnosis not present

## 2017-06-18 ENCOUNTER — Other Ambulatory Visit: Payer: Self-pay | Admitting: Obstetrics and Gynecology

## 2017-06-18 DIAGNOSIS — Z139 Encounter for screening, unspecified: Secondary | ICD-10-CM

## 2017-06-20 DIAGNOSIS — H02839 Dermatochalasis of unspecified eye, unspecified eyelid: Secondary | ICD-10-CM | POA: Diagnosis not present

## 2017-06-20 DIAGNOSIS — H2513 Age-related nuclear cataract, bilateral: Secondary | ICD-10-CM | POA: Diagnosis not present

## 2017-06-20 DIAGNOSIS — E119 Type 2 diabetes mellitus without complications: Secondary | ICD-10-CM | POA: Diagnosis not present

## 2017-07-10 ENCOUNTER — Ambulatory Visit: Payer: Federal, State, Local not specified - PPO

## 2017-07-10 DIAGNOSIS — K080 Exfoliation of teeth due to systemic causes: Secondary | ICD-10-CM | POA: Diagnosis not present

## 2017-07-25 DIAGNOSIS — F322 Major depressive disorder, single episode, severe without psychotic features: Secondary | ICD-10-CM | POA: Diagnosis not present

## 2017-07-25 DIAGNOSIS — E119 Type 2 diabetes mellitus without complications: Secondary | ICD-10-CM | POA: Diagnosis not present

## 2017-07-25 DIAGNOSIS — I2699 Other pulmonary embolism without acute cor pulmonale: Secondary | ICD-10-CM | POA: Diagnosis not present

## 2017-07-25 DIAGNOSIS — F900 Attention-deficit hyperactivity disorder, predominantly inattentive type: Secondary | ICD-10-CM | POA: Diagnosis not present

## 2017-08-01 ENCOUNTER — Ambulatory Visit
Admission: RE | Admit: 2017-08-01 | Discharge: 2017-08-01 | Disposition: A | Discharge status: Home / Self Care | Payer: Federal, State, Local not specified - PPO | Source: Ambulatory Visit | Attending: Obstetrics and Gynecology | Admitting: Obstetrics and Gynecology

## 2017-08-01 DIAGNOSIS — Z1231 Encounter for screening mammogram for malignant neoplasm of breast: Secondary | ICD-10-CM | POA: Diagnosis not present

## 2017-08-01 DIAGNOSIS — Z139 Encounter for screening, unspecified: Secondary | ICD-10-CM

## 2017-08-29 DIAGNOSIS — F322 Major depressive disorder, single episode, severe without psychotic features: Secondary | ICD-10-CM | POA: Diagnosis not present

## 2017-10-02 DIAGNOSIS — K080 Exfoliation of teeth due to systemic causes: Secondary | ICD-10-CM | POA: Diagnosis not present

## 2017-10-21 DIAGNOSIS — K080 Exfoliation of teeth due to systemic causes: Secondary | ICD-10-CM | POA: Diagnosis not present

## 2017-11-03 IMAGING — RF DG UGI W/ GASTROGRAFIN
6 series · 17 of 17 positions shown · IV contrast (iopamidol)
Comparison: Upper GI dated July 04, 2016.

CLINICAL DATA: Postop day 1 status post Nissen fundoplication for
paraesophageal hiatal hernia.

EXAM:
WATER SOLUBLE UPPER GI SERIES
TECHNIQUE: Single-column upper GI series was performed using water soluble
contrast.
CONTRAST:  150mL K9JFKA-QCC IOPAMIDOL (K9JFKA-QCC) INJECTION 61%

[Series 1: t abdomen supine · 0.15mm/px · 1 of 1 slices shown]
[im 1/1]
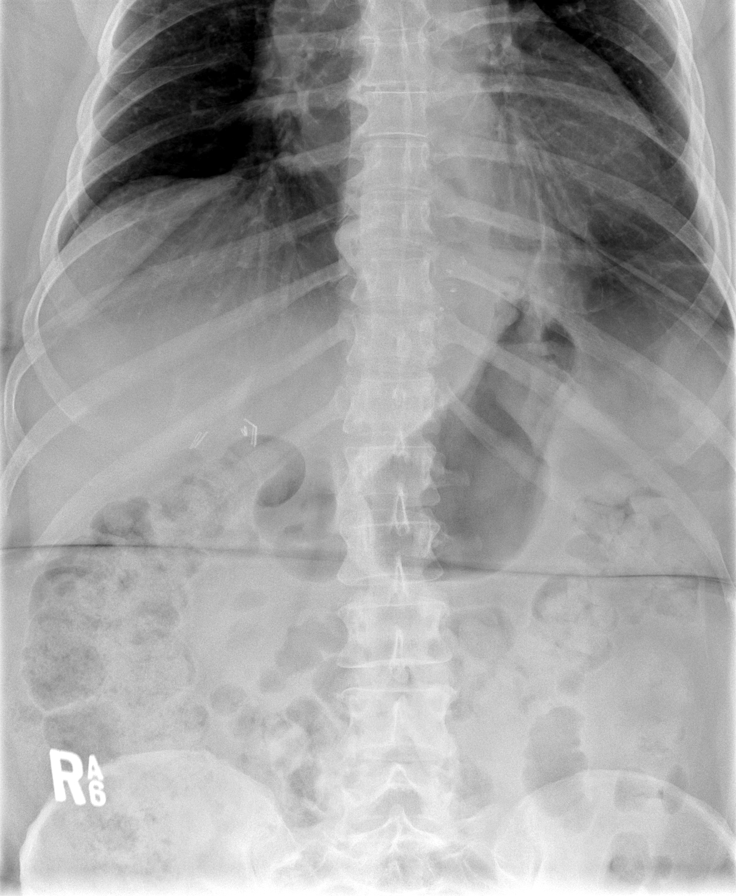

[Series 2: cp_standard · 0.53mm/px · 3 of 78 frames shown (1 of 4)]
[frame 12/78]
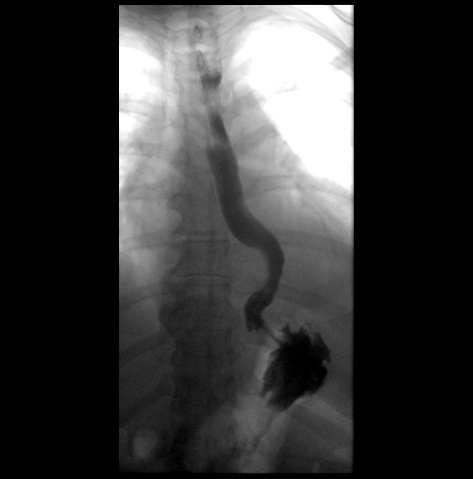
[frame 40/78]
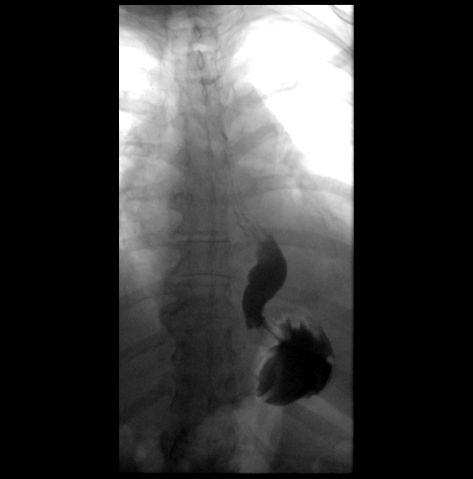
[frame 67/78]
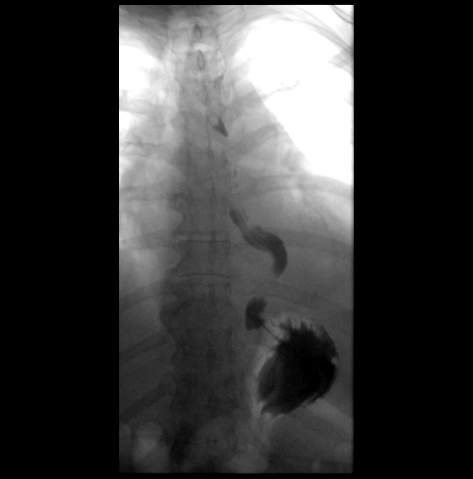

[Series 3: cp_standard · 0.36mm/px · 4 of 41 frames shown (2 of 4)]
[frame 7/41]
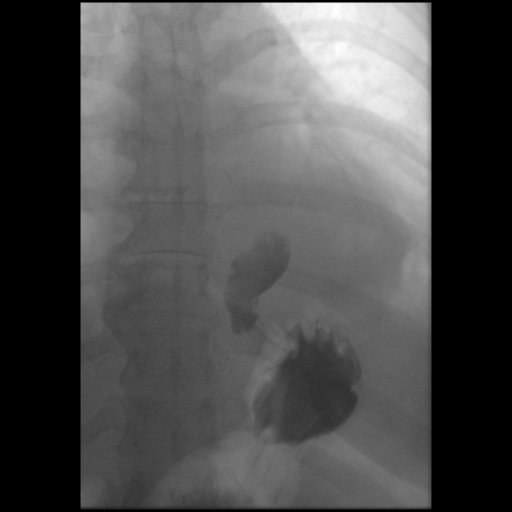
[frame 21/41]
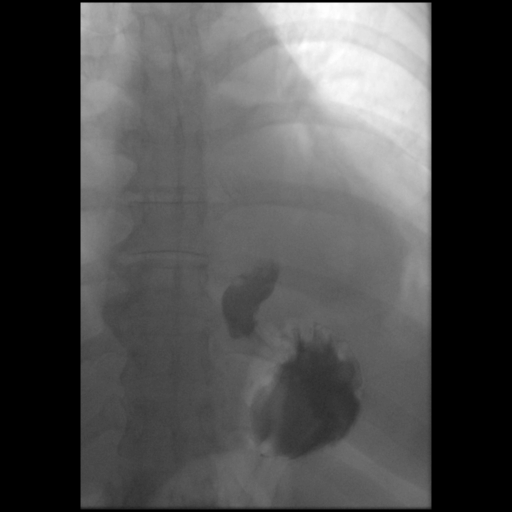
[frame 28/41]
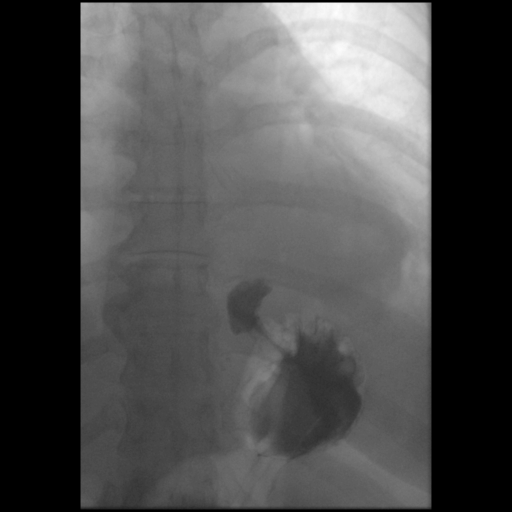
[frame 35/41]
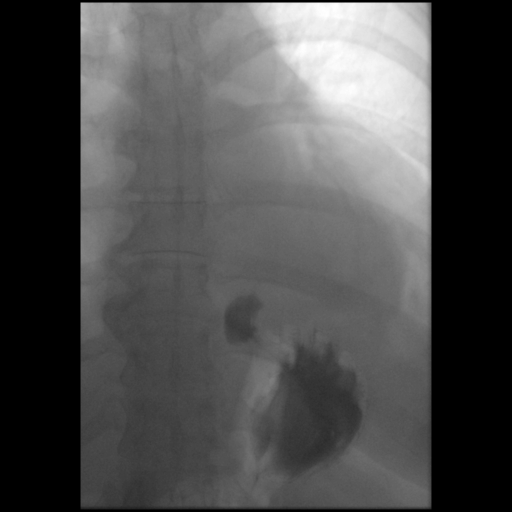

[Series 4: cp_standard · 0.36mm/px · 4 of 24 frames shown (3 of 4)]
[frame 4/24]
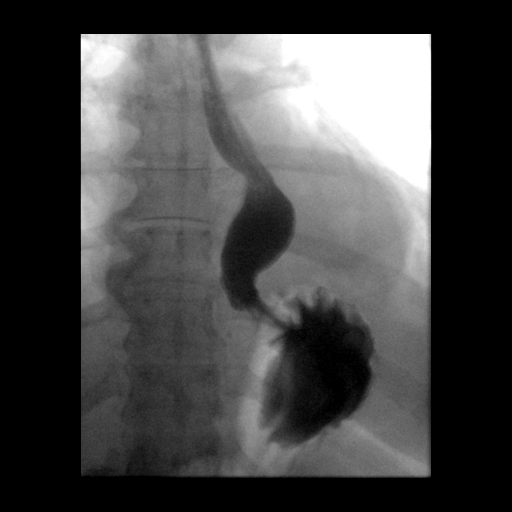
[frame 13/24]
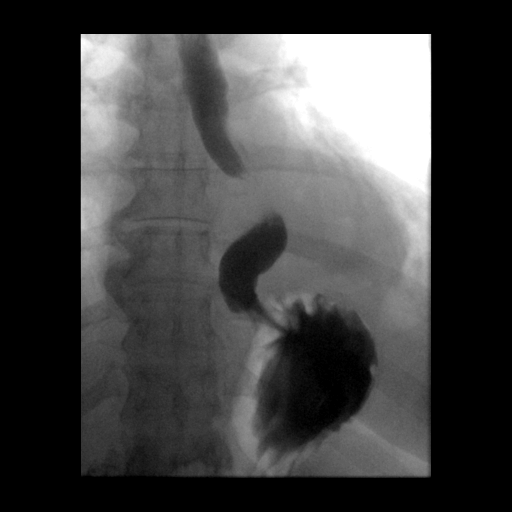
[frame 19/24]
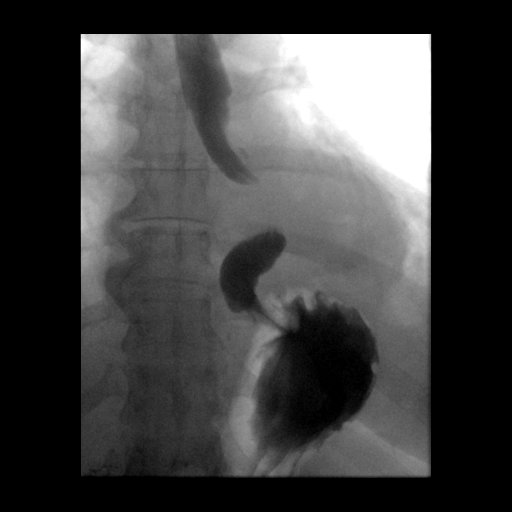
[frame 21/24]
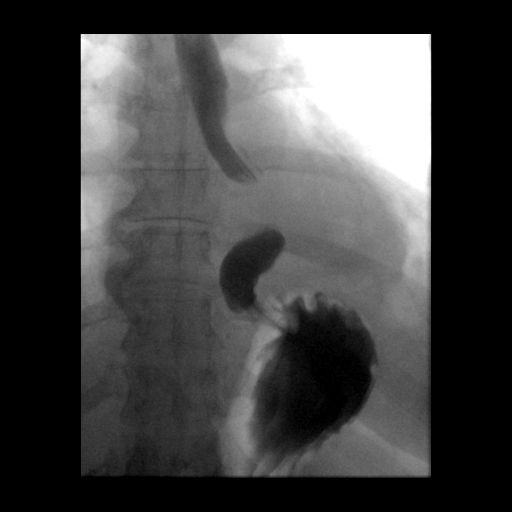

[Series 5: fluoro_barium 2fps_bw · 0.18mm/px · 1 of 1 slices shown]
[im 1/1]
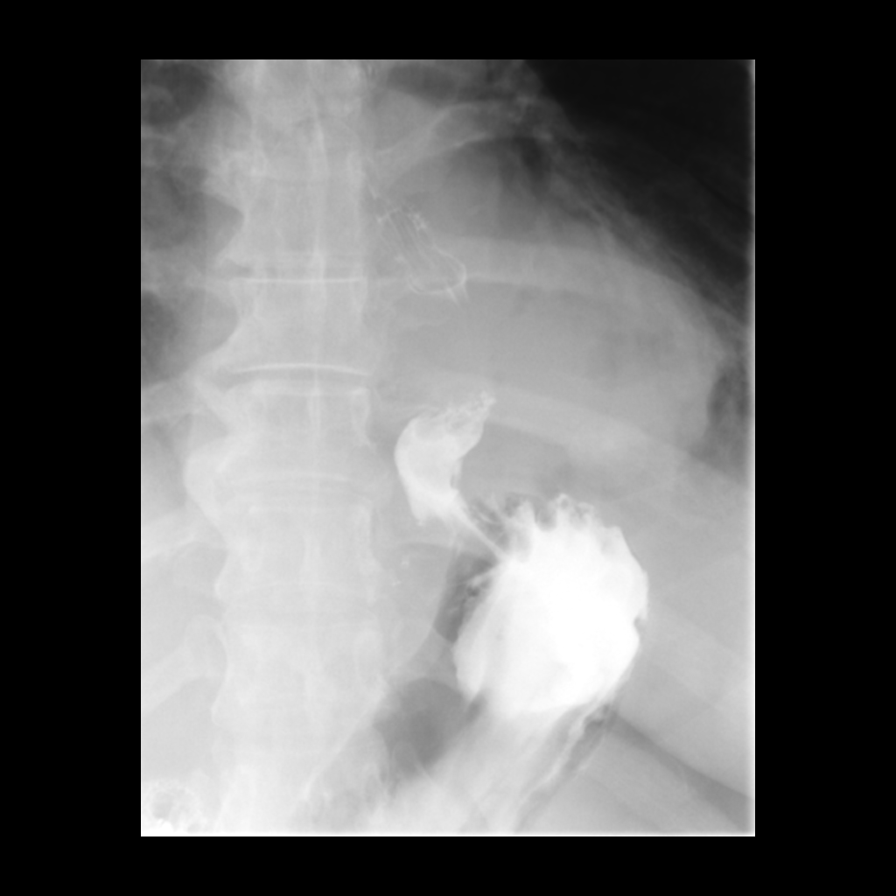

[Series 6: cp_standard · 0.36mm/px · 4 of 93 frames shown (4 of 4)]
[frame 14/93]
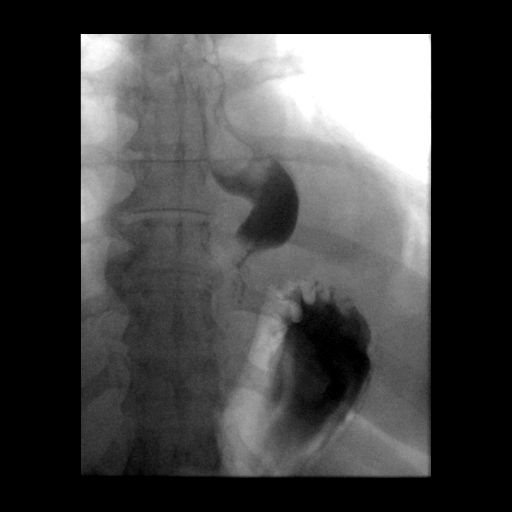
[frame 47/93]
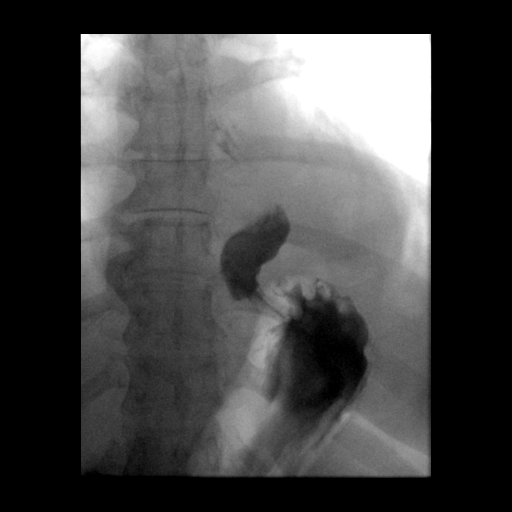
[frame 80/93]
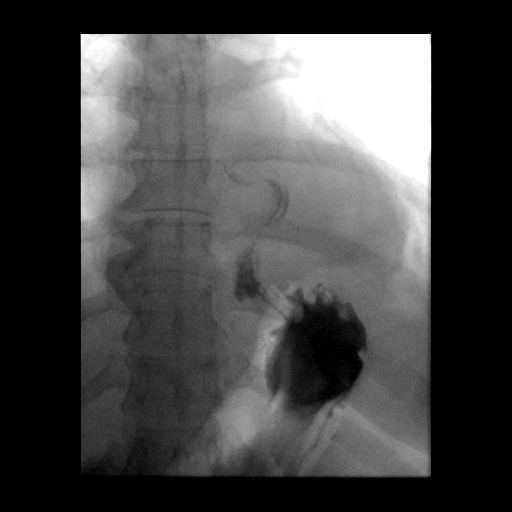
[frame 93/93]
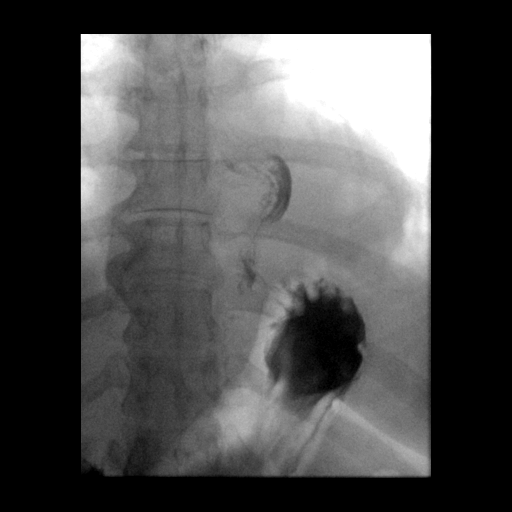

[17 of 17 positions shown; findings below may reference images not displayed]

FLUOROSCOPY TIME:  Fluoroscopy Time:  0.8 minutes.

Radiation Exposure Index (if provided by the fluoroscopic device):
24.9 mGy.

Number of Acquired Spot Images: 1
FINDINGS: Preliminary KUB demonstrates a nonobstructive bowel gas pattern.
Surgical clips are noted near the gastroesophageal junction. Prior
cholecystectomy.

Postsurgical changes related to Nissen fundoplication. No
obstruction to the forward flow of contrast throughout the esophagus
and into the stomach. No residual hiatal hernia. No evidence of
contrast leak. No gastroesophageal reflux occurred spontaneously.
IMPRESSION: 1. No residual hiatal hernia.
2. No evidence of contrast leak.

## 2017-11-28 DIAGNOSIS — F322 Major depressive disorder, single episode, severe without psychotic features: Secondary | ICD-10-CM | POA: Diagnosis not present

## 2017-12-18 DIAGNOSIS — Z01411 Encounter for gynecological examination (general) (routine) with abnormal findings: Secondary | ICD-10-CM | POA: Diagnosis not present

## 2018-01-21 DIAGNOSIS — Z0000 Encounter for general adult medical examination without abnormal findings: Secondary | ICD-10-CM | POA: Diagnosis not present

## 2018-01-21 DIAGNOSIS — K219 Gastro-esophageal reflux disease without esophagitis: Secondary | ICD-10-CM | POA: Diagnosis not present

## 2018-01-21 DIAGNOSIS — F900 Attention-deficit hyperactivity disorder, predominantly inattentive type: Secondary | ICD-10-CM | POA: Diagnosis not present

## 2018-01-21 DIAGNOSIS — Z1389 Encounter for screening for other disorder: Secondary | ICD-10-CM | POA: Diagnosis not present

## 2018-01-21 DIAGNOSIS — E1169 Type 2 diabetes mellitus with other specified complication: Secondary | ICD-10-CM | POA: Diagnosis not present

## 2018-01-21 DIAGNOSIS — Z13220 Encounter for screening for lipoid disorders: Secondary | ICD-10-CM | POA: Diagnosis not present

## 2018-02-11 DIAGNOSIS — K080 Exfoliation of teeth due to systemic causes: Secondary | ICD-10-CM | POA: Diagnosis not present

## 2018-02-28 DIAGNOSIS — F322 Major depressive disorder, single episode, severe without psychotic features: Secondary | ICD-10-CM | POA: Diagnosis not present

## 2018-05-29 DIAGNOSIS — F322 Major depressive disorder, single episode, severe without psychotic features: Secondary | ICD-10-CM | POA: Diagnosis not present

## 2018-06-17 DIAGNOSIS — E119 Type 2 diabetes mellitus without complications: Secondary | ICD-10-CM | POA: Diagnosis not present

## 2018-06-17 DIAGNOSIS — R1110 Vomiting, unspecified: Secondary | ICD-10-CM | POA: Diagnosis not present

## 2018-06-17 DIAGNOSIS — Z79899 Other long term (current) drug therapy: Secondary | ICD-10-CM | POA: Diagnosis not present

## 2018-06-17 DIAGNOSIS — Z86711 Personal history of pulmonary embolism: Secondary | ICD-10-CM | POA: Diagnosis not present

## 2018-06-17 DIAGNOSIS — R112 Nausea with vomiting, unspecified: Secondary | ICD-10-CM | POA: Diagnosis not present

## 2018-06-17 DIAGNOSIS — K5900 Constipation, unspecified: Secondary | ICD-10-CM | POA: Diagnosis not present

## 2018-06-26 DIAGNOSIS — H02839 Dermatochalasis of unspecified eye, unspecified eyelid: Secondary | ICD-10-CM | POA: Diagnosis not present

## 2018-06-26 DIAGNOSIS — H5203 Hypermetropia, bilateral: Secondary | ICD-10-CM | POA: Diagnosis not present

## 2018-06-26 DIAGNOSIS — H2513 Age-related nuclear cataract, bilateral: Secondary | ICD-10-CM | POA: Diagnosis not present

## 2018-06-26 DIAGNOSIS — E119 Type 2 diabetes mellitus without complications: Secondary | ICD-10-CM | POA: Diagnosis not present

## 2018-07-04 DIAGNOSIS — R110 Nausea: Secondary | ICD-10-CM | POA: Diagnosis not present

## 2018-07-04 DIAGNOSIS — R309 Painful micturition, unspecified: Secondary | ICD-10-CM | POA: Diagnosis not present

## 2018-07-24 ENCOUNTER — Other Ambulatory Visit: Payer: Self-pay | Admitting: Obstetrics and Gynecology

## 2018-07-24 DIAGNOSIS — Z1231 Encounter for screening mammogram for malignant neoplasm of breast: Secondary | ICD-10-CM

## 2018-08-20 ENCOUNTER — Ambulatory Visit: Payer: Federal, State, Local not specified - PPO

## 2018-08-28 DIAGNOSIS — F322 Major depressive disorder, single episode, severe without psychotic features: Secondary | ICD-10-CM | POA: Diagnosis not present

## 2018-09-30 ENCOUNTER — Other Ambulatory Visit: Payer: Self-pay

## 2018-09-30 ENCOUNTER — Ambulatory Visit
Admission: RE | Admit: 2018-09-30 | Discharge: 2018-09-30 | Disposition: A | Discharge status: Home / Self Care | Payer: Federal, State, Local not specified - PPO | Source: Ambulatory Visit | Attending: Obstetrics and Gynecology | Admitting: Obstetrics and Gynecology

## 2018-09-30 DIAGNOSIS — Z1231 Encounter for screening mammogram for malignant neoplasm of breast: Secondary | ICD-10-CM | POA: Diagnosis not present

## 2018-10-06 ENCOUNTER — Ambulatory Visit: Payer: Federal, State, Local not specified - PPO

## 2018-11-06 DIAGNOSIS — I2699 Other pulmonary embolism without acute cor pulmonale: Secondary | ICD-10-CM | POA: Diagnosis not present

## 2018-11-06 DIAGNOSIS — E1169 Type 2 diabetes mellitus with other specified complication: Secondary | ICD-10-CM | POA: Diagnosis not present

## 2018-11-06 DIAGNOSIS — F322 Major depressive disorder, single episode, severe without psychotic features: Secondary | ICD-10-CM | POA: Diagnosis not present

## 2018-11-06 DIAGNOSIS — K219 Gastro-esophageal reflux disease without esophagitis: Secondary | ICD-10-CM | POA: Diagnosis not present

## 2018-11-20 DIAGNOSIS — F322 Major depressive disorder, single episode, severe without psychotic features: Secondary | ICD-10-CM | POA: Diagnosis not present

## 2018-12-25 DIAGNOSIS — N390 Urinary tract infection, site not specified: Secondary | ICD-10-CM | POA: Diagnosis not present

## 2018-12-25 DIAGNOSIS — R350 Frequency of micturition: Secondary | ICD-10-CM | POA: Diagnosis not present

## 2018-12-25 DIAGNOSIS — R319 Hematuria, unspecified: Secondary | ICD-10-CM | POA: Diagnosis not present

## 2019-01-05 ENCOUNTER — Other Ambulatory Visit: Payer: Self-pay

## 2019-01-05 ENCOUNTER — Other Ambulatory Visit: Payer: Self-pay | Admitting: Family

## 2019-01-05 ENCOUNTER — Inpatient Hospital Stay (HOSPITAL_BASED_OUTPATIENT_CLINIC_OR_DEPARTMENT_OTHER): Payer: Federal, State, Local not specified - PPO | Admitting: Family

## 2019-01-05 ENCOUNTER — Inpatient Hospital Stay: Payer: Federal, State, Local not specified - PPO | Attending: Hematology & Oncology

## 2019-01-05 ENCOUNTER — Telehealth: Payer: Self-pay | Admitting: Family

## 2019-01-05 ENCOUNTER — Encounter: Payer: Self-pay | Admitting: Family

## 2019-01-05 VITALS — BP 148/77 | HR 110 | Temp 97.1°F | Resp 19 | Ht 62.0 in | Wt 226.0 lb

## 2019-01-05 DIAGNOSIS — D500 Iron deficiency anemia secondary to blood loss (chronic): Secondary | ICD-10-CM | POA: Diagnosis not present

## 2019-01-05 DIAGNOSIS — Z7901 Long term (current) use of anticoagulants: Secondary | ICD-10-CM | POA: Diagnosis not present

## 2019-01-05 DIAGNOSIS — D509 Iron deficiency anemia, unspecified: Secondary | ICD-10-CM | POA: Insufficient documentation

## 2019-01-05 DIAGNOSIS — Z79899 Other long term (current) drug therapy: Secondary | ICD-10-CM | POA: Insufficient documentation

## 2019-01-05 DIAGNOSIS — K909 Intestinal malabsorption, unspecified: Secondary | ICD-10-CM

## 2019-01-05 DIAGNOSIS — Z7984 Long term (current) use of oral hypoglycemic drugs: Secondary | ICD-10-CM | POA: Diagnosis not present

## 2019-01-05 DIAGNOSIS — Z86711 Personal history of pulmonary embolism: Secondary | ICD-10-CM | POA: Diagnosis not present

## 2019-01-05 DIAGNOSIS — R5383 Other fatigue: Secondary | ICD-10-CM | POA: Insufficient documentation

## 2019-01-05 LAB — CMP (CANCER CENTER ONLY)
ALT: 17 U/L (ref 0–44)
AST: 16 U/L (ref 15–41)
Albumin: 4 g/dL (ref 3.5–5.0)
Alkaline Phosphatase: 151 U/L — ABNORMAL HIGH (ref 38–126)
Anion gap: 10 (ref 5–15)
BUN: 14 mg/dL (ref 6–20)
CO2: 26 mmol/L (ref 22–32)
Calcium: 8.7 mg/dL — ABNORMAL LOW (ref 8.9–10.3)
Chloride: 101 mmol/L (ref 98–111)
Creatinine: 0.95 mg/dL (ref 0.44–1.00)
GFR, Est AFR Am: >60 mL/min (ref >60)
GFR, Estimated: >60 mL/min (ref >60)
Glucose, Bld: 154 mg/dL — ABNORMAL HIGH (ref 70–99)
Potassium: 4.3 mmol/L (ref 3.5–5.1)
Sodium: 137 mmol/L (ref 135–145)
Total Bilirubin: 0.2 mg/dL — ABNORMAL LOW (ref 0.3–1.2)
Total Protein: 6.8 g/dL (ref 6.5–8.1)

## 2019-01-05 LAB — RETICULOCYTES
Immature Retic Fract: 23.3 % — ABNORMAL HIGH (ref 2.3–15.9)
RBC.: 5.01 MIL/uL (ref 3.87–5.11)
Retic Count, Absolute: 92.2 10*3/uL (ref 19.0–186.0)
Retic Ct Pct: 1.8 % (ref 0.4–3.1)

## 2019-01-05 LAB — CBC WITH DIFFERENTIAL (CANCER CENTER ONLY)
Abs Immature Granulocytes: 0.06 10*3/uL (ref 0.00–0.07)
Basophils Absolute: 0 10*3/uL (ref 0.0–0.1)
Basophils Relative: 0 %
Eosinophils Absolute: 0 10*3/uL (ref 0.0–0.5)
Eosinophils Relative: 0 %
HCT: 39.2 % (ref 36.0–46.0)
Hemoglobin: 11.8 g/dL — ABNORMAL LOW (ref 12.0–15.0)
Immature Granulocytes: 1 %
Lymphocytes Relative: 26 %
Lymphs Abs: 1.4 10*3/uL (ref 0.7–4.0)
MCH: 23.5 pg — ABNORMAL LOW (ref 26.0–34.0)
MCHC: 30.1 g/dL (ref 30.0–36.0)
MCV: 77.9 fL — ABNORMAL LOW (ref 80.0–100.0)
Monocytes Absolute: 0.3 10*3/uL (ref 0.1–1.0)
Monocytes Relative: 6 %
Neutro Abs: 3.5 10*3/uL (ref 1.7–7.7)
Neutrophils Relative %: 67 %
Platelet Count: 342 10*3/uL (ref 150–400)
RBC: 5.03 MIL/uL (ref 3.87–5.11)
RDW: 16 % — ABNORMAL HIGH (ref 11.5–15.5)
WBC Count: 5.2 10*3/uL (ref 4.0–10.5)
nRBC: 0 % (ref 0.0–0.2)

## 2019-01-05 NOTE — Progress Notes (Signed)
Hematology and Oncology Follow Up Visit  Samantha Maynard [id number] 21-Jul-1960 58 y.o. 01/05/2019   Principle Diagnosis:  Iron deficiency anemia Recurrent pulmonary emboli  Current Therapy:    IV iron as indicated Xarelto 20 mg PO daily - Lifelong    Interim History:  Samantha Maynard is here today for follow-up. She is feeling fatigued, sleeping a lot and SOB with exertion.  She has not noted any bleeding, no bruising or petechiae.  She is doing well on Xarelto 20 mg PO managed by Dr. Deforest Hoyles.  No fever, chills, n/v, cough, rash, dizziness, SOB, chest pain, palpitations, abdominal pain or changes in bowel or bladder habits.  She has puffiness in the feet and ankles that comes and goes. This is unchanged from what she states is her "normal".  She is eating well and staying hydrated. Her weight is stable.   ECOG Performance Status: 1 - Symptomatic but completely ambulatory  Medications:  Allergies as of 01/05/2019   No Known Allergies     Medication List       Accurate as of January 05, 2019  2:26 PM. If you have any questions, ask your nurse or doctor.        acetaminophen 325 MG tablet Commonly known as: TYLENOL Take 325-650 mg by mouth every 6 (six) hours as needed for mild pain or moderate pain.   clonazePAM 1 MG tablet Commonly known as: KLONOPIN Take 1 mg by mouth at bedtime as needed (sleep).   lisdexamfetamine 70 MG capsule Commonly known as: VYVANSE Take 70 mg by mouth daily.   loratadine 10 MG tablet Commonly known as: CLARITIN Take 10 mg by mouth daily.   metFORMIN 500 MG tablet Commonly known as: GLUCOPHAGE Take 500 mg by mouth 2 (two) times daily with a meal.   multivitamin with minerals Tabs tablet Take 1 tablet by mouth daily.   omeprazole 20 MG capsule Commonly known as: PRILOSEC Take 1 capsule (20 mg total) by mouth daily. What changed: when to take this   ondansetron 4 MG tablet Commonly known as: ZOFRAN Take 4 mg by mouth every 6 (six)  hours as needed for nausea or vomiting.   senna 8.6 MG Tabs tablet Commonly known as: SENOKOT Take 1-2 tablets by mouth at bedtime as needed for mild constipation.   venlafaxine XR 150 MG 24 hr capsule Commonly known as: EFFEXOR-XR Take 150 mg by mouth daily at 6 PM.   venlafaxine XR 75 MG 24 hr capsule Commonly known as: EFFEXOR-XR Take 75 mg by mouth daily at 6 PM.   Xarelto 20 MG Tabs tablet Generic drug: rivaroxaban Take 20 mg by mouth daily with supper.       Allergies: No Known Allergies  Past Medical History, Surgical history, Social history, and Family History were reviewed and updated.  Review of Systems: All other 10 point review of systems is negative.   Physical Exam:  vitals were not taken for this visit.   Wt Readings from Last 3 Encounters:  03/28/17 220 lb (99.8 kg)  02/07/17 234 lb (106.1 kg)  01/30/17 234 lb (106.1 kg)    Ocular: Sclerae unicteric, pupils equal, round and reactive to light Ear-nose-throat: Oropharynx clear, dentition fair Lymphatic: No cervical or supraclavicular adenopathy Lungs no rales or rhonchi, good excursion bilaterally Heart regular rate and rhythm, no murmur appreciated Abd soft, nontender, positive bowel sounds, no liver or spleen tip palpated on exam, no fluid wave  MSK no focal spinal tenderness, no joint edema Neuro:  non-focal, well-oriented, appropriate affect Breasts: Deferred   Lab Results  Component Value Date   WBC 5.2 01/05/2019   HGB 11.8 (L) 01/05/2019   HCT 39.2 01/05/2019   MCV 77.9 (L) 01/05/2019   PLT 342 01/05/2019   Lab Results  Component Value Date   FERRITIN 53 03/28/2017   IRON 64 03/28/2017   TIBC 274 03/28/2017   UIBC 209 03/28/2017   IRONPCTSAT 23 03/28/2017   Lab Results  Component Value Date   RETICCTPCT 1.8 01/05/2019   RBC 5.01 01/05/2019   RETICCTABS 60.6 03/25/2014   No results found for: KPAFRELGTCHN, LAMBDASER, KAPLAMBRATIO No results found for: IGGSERUM, IGA, IGMSERUM No  results found for: Odetta Pink, SPEI   Chemistry      Component Value Date/Time   NA 138 03/28/2017 1312   NA 142 09/20/2016 1131   NA 139 12/02/2015 1104   K 4.3 03/28/2017 1312   K 3.9 09/20/2016 1131   K 4.1 12/02/2015 1104   CL 100 03/28/2017 1312   CL 102 09/20/2016 1131   CO2 27 03/28/2017 1312   CO2 27 09/20/2016 1131   CO2 25 12/02/2015 1104   BUN 10 03/28/2017 1312   BUN 10 09/20/2016 1131   BUN 17.5 12/02/2015 1104   CREATININE 0.80 03/28/2017 1312   CREATININE 0.8 09/20/2016 1131   CREATININE 0.9 12/02/2015 1104      Component Value Date/Time   CALCIUM 9.8 03/28/2017 1312   CALCIUM 9.2 09/20/2016 1131   CALCIUM 9.3 12/02/2015 1104   ALKPHOS 143 (H) 03/28/2017 1312   ALKPHOS 151 (H) 09/20/2016 1131   ALKPHOS 135 12/02/2015 1104   AST 15 03/28/2017 1312   AST 21 09/20/2016 1131   AST 19 12/02/2015 1104   ALT 14 03/28/2017 1312   ALT 24 09/20/2016 1131   ALT 23 12/02/2015 1104   BILITOT 0.2 03/28/2017 1312   BILITOT 0.50 09/20/2016 1131   BILITOT <0.30 12/02/2015 1104       Impression and Plan: Samantha Maynard is a very pleasant 58 yo caucasian female with history of recurrent pulmonary emboli on lifelong anticoagulation with Xarelto managed by her PCP Dr. Deforest Hoyles.  She also has history of iron deficiency anemia.  We will see what her iron studies show and bring her back in for infusion if needed.  We will plan to see her back in another year.   She will contact our office with any questions or concerns. We can certainly see her sooner if needed.   Laverna Peace, NP 8/17/20202:26 PM

## 2019-01-05 NOTE — Telephone Encounter (Signed)
Appointment scheduled lmvm w/ spouse per 8/17 los

## 2019-01-06 ENCOUNTER — Telehealth: Payer: Self-pay | Admitting: Family

## 2019-01-06 LAB — IRON AND TIBC
Iron: 32 ug/dL — ABNORMAL LOW (ref 41–142)
Saturation Ratios: 8 % — ABNORMAL LOW (ref 21–57)
TIBC: 395 ug/dL (ref 236–444)
UIBC: 363 ug/dL (ref 120–384)

## 2019-01-06 LAB — FERRITIN: Ferritin: 5 ng/mL — ABNORMAL LOW (ref 11–307)

## 2019-01-06 NOTE — Telephone Encounter (Signed)
lmom to inform patient of iron infusion 8/21 and 8/28 at 2 pm per 8/18 sch msg

## 2019-01-09 ENCOUNTER — Other Ambulatory Visit: Payer: Self-pay

## 2019-01-09 ENCOUNTER — Inpatient Hospital Stay: Payer: Federal, State, Local not specified - PPO

## 2019-01-09 VITALS — BP 124/90 | HR 81 | Resp 17

## 2019-01-09 DIAGNOSIS — R5383 Other fatigue: Secondary | ICD-10-CM | POA: Diagnosis not present

## 2019-01-09 DIAGNOSIS — D509 Iron deficiency anemia, unspecified: Secondary | ICD-10-CM | POA: Diagnosis not present

## 2019-01-09 DIAGNOSIS — Z7984 Long term (current) use of oral hypoglycemic drugs: Secondary | ICD-10-CM | POA: Diagnosis not present

## 2019-01-09 DIAGNOSIS — Z79899 Other long term (current) drug therapy: Secondary | ICD-10-CM | POA: Diagnosis not present

## 2019-01-09 DIAGNOSIS — Z7901 Long term (current) use of anticoagulants: Secondary | ICD-10-CM | POA: Diagnosis not present

## 2019-01-09 DIAGNOSIS — K909 Intestinal malabsorption, unspecified: Secondary | ICD-10-CM

## 2019-01-09 DIAGNOSIS — Z86711 Personal history of pulmonary embolism: Secondary | ICD-10-CM | POA: Diagnosis not present

## 2019-01-09 MED ORDER — SODIUM CHLORIDE 0.9 % IV SOLN
INTRAVENOUS | Status: DC
  Administered 2019-01-09: 15:00:00 via INTRAVENOUS
  Filled 2019-01-09: qty 250

## 2019-01-09 MED ORDER — SODIUM CHLORIDE 0.9 % IV SOLN
510.0000 mg | Freq: Once | INTRAVENOUS | Status: AC
  Administered 2019-01-09: 510 mg via INTRAVENOUS
  Filled 2019-01-09: qty 510

## 2019-01-09 NOTE — Patient Instructions (Signed)

## 2019-01-16 ENCOUNTER — Other Ambulatory Visit: Payer: Self-pay

## 2019-01-16 ENCOUNTER — Inpatient Hospital Stay: Payer: Federal, State, Local not specified - PPO

## 2019-01-16 VITALS — BP 131/79 | HR 91 | Temp 97.5°F | Resp 18

## 2019-01-16 DIAGNOSIS — D509 Iron deficiency anemia, unspecified: Secondary | ICD-10-CM

## 2019-01-16 DIAGNOSIS — K909 Intestinal malabsorption, unspecified: Secondary | ICD-10-CM

## 2019-01-16 DIAGNOSIS — Z79899 Other long term (current) drug therapy: Secondary | ICD-10-CM | POA: Diagnosis not present

## 2019-01-16 DIAGNOSIS — Z7901 Long term (current) use of anticoagulants: Secondary | ICD-10-CM | POA: Diagnosis not present

## 2019-01-16 DIAGNOSIS — R5383 Other fatigue: Secondary | ICD-10-CM | POA: Diagnosis not present

## 2019-01-16 DIAGNOSIS — Z7984 Long term (current) use of oral hypoglycemic drugs: Secondary | ICD-10-CM | POA: Diagnosis not present

## 2019-01-16 DIAGNOSIS — Z86711 Personal history of pulmonary embolism: Secondary | ICD-10-CM | POA: Diagnosis not present

## 2019-01-16 MED ORDER — SODIUM CHLORIDE 0.9 % IV SOLN
510.0000 mg | Freq: Once | INTRAVENOUS | Status: AC
  Administered 2019-01-16: 510 mg via INTRAVENOUS
  Filled 2019-01-16: qty 17

## 2019-01-16 MED ORDER — SODIUM CHLORIDE 0.9 % IV SOLN
INTRAVENOUS | Status: DC
  Administered 2019-01-16: 15:00:00 via INTRAVENOUS
  Filled 2019-01-16: qty 250

## 2019-01-16 NOTE — Patient Instructions (Signed)

## 2019-02-25 DIAGNOSIS — F322 Major depressive disorder, single episode, severe without psychotic features: Secondary | ICD-10-CM | POA: Diagnosis not present

## 2019-03-02 DIAGNOSIS — Z01411 Encounter for gynecological examination (general) (routine) with abnormal findings: Secondary | ICD-10-CM | POA: Diagnosis not present

## 2019-03-02 DIAGNOSIS — R21 Rash and other nonspecific skin eruption: Secondary | ICD-10-CM | POA: Diagnosis not present

## 2019-03-10 DIAGNOSIS — E1169 Type 2 diabetes mellitus with other specified complication: Secondary | ICD-10-CM | POA: Diagnosis not present

## 2019-03-10 DIAGNOSIS — D509 Iron deficiency anemia, unspecified: Secondary | ICD-10-CM | POA: Diagnosis not present

## 2019-03-10 DIAGNOSIS — Z0000 Encounter for general adult medical examination without abnormal findings: Secondary | ICD-10-CM | POA: Diagnosis not present

## 2019-03-10 DIAGNOSIS — Z1389 Encounter for screening for other disorder: Secondary | ICD-10-CM | POA: Diagnosis not present

## 2019-03-10 DIAGNOSIS — Z23 Encounter for immunization: Secondary | ICD-10-CM | POA: Diagnosis not present

## 2019-03-10 DIAGNOSIS — Z13220 Encounter for screening for lipoid disorders: Secondary | ICD-10-CM | POA: Diagnosis not present

## 2019-05-06 DIAGNOSIS — N3000 Acute cystitis without hematuria: Secondary | ICD-10-CM | POA: Diagnosis not present

## 2019-05-06 DIAGNOSIS — R300 Dysuria: Secondary | ICD-10-CM | POA: Diagnosis not present

## 2019-05-20 DIAGNOSIS — F322 Major depressive disorder, single episode, severe without psychotic features: Secondary | ICD-10-CM | POA: Diagnosis not present

## 2019-08-11 DIAGNOSIS — F322 Major depressive disorder, single episode, severe without psychotic features: Secondary | ICD-10-CM | POA: Diagnosis not present

## 2019-09-14 ENCOUNTER — Other Ambulatory Visit: Payer: Self-pay | Admitting: Obstetrics and Gynecology

## 2019-09-14 DIAGNOSIS — Z1231 Encounter for screening mammogram for malignant neoplasm of breast: Secondary | ICD-10-CM

## 2019-10-01 ENCOUNTER — Other Ambulatory Visit: Payer: Self-pay

## 2019-10-01 ENCOUNTER — Ambulatory Visit
Admission: RE | Admit: 2019-10-01 | Discharge: 2019-10-01 | Disposition: A | Discharge status: Home / Self Care | Payer: Federal, State, Local not specified - PPO | Source: Ambulatory Visit | Attending: Obstetrics and Gynecology | Admitting: Obstetrics and Gynecology

## 2019-10-01 DIAGNOSIS — Z1231 Encounter for screening mammogram for malignant neoplasm of breast: Secondary | ICD-10-CM | POA: Diagnosis not present

## 2019-10-05 DIAGNOSIS — E1169 Type 2 diabetes mellitus with other specified complication: Secondary | ICD-10-CM | POA: Diagnosis not present

## 2019-10-05 DIAGNOSIS — F900 Attention-deficit hyperactivity disorder, predominantly inattentive type: Secondary | ICD-10-CM | POA: Diagnosis not present

## 2019-10-05 DIAGNOSIS — K219 Gastro-esophageal reflux disease without esophagitis: Secondary | ICD-10-CM | POA: Diagnosis not present

## 2019-10-05 DIAGNOSIS — I2699 Other pulmonary embolism without acute cor pulmonale: Secondary | ICD-10-CM | POA: Diagnosis not present

## 2019-10-27 DIAGNOSIS — R000 Tachycardia, unspecified: Secondary | ICD-10-CM | POA: Diagnosis not present

## 2019-10-27 DIAGNOSIS — Z86711 Personal history of pulmonary embolism: Secondary | ICD-10-CM | POA: Diagnosis not present

## 2019-10-27 DIAGNOSIS — R0789 Other chest pain: Secondary | ICD-10-CM | POA: Diagnosis not present

## 2019-10-27 DIAGNOSIS — E119 Type 2 diabetes mellitus without complications: Secondary | ICD-10-CM | POA: Diagnosis not present

## 2019-10-27 DIAGNOSIS — R079 Chest pain, unspecified: Secondary | ICD-10-CM | POA: Diagnosis not present

## 2019-10-27 DIAGNOSIS — Z7951 Long term (current) use of inhaled steroids: Secondary | ICD-10-CM | POA: Diagnosis not present

## 2019-10-27 DIAGNOSIS — Z79899 Other long term (current) drug therapy: Secondary | ICD-10-CM | POA: Diagnosis not present

## 2019-10-29 DIAGNOSIS — E113292 Type 2 diabetes mellitus with mild nonproliferative diabetic retinopathy without macular edema, left eye: Secondary | ICD-10-CM | POA: Diagnosis not present

## 2019-10-29 DIAGNOSIS — H524 Presbyopia: Secondary | ICD-10-CM | POA: Diagnosis not present

## 2019-11-03 DIAGNOSIS — F322 Major depressive disorder, single episode, severe without psychotic features: Secondary | ICD-10-CM | POA: Diagnosis not present

## 2019-11-17 ENCOUNTER — Other Ambulatory Visit: Payer: Self-pay

## 2019-11-17 ENCOUNTER — Ambulatory Visit: Payer: Federal, State, Local not specified - PPO | Admitting: Cardiovascular Disease

## 2019-11-17 ENCOUNTER — Encounter: Payer: Self-pay | Admitting: Cardiovascular Disease

## 2019-11-17 VITALS — BP 145/93 | HR 99 | Ht 62.5 in | Wt 225.6 lb

## 2019-11-17 DIAGNOSIS — R600 Localized edema: Secondary | ICD-10-CM | POA: Diagnosis not present

## 2019-11-17 DIAGNOSIS — R0789 Other chest pain: Secondary | ICD-10-CM

## 2019-11-17 DIAGNOSIS — Z8249 Family history of ischemic heart disease and other diseases of the circulatory system: Secondary | ICD-10-CM

## 2019-11-17 DIAGNOSIS — Z86711 Personal history of pulmonary embolism: Secondary | ICD-10-CM | POA: Diagnosis not present

## 2019-11-17 DIAGNOSIS — E7800 Pure hypercholesterolemia, unspecified: Secondary | ICD-10-CM

## 2019-11-17 NOTE — Assessment & Plan Note (Signed)
Father had stents at age 58

## 2019-11-17 NOTE — Assessment & Plan Note (Signed)
She was recently seen at the ER in [place] approxi-3 weeks ago.  She had a hamburger patty 11:00 at night and after that developed chest pain lasted all night.  Her enzymes were negative the next day in the ER.  She is thought to have GERD.  She had negative Myoview stress test performed 07/03/2016 prior to repairing a paraesophageal hernia.  I am going to get a coronary calcium score to her stratify.

## 2019-11-17 NOTE — Progress Notes (Signed)
11/17/2019 [place] Virgo   12/11/1960  [id number]  Primary Physician Wenda Low, MD Primary Cardiologist: Lorretta Harp MD Lupe Carney, [place]  HPI:  Samantha Maynard is a 59 y.o. severely overweight married Caucasian female mother of 2, grandmother of 3 grandchildren referred to me by Dr. Deforest Hoyles for cardiovascular valuation because of chest pain.  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  Her risk factors for ischemic heart disease are diabetes with hemoglobin A1c above 9 as well as family history of father who had stents in his mid 22s.  She is never had a heart attack or stroke.  She did have remote history of DVT and subsequent pulmonary emboli and has been on Xarelto since.  Approximately 3 weeks ago after eating hamburger at 11:00 at night she developed chest pain which lasted all night.  She was seen in the ER in [place] where her work-up was unrevealing including negative enzymes.  Etiology was thought to be GERD.  She did have a negative Myoview stress test performed 07/03/2016.   Current Meds  Medication Sig  . acetaminophen (TYLENOL) 325 MG tablet Take 325-650 mg by mouth every 6 (six) hours as needed for mild pain or moderate pain.   . clonazePAM (KLONOPIN) 1 MG tablet Take 1 mg by mouth at bedtime as needed (sleep).   . furosemide (LASIX) 20 MG tablet Take 20 mg by mouth daily.  Marland Kitchen JARDIANCE 25 MG TABS tablet Take 25 mg by mouth daily.  Marland Kitchen lisdexamfetamine (VYVANSE) 70 MG capsule Take 70 mg by mouth daily.  Marland Kitchen loratadine (CLARITIN) 10 MG tablet Take 10 mg by mouth daily.  . metFORMIN (GLUCOPHAGE) 850 MG tablet TAKE 1 TABLET BY MOUTH TWICE DAILY WITH MEALS FOR 90 DAYS  . Multiple Vitamin (MULTIVITAMIN WITH MINERALS) TABS tablet Take 1 tablet by mouth daily.  . NYSTATIN powder SMARTSIG:Packet(s) Topical Twice Daily PRN  . omeprazole (PRILOSEC) 20 MG capsule Take  1 capsule (20 mg total) by mouth daily. (Patient taking differently: Take 20 mg by mouth daily with supper. )  . ondansetron (ZOFRAN) 4 MG tablet Take 4 mg by mouth every 6 (six) hours as needed for nausea or vomiting.   . rivaroxaban (XARELTO) 20 MG TABS tablet Take 20 mg by mouth daily with supper.  . venlafaxine XR (EFFEXOR-XR) 150 MG 24 hr capsule Take 150 mg by mouth daily at 6 PM.   . venlafaxine XR (EFFEXOR-XR) 75 MG 24 hr capsule Take 75 mg by mouth daily at 6 PM.     No Known Allergies  Social History   Socioeconomic History  . Marital status: Married    Spouse name: Not on file  . Number of children: Not on file  . Years of education: Not on file  . Highest education level: Not on file  Occupational History  . Not on file  Tobacco Use  . Smoking status: Never Smoker  . Smokeless tobacco: Never Used  . Tobacco comment: never used tobacco  Vaping Use  . Vaping Use: Never used  Substance and Sexual Activity  . Alcohol use: Yes    Alcohol/week: 0.0 standard drinks    Comment: rare  . Drug use: No  . Sexual activity: Not on file  Other Topics Concern  . Not on file  Social History Narrative  . Not on file   Social Determinants  of Health   Financial Resource Strain:   . Difficulty of Paying Living Expenses:   Food Insecurity:   . Worried About Charity fundraiser in the Last Year:   . Arboriculturist in the Last Year:   Transportation Needs:   . Film/video editor (Medical):   Marland Kitchen Lack of Transportation (Non-Medical):   Physical Activity:   . Days of Exercise per Week:   . Minutes of Exercise per Session:   Stress:   . Feeling of Stress :   Social Connections:   . Frequency of Communication with Friends and Family:   . Frequency of Social Gatherings with Friends and Family:   . Attends Religious Services:   . Active Member of Clubs or Organizations:   . Attends Archivist Meetings:   Marland Kitchen Marital Status:   Intimate Partner Violence:   . Fear of  Current or Ex-Partner:   . Emotionally Abused:   Marland Kitchen Physically Abused:   . Sexually Abused:      Review of Systems: General: negative for chills, fever, night sweats or weight changes.  Cardiovascular: negative for chest pain, dyspnea on exertion, edema, orthopnea, palpitations, paroxysmal nocturnal dyspnea or shortness of breath Dermatological: negative for rash Respiratory: negative for cough or wheezing Urologic: negative for hematuria Abdominal: negative for nausea, vomiting, diarrhea, bright red blood per rectum, melena, or hematemesis Neurologic: negative for visual changes, syncope, or dizziness All other systems reviewed and are otherwise negative except as noted above.    Blood pressure (!) 145/93, pulse 99, height 5' 2.5" (1.588 m), weight 225 lb 9.6 oz (102.3 kg), last menstrual period 06/04/2014, SpO2 97 %.  General appearance: alert and no distress Neck: no adenopathy, no carotid bruit, no JVD, supple, symmetrical, trachea midline and thyroid not enlarged, symmetric, no tenderness/mass/nodules Lungs: clear to auscultation bilaterally Heart: regular rate and rhythm, S1, S2 normal, no murmur, click, rub or gallop Extremities: extremities normal, atraumatic, no cyanosis or edema Pulses: 2+ and symmetric Skin: Skin color, texture, turgor normal. No rashes or lesions Neurologic: Alert and oriented X 3, normal strength and tone. Normal symmetric reflexes. Normal coordination and gait  EKG sinus rhythm at 99 with nonspecific ST and T wave changes.  I personally reviewed this EKG.  ASSESSMENT AND PLAN:   History of pulmonary embolism Remote history of pulmonary emboli after stopping Coumadin for DVT.  She has been on Xarelto oral anticoagulation since that time.  Family history of early CAD Father had stents at age 54  Lower extremity edema History of bilateral lower extremity edema.  She wears compression stockings daily and when she is placed back on an oral diuretic.  We  will check a 2D echocardiogram.  Atypical chest pain She was recently seen at the ER in [place] approxi-3 weeks ago.  She had a hamburger patty 11:00 at night and after that developed chest pain lasted all night.  Her enzymes were negative the next day in the ER.  She is thought to have GERD.  She had negative Myoview stress test performed 07/03/2016 prior to repairing a paraesophageal hernia.  I am going to get a coronary calcium score to her stratify.      Lorretta Harp MD FACP,FACC,FAHA, [facility] 11/17/2019 2:27 PM

## 2019-11-17 NOTE — Assessment & Plan Note (Signed)
Remote history of pulmonary emboli after stopping Coumadin for DVT.  She has been on Xarelto oral anticoagulation since that time.

## 2019-11-17 NOTE — Assessment & Plan Note (Signed)
History of bilateral lower extremity edema.  She wears compression stockings daily and when she is placed back on an oral diuretic.  We will check a 2D echocardiogram.

## 2019-11-17 NOTE — Patient Instructions (Signed)
Medication Instructions:   NO CHANGE  *If you need a refill on your cardiac medications before your next appointment, please call your pharmacy*   Lab Work:  Your physician recommends that you return for lab work PRIOR TO EATING  If you have labs (blood work) drawn today and your tests are completely normal, you will receive your results only by: Marland Kitchen MyChart Message (if you have MyChart) OR . A paper copy in the mail If you have any lab test that is abnormal or we need to change your treatment, we will call you to review the results.   Testing/Procedures:  Your physician has requested that you have an echocardiogram. Echocardiography is a painless test that uses sound waves to create images of your heart. It provides your doctor with information about the size and shape of your heart and how well your heart's chambers and valves are working. This procedure takes approximately one hour. There are no restrictions for this procedure.[place]   Follow-Up: At [facility], you and your health needs are our priority.  As part of our continuing mission to provide you with exceptional heart care, we have created designated Provider Care Teams.  These Care Teams include your primary Cardiologist (physician) and Advanced Practice Providers (APPs -  Physician Assistants and Nurse Practitioners) who all work together to provide you with the care you need, when you need it.  We recommend signing up for the patient portal called "MyChart".  Sign up information is provided on this After Visit Summary.  MyChart is used to connect with patients for Virtual Visits (Telemedicine).  Patients are able to view lab/test results, encounter notes, upcoming appointments, etc.  Non-urgent messages can be sent to your provider as well.   To learn more about what you can do with MyChart, go to [email or web address].    Your next appointment:    6 month(s)  The format for your next appointment:   In Person  Provider:   You may see Quay Burow MD or one of the following Advanced Practice Providers on your designated Care Team:    Kerin Ransom, PA-C  [place], [place]  Coletta Memos, [place]

## 2019-12-01 DIAGNOSIS — E7800 Pure hypercholesterolemia, unspecified: Secondary | ICD-10-CM | POA: Diagnosis not present

## 2019-12-02 LAB — LIPID PANEL
Chol/HDL Ratio: 3.6 ratio (ref 0.0–4.4)
Cholesterol, Total: 206 mg/dL — ABNORMAL HIGH (ref 100–199)
HDL: 57 mg/dL (ref >39)
LDL Chol Calc (NIH): 134 mg/dL — ABNORMAL HIGH (ref 0–99)
Triglycerides: 83 mg/dL (ref 0–149)
VLDL Cholesterol Cal: 15 mg/dL (ref 5–40)

## 2019-12-02 LAB — HEPATIC FUNCTION PANEL
ALT: 36 IU/L — ABNORMAL HIGH (ref 0–32)
AST: 31 IU/L (ref 0–40)
Albumin: 4.4 g/dL (ref 3.8–4.9)
Alkaline Phosphatase: 194 IU/L — ABNORMAL HIGH (ref 48–121)
Bilirubin Total: 0.3 mg/dL (ref 0.0–1.2)
Bilirubin, Direct: 0.09 mg/dL (ref 0.00–0.40)
Total Protein: 7.5 g/dL (ref 6.0–8.5)

## 2019-12-10 ENCOUNTER — Ambulatory Visit (HOSPITAL_COMMUNITY): Payer: Federal, State, Local not specified - PPO | Attending: Cardiology

## 2019-12-10 ENCOUNTER — Other Ambulatory Visit: Payer: Self-pay

## 2019-12-10 ENCOUNTER — Ambulatory Visit (INDEPENDENT_AMBULATORY_CARE_PROVIDER_SITE_OTHER)
Admission: RE | Admit: 2019-12-10 | Discharge: 2019-12-10 | Disposition: A | Discharge status: Home / Self Care | Payer: Self-pay | Source: Ambulatory Visit | Attending: Cardiovascular Disease | Admitting: Cardiovascular Disease

## 2019-12-10 DIAGNOSIS — R0789 Other chest pain: Secondary | ICD-10-CM | POA: Insufficient documentation

## 2019-12-10 LAB — ECHOCARDIOGRAM COMPLETE
Area-P 1/2: 4.49 cm2
S' Lateral: 2.6 cm

## 2019-12-10 MED ORDER — PERFLUTREN LIPID MICROSPHERE
1.0000 mL | INTRAVENOUS | Status: AC | PRN
  Administered 2019-12-10: 2 mL via INTRAVENOUS

## 2019-12-17 ENCOUNTER — Other Ambulatory Visit: Payer: Self-pay

## 2019-12-17 DIAGNOSIS — E7800 Pure hypercholesterolemia, unspecified: Secondary | ICD-10-CM

## 2019-12-17 MED ORDER — EZETIMIBE 10 MG PO TABS
10.0000 mg | ORAL_TABLET | Freq: Every day | ORAL | 3 refills | Status: DC
Start: 2019-12-17 — End: 2021-02-13

## 2020-01-04 DIAGNOSIS — E1169 Type 2 diabetes mellitus with other specified complication: Secondary | ICD-10-CM | POA: Diagnosis not present

## 2020-01-05 ENCOUNTER — Inpatient Hospital Stay (HOSPITAL_BASED_OUTPATIENT_CLINIC_OR_DEPARTMENT_OTHER): Payer: Federal, State, Local not specified - PPO | Admitting: Family

## 2020-01-05 ENCOUNTER — Other Ambulatory Visit: Payer: Self-pay

## 2020-01-05 ENCOUNTER — Encounter: Payer: Self-pay | Admitting: Family

## 2020-01-05 ENCOUNTER — Inpatient Hospital Stay: Payer: Federal, State, Local not specified - PPO | Attending: Hematology & Oncology

## 2020-01-05 VITALS — BP 138/88 | HR 95 | Temp 98.1°F | Resp 18 | Ht 62.0 in | Wt 220.0 lb

## 2020-01-05 DIAGNOSIS — Z7901 Long term (current) use of anticoagulants: Secondary | ICD-10-CM | POA: Insufficient documentation

## 2020-01-05 DIAGNOSIS — Z79899 Other long term (current) drug therapy: Secondary | ICD-10-CM | POA: Diagnosis not present

## 2020-01-05 DIAGNOSIS — D509 Iron deficiency anemia, unspecified: Secondary | ICD-10-CM | POA: Insufficient documentation

## 2020-01-05 DIAGNOSIS — K909 Intestinal malabsorption, unspecified: Secondary | ICD-10-CM

## 2020-01-05 DIAGNOSIS — D500 Iron deficiency anemia secondary to blood loss (chronic): Secondary | ICD-10-CM

## 2020-01-05 DIAGNOSIS — Z86711 Personal history of pulmonary embolism: Secondary | ICD-10-CM | POA: Diagnosis not present

## 2020-01-05 LAB — CBC WITH DIFFERENTIAL (CANCER CENTER ONLY)
Abs Immature Granulocytes: 0.11 10*3/uL — ABNORMAL HIGH (ref 0.00–0.07)
Basophils Absolute: 0 10*3/uL (ref 0.0–0.1)
Basophils Relative: 0 %
Eosinophils Absolute: 0 10*3/uL (ref 0.0–0.5)
Eosinophils Relative: 0 %
HCT: 44.6 % (ref 36.0–46.0)
Hemoglobin: 14.3 g/dL (ref 12.0–15.0)
Immature Granulocytes: 2 %
Lymphocytes Relative: 23 %
Lymphs Abs: 1.6 10*3/uL (ref 0.7–4.0)
MCH: 26 pg (ref 26.0–34.0)
MCHC: 32.1 g/dL (ref 30.0–36.0)
MCV: 80.9 fL (ref 80.0–100.0)
Monocytes Absolute: 0.5 10*3/uL (ref 0.1–1.0)
Monocytes Relative: 6 %
Neutro Abs: 4.9 10*3/uL (ref 1.7–7.7)
Neutrophils Relative %: 69 %
Platelet Count: 343 10*3/uL (ref 150–400)
RBC: 5.51 MIL/uL — ABNORMAL HIGH (ref 3.87–5.11)
RDW: 14.3 % (ref 11.5–15.5)
WBC Count: 7 10*3/uL (ref 4.0–10.5)
nRBC: 0 % (ref 0.0–0.2)

## 2020-01-05 LAB — CMP (CANCER CENTER ONLY)
ALT: 17 U/L (ref 0–44)
AST: 15 U/L (ref 15–41)
Albumin: 4.4 g/dL (ref 3.5–5.0)
Alkaline Phosphatase: 146 U/L — ABNORMAL HIGH (ref 38–126)
Anion gap: 10 (ref 5–15)
BUN: 15 mg/dL (ref 6–20)
CO2: 28 mmol/L (ref 22–32)
Calcium: 9.7 mg/dL (ref 8.9–10.3)
Chloride: 100 mmol/L (ref 98–111)
Creatinine: 0.93 mg/dL (ref 0.44–1.00)
GFR, Est AFR Am: >60 mL/min (ref >60)
GFR, Estimated: >60 mL/min (ref >60)
Glucose, Bld: 141 mg/dL — ABNORMAL HIGH (ref 70–99)
Potassium: 3.7 mmol/L (ref 3.5–5.1)
Sodium: 138 mmol/L (ref 135–145)
Total Bilirubin: 0.3 mg/dL (ref 0.3–1.2)
Total Protein: 7.2 g/dL (ref 6.5–8.1)

## 2020-01-05 LAB — RETICULOCYTES
Immature Retic Fract: 20.3 % — ABNORMAL HIGH (ref 2.3–15.9)
RBC.: 5.5 MIL/uL — ABNORMAL HIGH (ref 3.87–5.11)
Retic Count, Absolute: 104 10*3/uL (ref 19.0–186.0)
Retic Ct Pct: 1.9 % (ref 0.4–3.1)

## 2020-01-05 NOTE — Progress Notes (Signed)
Hematology and Oncology Follow Up Visit  Samantha Maynard [id number] 01-06-1961 59 y.o. 01/05/2020   Principle Diagnosis:  Iron deficiency anemia Recurrent pulmonary emboli  Current Therapy:         IV iron as indicated Xarelto 20 mg PO daily - [facility] managed by Dr. Deforest Hoyles   Interim History:  Samantha Maynard is here today for follow-up. She is doing well but notes lingering fatigue. She has spoken with her PCP and will be scheduling a sleep study. She feels that she has sleep apnea.  She has not noted any episodes of blood loss on Xarelto. No bruising or petechiae.  No fever, chills, n/v, cough, rash, dizziness, SOB, chest pain, palpitations, abdominal pain or changes in bowel or bladder habits.  She has chronic swelling in the left ankle due to history of cellulitis. No numbness or tingling in her extremities.  No c/o pain.  She has maintained a good appetite and is staying well hydrated. Her weight is stable.   ECOG Performance Status: 1 - Symptomatic but completely ambulatory  Medications:  Allergies as of 01/05/2020   No Known Allergies     Medication List       Accurate as of January 05, 2020  2:50 PM. If you have any questions, ask your nurse or doctor.        acetaminophen 325 MG tablet Commonly known as: TYLENOL Take 325-650 mg by mouth every 6 (six) hours as needed for mild pain or moderate pain.   clonazePAM 1 MG tablet Commonly known as: KLONOPIN Take 1 mg by mouth at bedtime as needed (sleep).   ezetimibe 10 MG tablet Commonly known as: ZETIA Take 1 tablet (10 mg total) by mouth daily.   furosemide 20 MG tablet Commonly known as: LASIX Take 20 mg by mouth daily.   Jardiance 25 MG Tabs tablet Generic drug: empagliflozin Take 25 mg by mouth daily.   lisdexamfetamine 70 MG capsule Commonly known as: VYVANSE Take 70 mg by mouth daily.   loratadine 10 MG tablet Commonly known as: CLARITIN Take 10 mg by mouth daily.   metFORMIN 850 MG  tablet Commonly known as: GLUCOPHAGE TAKE 1 TABLET BY MOUTH TWICE DAILY WITH MEALS FOR 90 DAYS   multivitamin with minerals Tabs tablet Take 1 tablet by mouth daily.   nystatin powder Generic drug: nystatin SMARTSIG:Packet(s) Topical Twice Daily PRN   omeprazole 20 MG capsule Commonly known as: PRILOSEC Take 1 capsule (20 mg total) by mouth daily. What changed: when to take this   ondansetron 4 MG tablet Commonly known as: ZOFRAN Take 4 mg by mouth every 6 (six) hours as needed for nausea or vomiting.   venlafaxine XR 150 MG 24 hr capsule Commonly known as: EFFEXOR-XR Take 150 mg by mouth daily at 6 PM.   venlafaxine XR 75 MG 24 hr capsule Commonly known as: EFFEXOR-XR Take 75 mg by mouth daily at 6 PM.   Xarelto 20 MG Tabs tablet Generic drug: rivaroxaban Take 20 mg by mouth daily with supper.       Allergies: No Known Allergies  Past Medical History, Surgical history, Social history, and Family History were reviewed and updated.  Review of Systems: All other 10 point review of systems is negative.   Physical Exam:  height is 5\' 2"  (1.575 m) and weight is 220 lb (99.8 kg). Her oral temperature is 98.1 F (36.7 C). Her blood pressure is 138/88 and her pulse is 95. Her respiration is 18 and oxygen saturation  is 100%.   Wt Readings from Last 3 Encounters:  01/05/20 220 lb (99.8 kg)  11/17/19 225 lb 9.6 oz (102.3 kg)  01/05/19 226 lb (102.5 kg)    Ocular: Sclerae unicteric, pupils equal, round and reactive to light Ear-nose-throat: Oropharynx clear, dentition fair Lymphatic: No cervical or supraclavicular adenopathy Lungs no rales or rhonchi, good excursion bilaterally Heart regular rate and rhythm, no murmur appreciated Abd soft, nontender, positive bowel sounds, no liver or spleen tip palpated on exam, no fluid wave  MSK no focal spinal tenderness, no joint edema Neuro: non-focal, well-oriented, appropriate affect Breasts: Deferred   Lab Results   Component Value Date   WBC 7.0 01/05/2020   HGB 14.3 01/05/2020   HCT 44.6 01/05/2020   MCV 80.9 01/05/2020   PLT 343 01/05/2020   Lab Results  Component Value Date   FERRITIN 5 (L) 01/05/2019   IRON 32 (L) 01/05/2019   TIBC 395 01/05/2019   UIBC 363 01/05/2019   IRONPCTSAT 8 (L) 01/05/2019   Lab Results  Component Value Date   RETICCTPCT 1.9 01/05/2020   RBC 5.51 (H) 01/05/2020   RBC 5.50 (H) 01/05/2020   RETICCTABS 60.6 03/25/2014   No results found for: KPAFRELGTCHN, LAMBDASER, KAPLAMBRATIO No results found for: IGGSERUM, IGA, IGMSERUM No results found for: Odetta Pink, SPEI   Chemistry      Component Value Date/Time   NA 138 01/05/2020 1357   NA 138 03/28/2017 1312   NA 142 09/20/2016 1131   NA 139 12/02/2015 1104   K 3.7 01/05/2020 1357   K 4.3 03/28/2017 1312   K 3.9 09/20/2016 1131   K 4.1 12/02/2015 1104   CL 100 01/05/2020 1357   CL 100 03/28/2017 1312   CL 102 09/20/2016 1131   CO2 28 01/05/2020 1357   CO2 27 03/28/2017 1312   CO2 27 09/20/2016 1131   CO2 25 12/02/2015 1104   BUN 15 01/05/2020 1357   BUN 10 03/28/2017 1312   BUN 10 09/20/2016 1131   BUN 17.5 12/02/2015 1104   CREATININE 0.93 01/05/2020 1357   CREATININE 0.80 03/28/2017 1312   CREATININE 0.8 09/20/2016 1131   CREATININE 0.9 12/02/2015 1104      Component Value Date/Time   CALCIUM 9.7 01/05/2020 1357   CALCIUM 9.8 03/28/2017 1312   CALCIUM 9.2 09/20/2016 1131   CALCIUM 9.3 12/02/2015 1104   ALKPHOS 146 (H) 01/05/2020 1357   ALKPHOS 143 (H) 03/28/2017 1312   ALKPHOS 151 (H) 09/20/2016 1131   ALKPHOS 135 12/02/2015 1104   AST 15 01/05/2020 1357   AST 19 12/02/2015 1104   ALT 17 01/05/2020 1357   ALT 24 09/20/2016 1131   ALT 23 12/02/2015 1104   BILITOT 0.3 01/05/2020 1357   BILITOT <0.30 12/02/2015 1104      Impression and Plan: Samantha Maynard is a very pleasant 59 yo caucasian female with history of recurrent pulmonary  emboli on lifelong anticoagulation with Xarelto managed by her PCP Dr. Deforest Hoyles.  We will see how her iron is looking and replace if needed.  We will plan to see her again in naother year.  She will contact our office with any questions or concerns. We can certainly see her sooner if needed.   Laverna Peace, NP 8/17/20212:50 PM

## 2020-01-06 ENCOUNTER — Telehealth: Payer: Self-pay | Admitting: Family

## 2020-01-06 LAB — IRON AND TIBC
Iron: 38 ug/dL — ABNORMAL LOW (ref 41–142)
Saturation Ratios: 10 % — ABNORMAL LOW (ref 21–57)
TIBC: 373 ug/dL (ref 236–444)
UIBC: 335 ug/dL (ref 120–384)

## 2020-01-06 LAB — FERRITIN: Ferritin: 14 ng/mL (ref 11–307)

## 2020-01-06 NOTE — Telephone Encounter (Signed)
lmom for patient to return call to office to confirm iron appts 8/27 and 9/3 at 2 pm per 8/18 sch msg

## 2020-01-15 ENCOUNTER — Other Ambulatory Visit: Payer: Self-pay

## 2020-01-15 ENCOUNTER — Inpatient Hospital Stay: Payer: Federal, State, Local not specified - PPO

## 2020-01-15 VITALS — BP 120/78 | HR 88 | Temp 97.7°F | Resp 17

## 2020-01-15 DIAGNOSIS — K909 Intestinal malabsorption, unspecified: Secondary | ICD-10-CM

## 2020-01-15 DIAGNOSIS — D509 Iron deficiency anemia, unspecified: Secondary | ICD-10-CM | POA: Diagnosis not present

## 2020-01-15 DIAGNOSIS — Z79899 Other long term (current) drug therapy: Secondary | ICD-10-CM | POA: Diagnosis not present

## 2020-01-15 DIAGNOSIS — Z86711 Personal history of pulmonary embolism: Secondary | ICD-10-CM | POA: Diagnosis not present

## 2020-01-15 DIAGNOSIS — Z7901 Long term (current) use of anticoagulants: Secondary | ICD-10-CM | POA: Diagnosis not present

## 2020-01-15 MED ORDER — SODIUM CHLORIDE 0.9 % IV SOLN
510.0000 mg | Freq: Once | INTRAVENOUS | Status: AC
  Administered 2020-01-15: 510 mg via INTRAVENOUS
  Filled 2020-01-15: qty 17

## 2020-01-15 MED ORDER — SODIUM CHLORIDE 0.9 % IV SOLN
INTRAVENOUS | Status: DC
  Filled 2020-01-15: qty 250

## 2020-01-15 NOTE — Patient Instructions (Signed)

## 2020-01-18 DIAGNOSIS — G479 Sleep disorder, unspecified: Secondary | ICD-10-CM | POA: Diagnosis not present

## 2020-01-18 DIAGNOSIS — Z6837 Body mass index (BMI) 37.0-37.9, adult: Secondary | ICD-10-CM | POA: Diagnosis not present

## 2020-01-18 DIAGNOSIS — R0683 Snoring: Secondary | ICD-10-CM | POA: Diagnosis not present

## 2020-01-22 ENCOUNTER — Other Ambulatory Visit: Payer: Self-pay

## 2020-01-22 ENCOUNTER — Inpatient Hospital Stay: Payer: Federal, State, Local not specified - PPO | Attending: Hematology & Oncology

## 2020-01-22 VITALS — BP 133/86 | HR 96 | Temp 98.3°F | Resp 18

## 2020-01-22 DIAGNOSIS — D509 Iron deficiency anemia, unspecified: Secondary | ICD-10-CM | POA: Insufficient documentation

## 2020-01-22 DIAGNOSIS — K909 Intestinal malabsorption, unspecified: Secondary | ICD-10-CM

## 2020-01-22 MED ORDER — SODIUM CHLORIDE 0.9 % IV SOLN
510.0000 mg | Freq: Once | INTRAVENOUS | Status: AC
  Administered 2020-01-22: 510 mg via INTRAVENOUS
  Filled 2020-01-22: qty 510

## 2020-01-22 MED ORDER — SODIUM CHLORIDE 0.9 % IV SOLN
INTRAVENOUS | Status: DC
  Filled 2020-01-22: qty 250

## 2020-01-22 NOTE — Progress Notes (Signed)
Pt. Refused to wait 30 minutes post infusion. Discharged stable and ASX.

## 2020-01-22 NOTE — Patient Instructions (Signed)

## 2020-01-26 DIAGNOSIS — F322 Major depressive disorder, single episode, severe without psychotic features: Secondary | ICD-10-CM | POA: Diagnosis not present

## 2020-02-10 DIAGNOSIS — G4733 Obstructive sleep apnea (adult) (pediatric): Secondary | ICD-10-CM | POA: Diagnosis not present

## 2020-03-16 DIAGNOSIS — G4733 Obstructive sleep apnea (adult) (pediatric): Secondary | ICD-10-CM | POA: Diagnosis not present

## 2020-04-16 DIAGNOSIS — G4733 Obstructive sleep apnea (adult) (pediatric): Secondary | ICD-10-CM | POA: Diagnosis not present

## 2020-04-19 DIAGNOSIS — F322 Major depressive disorder, single episode, severe without psychotic features: Secondary | ICD-10-CM | POA: Diagnosis not present

## 2020-05-04 DIAGNOSIS — F900 Attention-deficit hyperactivity disorder, predominantly inattentive type: Secondary | ICD-10-CM | POA: Diagnosis not present

## 2020-05-04 DIAGNOSIS — Z13220 Encounter for screening for lipoid disorders: Secondary | ICD-10-CM | POA: Diagnosis not present

## 2020-05-04 DIAGNOSIS — Z0000 Encounter for general adult medical examination without abnormal findings: Secondary | ICD-10-CM | POA: Diagnosis not present

## 2020-05-04 DIAGNOSIS — I82402 Acute embolism and thrombosis of unspecified deep veins of left lower extremity: Secondary | ICD-10-CM | POA: Diagnosis not present

## 2020-05-04 DIAGNOSIS — G4733 Obstructive sleep apnea (adult) (pediatric): Secondary | ICD-10-CM | POA: Diagnosis not present

## 2020-05-04 DIAGNOSIS — E1169 Type 2 diabetes mellitus with other specified complication: Secondary | ICD-10-CM | POA: Diagnosis not present

## 2020-05-04 DIAGNOSIS — Z23 Encounter for immunization: Secondary | ICD-10-CM | POA: Diagnosis not present

## 2020-05-04 DIAGNOSIS — I2699 Other pulmonary embolism without acute cor pulmonale: Secondary | ICD-10-CM | POA: Diagnosis not present

## 2020-05-06 ENCOUNTER — Ambulatory Visit: Payer: Federal, State, Local not specified - PPO | Admitting: Cardiovascular Disease

## 2020-05-16 DIAGNOSIS — G4733 Obstructive sleep apnea (adult) (pediatric): Secondary | ICD-10-CM | POA: Diagnosis not present

## 2020-06-14 ENCOUNTER — Ambulatory Visit: Payer: Federal, State, Local not specified - PPO | Admitting: Cardiovascular Disease

## 2020-06-14 ENCOUNTER — Other Ambulatory Visit: Payer: Self-pay

## 2020-06-14 ENCOUNTER — Encounter: Payer: Self-pay | Admitting: Cardiovascular Disease

## 2020-06-14 DIAGNOSIS — R0789 Other chest pain: Secondary | ICD-10-CM

## 2020-06-14 DIAGNOSIS — Z86711 Personal history of pulmonary embolism: Secondary | ICD-10-CM

## 2020-06-14 DIAGNOSIS — E782 Mixed hyperlipidemia: Secondary | ICD-10-CM

## 2020-06-14 DIAGNOSIS — E785 Hyperlipidemia, unspecified: Secondary | ICD-10-CM | POA: Insufficient documentation

## 2020-06-14 NOTE — Assessment & Plan Note (Signed)
History of hyperlipidemia on Zetia with lipid profile performed 05/04/2020 revealing a total cholesterol 171, LDL of 91 and HDL of 63.

## 2020-06-14 NOTE — Progress Notes (Signed)
06/14/2020 Samantha Maynard   05-17-1961  [id number]  Primary Physician Wenda Low, MD Primary Cardiologist: Lorretta Harp MD Lupe Carney, [place]  HPI:  Samantha Maynard is a 60 y.o.  severely overweight married Caucasian female mother of 2, grandmother of 3 grandchildren referred to me by Dr. Deforest Hoyles for cardiovascular valuation because of chest pain.  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    I last saw her in the office 11/17/2019.  Her risk factors for ischemic heart disease are diabetes with hemoglobin A1c above 9 as well as family history of father who had stents in his mid 45s.  She is never had a heart attack or stroke.  She did have remote history of DVT and subsequent pulmonary emboli and has been on Xarelto since.  Approximately 3 weeks ago after eating hamburger at 11:00 at night she developed chest pain which lasted all night.  She was seen in the ER in [place] where her work-up was unrevealing including negative enzymes.  Etiology was thought to be GERD.  She did have a negative Myoview stress test performed 07/03/2016.  Since I saw her 6 months ago she did have a coronary calcium score performed 12/10/2019 which was 0.  She said no recurrent chest pain.   No outpatient medications have been marked as taking for the 06/14/20 encounter (Office Visit) with Lorretta Harp, MD.     No Known Allergies  Social History   Socioeconomic History  . Marital status: Married    Spouse name: Not on file  . Number of children: Not on file  . Years of education: Not on file  . Highest education level: Not on file  Occupational History  . Not on file  Tobacco Use  . Smoking status: Never Smoker  . Smokeless tobacco: Never Used  . Tobacco comment: never used tobacco  Vaping Use  . Vaping Use: Never used  Substance and Sexual Activity  . Alcohol use: Yes     Alcohol/week: 0.0 standard drinks    Comment: rare  . Drug use: No  . Sexual activity: Not on file  Other Topics Concern  . Not on file  Social History Narrative  . Not on file   Social Determinants of Health   Financial Resource Strain: Not on file  Food Insecurity: Not on file  Transportation Needs: Not on file  Physical Activity: Not on file  Stress: Not on file  Social Connections: Not on file  Intimate Partner Violence: Not on file     Review of Systems: General: negative for chills, fever, night sweats or weight changes.  Cardiovascular: negative for chest pain, dyspnea on exertion, edema, orthopnea, palpitations, paroxysmal nocturnal dyspnea or shortness of breath Dermatological: negative for rash Respiratory: negative for cough or wheezing Urologic: negative for hematuria Abdominal: negative for nausea, vomiting, diarrhea, bright red blood per rectum, melena, or hematemesis Neurologic: negative for visual changes, syncope, or dizziness All other systems reviewed and are otherwise negative except as noted above.    Blood pressure 136/88, pulse 96, height 5\' 3"  (1.6 m), weight 220 lb (99.8 kg), last menstrual period 06/04/2014.  General appearance: alert and no distress Neck: no adenopathy, no carotid bruit, no JVD, supple, symmetrical, trachea midline and thyroid not enlarged, symmetric, no tenderness/mass/nodules Lungs: clear to auscultation bilaterally Heart: regular rate and rhythm, S1, S2 normal,  no murmur, click, rub or gallop Extremities: extremities normal, atraumatic, no cyanosis or edema Pulses: 2+ and symmetric Skin: Skin color, texture, turgor normal. No rashes or lesions Neurologic: Alert and oriented X 3, normal strength and tone. Normal symmetric reflexes. Normal coordination and gait  EKG sinus rhythm at 96 without ST or T wave changes.  I personally reviewed this EKG  ASSESSMENT AND PLAN:   Atypical chest pain History of atypical chest pain with  negative Myoview stress test performed 06/23/2016 and a coronary calcium score of 0 performed 12/10/2019.  She has had no recurrent chest pain since I saw her 6 months ago.  History of pulmonary embolism History of pulmonary emboli remotely on Xarelto oral anticoagulation.  Hyperlipidemia History of hyperlipidemia on Zetia with lipid profile performed 05/04/2020 revealing a total cholesterol 171, LDL of 91 and HDL of 63.      Lorretta Harp MD FACP,FACC,FAHA, [facility] 06/14/2020 11:51 AM

## 2020-06-14 NOTE — Assessment & Plan Note (Signed)
History of pulmonary emboli remotely on Xarelto oral anticoagulation.

## 2020-06-14 NOTE — Patient Instructions (Signed)

## 2020-06-14 NOTE — Assessment & Plan Note (Signed)
History of atypical chest pain with negative Myoview stress test performed 06/23/2016 and a coronary calcium score of 0 performed 12/10/2019.  She has had no recurrent chest pain since I saw her 6 months ago.

## 2020-06-16 DIAGNOSIS — N390 Urinary tract infection, site not specified: Secondary | ICD-10-CM | POA: Diagnosis not present

## 2020-06-16 DIAGNOSIS — G4733 Obstructive sleep apnea (adult) (pediatric): Secondary | ICD-10-CM | POA: Diagnosis not present

## 2020-06-16 DIAGNOSIS — R300 Dysuria: Secondary | ICD-10-CM | POA: Diagnosis not present

## 2020-07-13 DIAGNOSIS — F322 Major depressive disorder, single episode, severe without psychotic features: Secondary | ICD-10-CM | POA: Diagnosis not present

## 2020-09-13 ENCOUNTER — Other Ambulatory Visit: Payer: Self-pay | Admitting: Internal Medicine

## 2020-09-13 DIAGNOSIS — Z1231 Encounter for screening mammogram for malignant neoplasm of breast: Secondary | ICD-10-CM

## 2020-10-04 DIAGNOSIS — F322 Major depressive disorder, single episode, severe without psychotic features: Secondary | ICD-10-CM | POA: Diagnosis not present

## 2020-10-10 DIAGNOSIS — Z1151 Encounter for screening for human papillomavirus (HPV): Secondary | ICD-10-CM | POA: Diagnosis not present

## 2020-10-10 DIAGNOSIS — N904 Leukoplakia of vulva: Secondary | ICD-10-CM | POA: Diagnosis not present

## 2020-10-10 DIAGNOSIS — Z86711 Personal history of pulmonary embolism: Secondary | ICD-10-CM | POA: Diagnosis not present

## 2020-10-10 DIAGNOSIS — Z01419 Encounter for gynecological examination (general) (routine) without abnormal findings: Secondary | ICD-10-CM | POA: Diagnosis not present

## 2020-10-10 DIAGNOSIS — B372 Candidiasis of skin and nail: Secondary | ICD-10-CM | POA: Diagnosis not present

## 2020-10-25 DIAGNOSIS — G4733 Obstructive sleep apnea (adult) (pediatric): Secondary | ICD-10-CM | POA: Diagnosis not present

## 2020-10-25 DIAGNOSIS — F900 Attention-deficit hyperactivity disorder, predominantly inattentive type: Secondary | ICD-10-CM | POA: Diagnosis not present

## 2020-10-25 DIAGNOSIS — E1169 Type 2 diabetes mellitus with other specified complication: Secondary | ICD-10-CM | POA: Diagnosis not present

## 2020-11-02 ENCOUNTER — Other Ambulatory Visit: Payer: Self-pay

## 2020-11-02 ENCOUNTER — Ambulatory Visit
Admission: RE | Admit: 2020-11-02 | Discharge: 2020-11-02 | Disposition: A | Discharge status: Home / Self Care | Payer: Federal, State, Local not specified - PPO | Source: Ambulatory Visit | Attending: Internal Medicine | Admitting: Internal Medicine

## 2020-11-02 DIAGNOSIS — Z1231 Encounter for screening mammogram for malignant neoplasm of breast: Secondary | ICD-10-CM | POA: Diagnosis not present

## 2020-12-05 DIAGNOSIS — G4733 Obstructive sleep apnea (adult) (pediatric): Secondary | ICD-10-CM | POA: Diagnosis not present

## 2020-12-19 DIAGNOSIS — E119 Type 2 diabetes mellitus without complications: Secondary | ICD-10-CM | POA: Diagnosis not present

## 2020-12-19 DIAGNOSIS — H25813 Combined forms of age-related cataract, bilateral: Secondary | ICD-10-CM | POA: Diagnosis not present

## 2020-12-27 DIAGNOSIS — F322 Major depressive disorder, single episode, severe without psychotic features: Secondary | ICD-10-CM | POA: Diagnosis not present

## 2021-01-05 DIAGNOSIS — K080 Exfoliation of teeth due to systemic causes: Secondary | ICD-10-CM | POA: Diagnosis not present

## 2021-01-06 ENCOUNTER — Telehealth: Payer: Self-pay | Admitting: *Deleted

## 2021-01-06 NOTE — Telephone Encounter (Signed)
Called and lvm about rescheduling appointment from 02/03/21 to 02/14/21 - requested callback to confirm

## 2021-01-13 ENCOUNTER — Other Ambulatory Visit: Payer: Self-pay | Admitting: Cardiovascular Disease

## 2021-02-03 ENCOUNTER — Inpatient Hospital Stay: Payer: Federal, State, Local not specified - PPO | Admitting: Family

## 2021-02-03 ENCOUNTER — Inpatient Hospital Stay: Payer: Federal, State, Local not specified - PPO

## 2021-02-13 ENCOUNTER — Other Ambulatory Visit: Payer: Self-pay | Admitting: Family

## 2021-02-13 ENCOUNTER — Other Ambulatory Visit: Payer: Self-pay | Admitting: Cardiovascular Disease

## 2021-02-13 DIAGNOSIS — D509 Iron deficiency anemia, unspecified: Secondary | ICD-10-CM

## 2021-02-13 DIAGNOSIS — K909 Intestinal malabsorption, unspecified: Secondary | ICD-10-CM

## 2021-02-14 ENCOUNTER — Inpatient Hospital Stay: Payer: Federal, State, Local not specified - PPO | Admitting: Family

## 2021-02-14 ENCOUNTER — Encounter: Payer: Self-pay | Admitting: Family

## 2021-02-14 ENCOUNTER — Other Ambulatory Visit: Payer: Self-pay

## 2021-02-14 ENCOUNTER — Inpatient Hospital Stay: Payer: Federal, State, Local not specified - PPO | Attending: Hematology & Oncology

## 2021-02-14 VITALS — BP 137/95 | HR 90 | Temp 98.8°F | Resp 18 | Ht 63.0 in | Wt 215.1 lb

## 2021-02-14 DIAGNOSIS — D509 Iron deficiency anemia, unspecified: Secondary | ICD-10-CM

## 2021-02-14 DIAGNOSIS — Z7901 Long term (current) use of anticoagulants: Secondary | ICD-10-CM | POA: Diagnosis not present

## 2021-02-14 DIAGNOSIS — R5383 Other fatigue: Secondary | ICD-10-CM | POA: Diagnosis not present

## 2021-02-14 DIAGNOSIS — K909 Intestinal malabsorption, unspecified: Secondary | ICD-10-CM

## 2021-02-14 DIAGNOSIS — Z86711 Personal history of pulmonary embolism: Secondary | ICD-10-CM | POA: Insufficient documentation

## 2021-02-14 DIAGNOSIS — Z79899 Other long term (current) drug therapy: Secondary | ICD-10-CM | POA: Insufficient documentation

## 2021-02-14 LAB — CMP (CANCER CENTER ONLY)
ALT: 12 U/L (ref 0–44)
AST: 13 U/L — ABNORMAL LOW (ref 15–41)
Albumin: 4.3 g/dL (ref 3.5–5.0)
Alkaline Phosphatase: 128 U/L — ABNORMAL HIGH (ref 38–126)
Anion gap: 12 (ref 5–15)
BUN: 14 mg/dL (ref 6–20)
CO2: 26 mmol/L (ref 22–32)
Calcium: 9.6 mg/dL (ref 8.9–10.3)
Chloride: 99 mmol/L (ref 98–111)
Creatinine: 0.96 mg/dL (ref 0.44–1.00)
GFR, Estimated: >60 mL/min (ref >60)
Glucose, Bld: 156 mg/dL — ABNORMAL HIGH (ref 70–99)
Potassium: 4 mmol/L (ref 3.5–5.1)
Sodium: 137 mmol/L (ref 135–145)
Total Bilirubin: 0.4 mg/dL (ref 0.3–1.2)
Total Protein: 7.5 g/dL (ref 6.5–8.1)

## 2021-02-14 LAB — CBC WITH DIFFERENTIAL (CANCER CENTER ONLY)
Abs Immature Granulocytes: 0.13 10*3/uL — ABNORMAL HIGH (ref 0.00–0.07)
Basophils Absolute: 0 10*3/uL (ref 0.0–0.1)
Basophils Relative: 0 %
Eosinophils Absolute: 0 10*3/uL (ref 0.0–0.5)
Eosinophils Relative: 0 %
HCT: 49.6 % — ABNORMAL HIGH (ref 36.0–46.0)
Hemoglobin: 16.6 g/dL — ABNORMAL HIGH (ref 12.0–15.0)
Immature Granulocytes: 2 %
Lymphocytes Relative: 24 %
Lymphs Abs: 1.6 10*3/uL (ref 0.7–4.0)
MCH: 28.6 pg (ref 26.0–34.0)
MCHC: 33.5 g/dL (ref 30.0–36.0)
MCV: 85.4 fL (ref 80.0–100.0)
Monocytes Absolute: 0.6 10*3/uL (ref 0.1–1.0)
Monocytes Relative: 9 %
Neutro Abs: 4.6 10*3/uL (ref 1.7–7.7)
Neutrophils Relative %: 65 %
Platelet Count: 275 10*3/uL (ref 150–400)
RBC: 5.81 MIL/uL — ABNORMAL HIGH (ref 3.87–5.11)
RDW: 13.2 % (ref 11.5–15.5)
WBC Count: 6.9 10*3/uL (ref 4.0–10.5)
nRBC: 0 % (ref 0.0–0.2)

## 2021-02-14 LAB — IRON AND TIBC
Iron: 76 ug/dL (ref 41–142)
Saturation Ratios: 22 % (ref 21–57)
TIBC: 345 ug/dL (ref 236–444)
UIBC: 269 ug/dL (ref 120–384)

## 2021-02-14 LAB — RETICULOCYTES
Immature Retic Fract: 13.6 % (ref 2.3–15.9)
RBC.: 5.94 MIL/uL — ABNORMAL HIGH (ref 3.87–5.11)
Retic Count, Absolute: 108.7 10*3/uL (ref 19.0–186.0)
Retic Ct Pct: 1.8 % (ref 0.4–3.1)

## 2021-02-14 LAB — FERRITIN: Ferritin: 21 ng/mL (ref 11–307)

## 2021-02-14 NOTE — Progress Notes (Signed)
Hematology and Oncology Follow Up Visit  Samantha Maynard [id number] 1960/12/29 60 y.o. 02/14/2021   Principle Diagnosis:  Iron deficiency anemia Recurrent pulmonary emboli   Current Therapy:         IV iron as indicated Xarelto 20 mg PO daily - [facility], managed by Dr. Deforest Hoyles   Interim History:  Samantha Maynard is here today for follow-up. She is doing well but notes some fatigue today. Her little dog had her up at 3 am this morning.  She has not noted any episodes of blood loss. No bruising or petechiae.  Hgb is 16.6, MCV 85, platelets 275 and WBC count 6.9. Iron studies pending.  No fever, chills, n/v, cough, rash, dizziness, SOB, chest pain, palpitations, abdominal pain or changes in bowel or bladder habits.  No swelling, tenderness, numbness or tingling in her extremities at this time. She has occasional puffiness in the left ankle when standing for a long period of time. This resolves once she props up her foot.  No falls or syncope.  She has maintained a good appetite and is staying hydrated throughout the day.  Her weight is stable at 215 lbs.   ECOG Performance Status: 1 - Symptomatic but completely ambulatory  Medications:  Allergies as of 02/14/2021   No Known Allergies      Medication List        Accurate as of February 14, 2021 10:23 AM. If you have any questions, ask your nurse or doctor.          acetaminophen 325 MG tablet Commonly known as: TYLENOL Take 325-650 mg by mouth every 6 (six) hours as needed for mild pain or moderate pain.   clonazePAM 1 MG tablet Commonly known as: KLONOPIN Take 1 mg by mouth at bedtime as needed (sleep).   ezetimibe 10 MG tablet Commonly known as: ZETIA Take 1 tablet by mouth once daily   furosemide 20 MG tablet Commonly known as: LASIX Take 20 mg by mouth daily.   Jardiance 25 MG Tabs tablet Generic drug: empagliflozin Take 25 mg by mouth daily.   lisdexamfetamine 70 MG capsule Commonly known as: VYVANSE Take  70 mg by mouth daily.   loratadine 10 MG tablet Commonly known as: CLARITIN Take 10 mg by mouth daily.   metFORMIN 850 MG tablet Commonly known as: GLUCOPHAGE TAKE 1 TABLET BY MOUTH TWICE DAILY WITH MEALS FOR 90 DAYS   multivitamin with minerals Tabs tablet Take 1 tablet by mouth daily.   nystatin powder Generic drug: nystatin SMARTSIG:Packet(s) Topical Twice Daily PRN   omeprazole 20 MG capsule Commonly known as: PRILOSEC Take 1 capsule (20 mg total) by mouth daily. What changed: when to take this   ondansetron 4 MG tablet Commonly known as: ZOFRAN Take 4 mg by mouth every 6 (six) hours as needed for nausea or vomiting.   venlafaxine XR 150 MG 24 hr capsule Commonly known as: EFFEXOR-XR Take 150 mg by mouth daily at 6 PM.   venlafaxine XR 75 MG 24 hr capsule Commonly known as: EFFEXOR-XR Take 75 mg by mouth daily at 6 PM.   Xarelto 20 MG Tabs tablet Generic drug: rivaroxaban Take 20 mg by mouth daily with supper.        Allergies: No Known Allergies  Past Medical History, Surgical history, Social history, and Family History were reviewed and updated.  Review of Systems: All other 10 point review of systems is negative.   Physical Exam:  vitals were not taken for this visit.  Wt Readings from Last 3 Encounters:  06/14/20 220 lb (99.8 kg)  01/05/20 220 lb (99.8 kg)  11/17/19 225 lb 9.6 oz (102.3 kg)    Ocular: Sclerae unicteric, pupils equal, round and reactive to light Ear-nose-throat: Oropharynx clear, dentition fair Lymphatic: No cervical or supraclavicular adenopathy Lungs no rales or rhonchi, good excursion bilaterally Heart regular rate and rhythm, no murmur appreciated Abd soft, nontender, positive bowel sounds MSK no focal spinal tenderness, no joint edema Neuro: non-focal, well-oriented, appropriate affect Breasts: Deferred   Lab Results  Component Value Date   WBC 6.9 02/14/2021   HGB 16.6 (H) 02/14/2021   HCT 49.6 (H) 02/14/2021    MCV 85.4 02/14/2021   PLT 275 02/14/2021   Lab Results  Component Value Date   FERRITIN 14 01/05/2020   IRON 38 (L) 01/05/2020   TIBC 373 01/05/2020   UIBC 335 01/05/2020   IRONPCTSAT 10 (L) 01/05/2020   Lab Results  Component Value Date   RETICCTPCT 1.8 02/14/2021   RBC 5.94 (H) 02/14/2021   RBC 5.81 (H) 02/14/2021   RETICCTABS 60.6 03/25/2014   No results found for: KPAFRELGTCHN, LAMBDASER, KAPLAMBRATIO No results found for: IGGSERUM, IGA, IGMSERUM No results found for: Odetta Pink, SPEI   Chemistry      Component Value Date/Time   NA 138 01/05/2020 1357   NA 138 03/28/2017 1312   NA 142 09/20/2016 1131   NA 139 12/02/2015 1104   K 3.7 01/05/2020 1357   K 4.3 03/28/2017 1312   K 3.9 09/20/2016 1131   K 4.1 12/02/2015 1104   CL 100 01/05/2020 1357   CL 100 03/28/2017 1312   CL 102 09/20/2016 1131   CO2 28 01/05/2020 1357   CO2 27 03/28/2017 1312   CO2 27 09/20/2016 1131   CO2 25 12/02/2015 1104   BUN 15 01/05/2020 1357   BUN 10 03/28/2017 1312   BUN 10 09/20/2016 1131   BUN 17.5 12/02/2015 1104   CREATININE 0.93 01/05/2020 1357   CREATININE 0.80 03/28/2017 1312   CREATININE 0.8 09/20/2016 1131   CREATININE 0.9 12/02/2015 1104      Component Value Date/Time   CALCIUM 9.7 01/05/2020 1357   CALCIUM 9.8 03/28/2017 1312   CALCIUM 9.2 09/20/2016 1131   CALCIUM 9.3 12/02/2015 1104   ALKPHOS 146 (H) 01/05/2020 1357   ALKPHOS 143 (H) 03/28/2017 1312   ALKPHOS 151 (H) 09/20/2016 1131   ALKPHOS 135 12/02/2015 1104   AST 15 01/05/2020 1357   AST 19 12/02/2015 1104   ALT 17 01/05/2020 1357   ALT 24 09/20/2016 1131   ALT 23 12/02/2015 1104   BILITOT 0.3 01/05/2020 1357   BILITOT <0.30 12/02/2015 1104       Impression and Plan: Samantha Maynard is a very pleasant 60 yo caucasian female with history of recurrent pulmonary emboli on lifelong anticoagulation with Xarelto managed by her PCP Dr. Deforest Hoyles.  Iron studies  are pending.  Follow-up in another year.  She can contact our office with any questions or concerns.   Samantha Dawson, NP 9/27/202210:23 AM

## 2021-04-03 DIAGNOSIS — F322 Major depressive disorder, single episode, severe without psychotic features: Secondary | ICD-10-CM | POA: Diagnosis not present

## 2021-04-27 DIAGNOSIS — Z13220 Encounter for screening for lipoid disorders: Secondary | ICD-10-CM | POA: Diagnosis not present

## 2021-04-27 DIAGNOSIS — E1169 Type 2 diabetes mellitus with other specified complication: Secondary | ICD-10-CM | POA: Diagnosis not present

## 2021-04-27 DIAGNOSIS — F900 Attention-deficit hyperactivity disorder, predominantly inattentive type: Secondary | ICD-10-CM | POA: Diagnosis not present

## 2021-04-27 DIAGNOSIS — Z23 Encounter for immunization: Secondary | ICD-10-CM | POA: Diagnosis not present

## 2021-04-27 DIAGNOSIS — Z6837 Body mass index (BMI) 37.0-37.9, adult: Secondary | ICD-10-CM | POA: Diagnosis not present

## 2021-04-27 DIAGNOSIS — I10 Essential (primary) hypertension: Secondary | ICD-10-CM | POA: Diagnosis not present

## 2021-04-27 DIAGNOSIS — I2699 Other pulmonary embolism without acute cor pulmonale: Secondary | ICD-10-CM | POA: Diagnosis not present

## 2021-05-08 DIAGNOSIS — I10 Essential (primary) hypertension: Secondary | ICD-10-CM | POA: Diagnosis not present

## 2021-05-29 DIAGNOSIS — I10 Essential (primary) hypertension: Secondary | ICD-10-CM | POA: Diagnosis not present

## 2021-05-29 DIAGNOSIS — G4733 Obstructive sleep apnea (adult) (pediatric): Secondary | ICD-10-CM | POA: Diagnosis not present

## 2021-06-20 ENCOUNTER — Encounter: Payer: Self-pay | Admitting: Cardiovascular Disease

## 2021-06-20 ENCOUNTER — Ambulatory Visit: Payer: Federal, State, Local not specified - PPO | Admitting: Cardiovascular Disease

## 2021-06-20 ENCOUNTER — Other Ambulatory Visit: Payer: Self-pay

## 2021-06-20 VITALS — BP 130/85 | HR 104 | Ht 63.0 in | Wt 218.6 lb

## 2021-06-20 DIAGNOSIS — E7800 Pure hypercholesterolemia, unspecified: Secondary | ICD-10-CM | POA: Diagnosis not present

## 2021-06-20 DIAGNOSIS — Z86711 Personal history of pulmonary embolism: Secondary | ICD-10-CM

## 2021-06-20 DIAGNOSIS — R0789 Other chest pain: Secondary | ICD-10-CM

## 2021-06-20 DIAGNOSIS — I10 Essential (primary) hypertension: Secondary | ICD-10-CM

## 2021-06-20 DIAGNOSIS — G4733 Obstructive sleep apnea (adult) (pediatric): Secondary | ICD-10-CM | POA: Diagnosis not present

## 2021-06-20 DIAGNOSIS — E782 Mixed hyperlipidemia: Secondary | ICD-10-CM

## 2021-06-20 NOTE — Assessment & Plan Note (Signed)
History of obstructive sleep apnea on CPAP which she benefits from 

## 2021-06-20 NOTE — Assessment & Plan Note (Signed)
History of hyperlipidemia on Zetia with lipid profile performed 04/27/2021 revealing total cholesterol 171, LDL of 94 HDL 62, acceptable for primary prevention given her coronary calcium score of 0.

## 2021-06-20 NOTE — Assessment & Plan Note (Signed)
History of pulmonary embolism on Xarelto.

## 2021-06-20 NOTE — Assessment & Plan Note (Signed)
History of atypical chest pain in the past with a negative Myoview 07/03/2016.  She was seen in the [facility] emergency room after developing chest pain.  She had eaten a hamburger at 11:00 that night.  Work-up was unrevealing.  I did do a coronary calcium score on her 12/10/2019 which was 0.  She is had no recurrent symptoms.

## 2021-06-20 NOTE — Assessment & Plan Note (Signed)
History of essential hypertension blood pressure measured today of 130/85 on low-dose losartan.  This is a new diagnosis.

## 2021-06-20 NOTE — Progress Notes (Signed)
06/20/2021 Samantha Maynard   January 10, 1961  [id number]  Primary Physician Wenda Low, MD Primary Cardiologist: Lorretta Harp MD Samantha Maynard, [place]  HPI:  Samantha Maynard is a 61 y.o.  severely overweight married Caucasian female mother of 2, grandmother of 3 grandchildren referred to me by Dr. Deforest Hoyles for cardiovascular valuation because of chest pain.  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    I last saw her in the office 06/14/2020.  Her risk factors for ischemic heart disease are diabetes with hemoglobin A1c above 9 as well as family history of father who had stents in his mid 36s.  She is never had a heart attack or stroke.  She did have remote history of DVT and subsequent pulmonary emboli and has been on Xarelto since.  Approximately 3 weeks ago after eating hamburger at 11:00 at night she developed chest pain which lasted all night.  She was seen in the ER in [place] where her work-up was unrevealing including negative enzymes.  Etiology was thought to be GERD.  She did have a negative Myoview stress test performed 07/03/2016.   She had a coronary calcium score performed 12/10/2019 which was 0.  Since I saw her a year ago she has had no recurrent symptoms of chest pain.  Current Meds  Medication Sig   acetaminophen (TYLENOL) 325 MG tablet Take 325-650 mg by mouth every 6 (six) hours as needed for mild pain or moderate pain.    clonazePAM (KLONOPIN) 1 MG tablet Take 1 mg by mouth at bedtime as needed (sleep).    ezetimibe (ZETIA) 10 MG tablet Take 1 tablet by mouth once daily   JARDIANCE 25 MG TABS tablet Take 25 mg by mouth daily.   lisdexamfetamine (VYVANSE) 70 MG capsule Take 70 mg by mouth daily.   loratadine (CLARITIN) 10 MG tablet Take 10 mg by mouth daily.   losartan (COZAAR) 25 MG tablet Take 25 mg by mouth daily.   metFORMIN (GLUCOPHAGE) 850 MG tablet TAKE 1  TABLET BY MOUTH TWICE DAILY WITH MEALS FOR 90 DAYS   Multiple Vitamin (MULTIVITAMIN WITH MINERALS) TABS tablet Take 1 tablet by mouth daily.   NYSTATIN powder SMARTSIG:Packet(s) Topical Twice Daily PRN   omeprazole (PRILOSEC) 20 MG capsule Take 1 capsule (20 mg total) by mouth daily. (Patient taking differently: Take 20 mg by mouth daily with supper.)   rivaroxaban (XARELTO) 20 MG TABS tablet Take 20 mg by mouth daily with supper.   venlafaxine XR (EFFEXOR-XR) 150 MG 24 hr capsule Take 150 mg by mouth daily at 6 PM.    venlafaxine XR (EFFEXOR-XR) 75 MG 24 hr capsule Take 75 mg by mouth daily at 6 PM.     No Known Allergies  Social History   Socioeconomic History   Marital status: Married    Spouse name: Not on file   Number of children: Not on file   Years of education: Not on file   Highest education level: Not on file  Occupational History   Not on file  Tobacco Use   Smoking status: Never   Smokeless tobacco: Never   Tobacco comments:    never used tobacco  Vaping Use   Vaping Use: Never used  Substance and Sexual Activity   Alcohol use: Yes    Alcohol/week: 0.0 standard drinks    Comment: rare  Drug use: No   Sexual activity: Not on file  Other Topics Concern   Not on file  Social History Narrative   Not on file   Social Determinants of Health   Financial Resource Strain: Not on file  Food Insecurity: Not on file  Transportation Needs: Not on file  Physical Activity: Not on file  Stress: Not on file  Social Connections: Not on file  Intimate Partner Violence: Not on file     Review of Systems: General: negative for chills, fever, night sweats or weight changes.  Cardiovascular: negative for chest pain, dyspnea on exertion, edema, orthopnea, palpitations, paroxysmal nocturnal dyspnea or shortness of breath Dermatological: negative for rash Respiratory: negative for cough or wheezing Urologic: negative for hematuria Abdominal: negative for nausea, vomiting,  diarrhea, bright red blood per rectum, melena, or hematemesis Neurologic: negative for visual changes, syncope, or dizziness All other systems reviewed and are otherwise negative except as noted above.    Blood pressure 130/85, pulse (!) 104, height 5\' 3"  (1.6 m), weight 218 lb 9.6 oz (99.2 kg), last menstrual period 06/04/2014, SpO2 98 %.  General appearance: alert and no distress Neck: no adenopathy, no carotid bruit, no JVD, supple, symmetrical, trachea midline, and thyroid not enlarged, symmetric, no tenderness/mass/nodules Lungs: clear to auscultation bilaterally Heart: regular rate and rhythm, S1, S2 normal, no murmur, click, rub or gallop Extremities: extremities normal, atraumatic, no cyanosis or edema Pulses: 2+ and symmetric Skin: Skin color, texture, turgor normal. No rashes or lesions Neurologic: Grossly normal  EKG sinus tachycardia at 104 with poor R wave progression.  I personally reviewed this EKG.  ASSESSMENT AND PLAN:   Atypical chest pain History of atypical chest pain in the past with a negative Myoview 07/03/2016.  She was seen in the [facility] emergency room after developing chest pain.  She had eaten a hamburger at 11:00 that night.  Work-up was unrevealing.  I did do a coronary calcium score on her 12/10/2019 which was 0.  She is had no recurrent symptoms.  Hyperlipidemia History of hyperlipidemia on Zetia with lipid profile performed 04/27/2021 revealing total cholesterol 171, LDL of 94 HDL 62, acceptable for primary prevention given her coronary calcium score of 0.  Obstructive sleep apnea History of obstructive sleep apnea on CPAP which she benefits from.  Essential hypertension History of essential hypertension blood pressure measured today of 130/85 on low-dose losartan.  This is a new diagnosis.  History of pulmonary embolism History of pulmonary embolism on Xarelto.     Lorretta Harp MD FACP,FACC,FAHA, [facility] 06/20/2021 2:05 PM

## 2021-06-20 NOTE — Patient Instructions (Signed)

## 2021-06-26 DIAGNOSIS — F322 Major depressive disorder, single episode, severe without psychotic features: Secondary | ICD-10-CM | POA: Diagnosis not present

## 2021-08-08 DIAGNOSIS — I10 Essential (primary) hypertension: Secondary | ICD-10-CM | POA: Diagnosis not present

## 2021-08-08 DIAGNOSIS — E1169 Type 2 diabetes mellitus with other specified complication: Secondary | ICD-10-CM | POA: Diagnosis not present

## 2021-08-08 DIAGNOSIS — G4733 Obstructive sleep apnea (adult) (pediatric): Secondary | ICD-10-CM | POA: Diagnosis not present

## 2021-08-08 DIAGNOSIS — F331 Major depressive disorder, recurrent, moderate: Secondary | ICD-10-CM | POA: Diagnosis not present

## 2021-08-09 DIAGNOSIS — G4733 Obstructive sleep apnea (adult) (pediatric): Secondary | ICD-10-CM | POA: Diagnosis not present

## 2021-09-09 DIAGNOSIS — G4733 Obstructive sleep apnea (adult) (pediatric): Secondary | ICD-10-CM | POA: Diagnosis not present

## 2021-09-25 DIAGNOSIS — F322 Major depressive disorder, single episode, severe without psychotic features: Secondary | ICD-10-CM | POA: Diagnosis not present

## 2021-10-09 DIAGNOSIS — G4733 Obstructive sleep apnea (adult) (pediatric): Secondary | ICD-10-CM | POA: Diagnosis not present

## 2021-10-17 DIAGNOSIS — N904 Leukoplakia of vulva: Secondary | ICD-10-CM | POA: Diagnosis not present

## 2021-10-17 DIAGNOSIS — Z86711 Personal history of pulmonary embolism: Secondary | ICD-10-CM | POA: Diagnosis not present

## 2021-10-17 DIAGNOSIS — Z01411 Encounter for gynecological examination (general) (routine) with abnormal findings: Secondary | ICD-10-CM | POA: Diagnosis not present

## 2021-10-23 ENCOUNTER — Other Ambulatory Visit: Payer: Self-pay | Admitting: Cardiovascular Disease

## 2021-11-16 ENCOUNTER — Other Ambulatory Visit: Payer: Self-pay | Admitting: Internal Medicine

## 2021-11-16 DIAGNOSIS — Z1231 Encounter for screening mammogram for malignant neoplasm of breast: Secondary | ICD-10-CM

## 2021-11-29 ENCOUNTER — Ambulatory Visit
Admission: RE | Admit: 2021-11-29 | Discharge: 2021-11-29 | Disposition: A | Discharge status: Home / Self Care | Payer: Federal, State, Local not specified - PPO | Source: Ambulatory Visit | Attending: Internal Medicine | Admitting: Internal Medicine

## 2021-11-29 DIAGNOSIS — Z1231 Encounter for screening mammogram for malignant neoplasm of breast: Secondary | ICD-10-CM

## 2021-12-12 DIAGNOSIS — Z6837 Body mass index (BMI) 37.0-37.9, adult: Secondary | ICD-10-CM | POA: Diagnosis not present

## 2021-12-12 DIAGNOSIS — F900 Attention-deficit hyperactivity disorder, predominantly inattentive type: Secondary | ICD-10-CM | POA: Diagnosis not present

## 2021-12-12 DIAGNOSIS — F331 Major depressive disorder, recurrent, moderate: Secondary | ICD-10-CM | POA: Diagnosis not present

## 2021-12-12 DIAGNOSIS — E1169 Type 2 diabetes mellitus with other specified complication: Secondary | ICD-10-CM | POA: Diagnosis not present

## 2021-12-12 DIAGNOSIS — I10 Essential (primary) hypertension: Secondary | ICD-10-CM | POA: Diagnosis not present

## 2021-12-18 DIAGNOSIS — F322 Major depressive disorder, single episode, severe without psychotic features: Secondary | ICD-10-CM | POA: Diagnosis not present

## 2022-01-09 DIAGNOSIS — H25813 Combined forms of age-related cataract, bilateral: Secondary | ICD-10-CM | POA: Diagnosis not present

## 2022-01-09 DIAGNOSIS — E119 Type 2 diabetes mellitus without complications: Secondary | ICD-10-CM | POA: Diagnosis not present

## 2022-01-23 DIAGNOSIS — N3000 Acute cystitis without hematuria: Secondary | ICD-10-CM | POA: Diagnosis not present

## 2022-01-23 DIAGNOSIS — R300 Dysuria: Secondary | ICD-10-CM | POA: Diagnosis not present

## 2022-02-14 ENCOUNTER — Inpatient Hospital Stay: Payer: Federal, State, Local not specified - PPO | Attending: Hematology & Oncology | Admitting: Hematology & Oncology

## 2022-02-14 ENCOUNTER — Inpatient Hospital Stay: Payer: Federal, State, Local not specified - PPO

## 2022-02-14 ENCOUNTER — Encounter: Payer: Self-pay | Admitting: Hematology & Oncology

## 2022-02-14 ENCOUNTER — Other Ambulatory Visit: Payer: Self-pay

## 2022-02-14 VITALS — BP 145/92 | HR 104 | Temp 98.1°F | Resp 17 | Ht 62.0 in | Wt 206.0 lb

## 2022-02-14 DIAGNOSIS — D509 Iron deficiency anemia, unspecified: Secondary | ICD-10-CM | POA: Diagnosis not present

## 2022-02-14 DIAGNOSIS — D500 Iron deficiency anemia secondary to blood loss (chronic): Secondary | ICD-10-CM

## 2022-02-14 DIAGNOSIS — K909 Intestinal malabsorption, unspecified: Secondary | ICD-10-CM

## 2022-02-14 DIAGNOSIS — I2699 Other pulmonary embolism without acute cor pulmonale: Secondary | ICD-10-CM | POA: Diagnosis not present

## 2022-02-14 DIAGNOSIS — Z7901 Long term (current) use of anticoagulants: Secondary | ICD-10-CM | POA: Insufficient documentation

## 2022-02-14 LAB — CBC WITH DIFFERENTIAL (CANCER CENTER ONLY)
Abs Immature Granulocytes: 0.04 10*3/uL (ref 0.00–0.07)
Basophils Absolute: 0 10*3/uL (ref 0.0–0.1)
Basophils Relative: 0 %
Eosinophils Absolute: 0 10*3/uL (ref 0.0–0.5)
Eosinophils Relative: 0 %
HCT: 47.2 % — ABNORMAL HIGH (ref 36.0–46.0)
Hemoglobin: 15.5 g/dL — ABNORMAL HIGH (ref 12.0–15.0)
Immature Granulocytes: 1 %
Lymphocytes Relative: 30 %
Lymphs Abs: 2 10*3/uL (ref 0.7–4.0)
MCH: 28.7 pg (ref 26.0–34.0)
MCHC: 32.8 g/dL (ref 30.0–36.0)
MCV: 87.2 fL (ref 80.0–100.0)
Monocytes Absolute: 0.5 10*3/uL (ref 0.1–1.0)
Monocytes Relative: 7 %
Neutro Abs: 4.1 10*3/uL (ref 1.7–7.7)
Neutrophils Relative %: 62 %
Platelet Count: 281 10*3/uL (ref 150–400)
RBC: 5.41 MIL/uL — ABNORMAL HIGH (ref 3.87–5.11)
RDW: 13.8 % (ref 11.5–15.5)
WBC Count: 6.6 10*3/uL (ref 4.0–10.5)
nRBC: 0 % (ref 0.0–0.2)

## 2022-02-14 LAB — CMP (CANCER CENTER ONLY)
ALT: 20 U/L (ref 0–44)
AST: 17 U/L (ref 15–41)
Albumin: 4.5 g/dL (ref 3.5–5.0)
Alkaline Phosphatase: 117 U/L (ref 38–126)
Anion gap: 10 (ref 5–15)
BUN: 19 mg/dL (ref 6–20)
CO2: 29 mmol/L (ref 22–32)
Calcium: 10.1 mg/dL (ref 8.9–10.3)
Chloride: 98 mmol/L (ref 98–111)
Creatinine: 0.92 mg/dL (ref 0.44–1.00)
GFR, Estimated: >60 mL/min (ref >60)
Glucose, Bld: 119 mg/dL — ABNORMAL HIGH (ref 70–99)
Potassium: 4.1 mmol/L (ref 3.5–5.1)
Sodium: 137 mmol/L (ref 135–145)
Total Bilirubin: 0.4 mg/dL (ref 0.3–1.2)
Total Protein: 7.5 g/dL (ref 6.5–8.1)

## 2022-02-14 LAB — RETICULOCYTES
Immature Retic Fract: 11.5 % (ref 2.3–15.9)
RBC.: 5.47 MIL/uL — ABNORMAL HIGH (ref 3.87–5.11)
Retic Count, Absolute: 85.1 10*3/uL (ref 19.0–186.0)
Retic Ct Pct: 1.6 % (ref 0.4–3.1)

## 2022-02-14 LAB — FERRITIN: Ferritin: 11 ng/mL (ref 11–307)

## 2022-02-14 NOTE — Progress Notes (Signed)
Hematology and Oncology Follow Up Visit  Samantha Maynard [id number] 1960/12/19 61 y.o. 02/14/2022   Principle Diagnosis:  Iron deficiency anemia Recurrent pulmonary emboli   Current Therapy:         IV iron as indicated Xarelto 20 mg PO daily - Lifelong, managed by Dr. Deforest Hoyles   Interim History:  Samantha Maynard is here today for follow-up.  It has been a tough year for her.  Both her mom and her mom sister passed away this year.  I think her mom passed away in 2022-08-16.  It sounds like she had recurrent lymphoma.  Her sister passed away in November 16, 2022, I think from head neck cancer.  She is worried about this being hereditary.  I told her that in the vast, vast majority of situations, there is no hereditary component to either lymphoma or head and neck cancer.  She has had no problems with cough or shortness of breath.  She has had no bleeding issues.  She is on Xarelto.  There is no change in bowel or bladder habits.  She had a mammogram done on 11/29/2021.  This did show that there was no problems with the breast and 1 year follow-up was recommended.  Her iron studies that were done last year in September showed a ferritin of 21 with an iron saturation 22%.  She has had no problems with COVID.  She is trying to watch her weight.  Overall, her performance status is ECOG 1.  Samantha Maynard is doing well but notes some fatigue today. Her little dog had her up at 3 am this morning.  She has not noted any episodes of blood loss. No bruising or petechiae.  Hgb is 16.6, MCV 85, platelets 275 and WBC count 6.9. Iron studies pending.  No fever, chills, n/v, cough, rash, dizziness, SOB, chest pain, palpitations, abdominal pain or changes in bowel or bladder habits.  No swelling, tenderness, numbness or tingling in her extremities at this time. She has occasional puffiness in the left ankle when standing for a long period of time. This resolves once she props up her foot.  No falls or syncope.  She has  maintained a good appetite and is staying hydrated throughout the day.  Her weight is stable at 215 lbs.   ECOG Performance Status: 1 - Symptomatic but completely ambulatory  Medications:  Allergies as of 02/14/2022   No Known Allergies      Medication List        Accurate as of February 14, 2022 12:19 PM. If you have any questions, ask your nurse or doctor.          acetaminophen 325 MG tablet Commonly known as: TYLENOL Take 325-650 mg by mouth every 6 (six) hours as needed for mild pain or moderate pain.   clonazePAM 1 MG tablet Commonly known as: KLONOPIN Take 1 mg by mouth at bedtime as needed (sleep).   ezetimibe 10 MG tablet Commonly known as: ZETIA Take 1 tablet by mouth once daily   Jardiance 25 MG Tabs tablet Generic drug: empagliflozin Take 25 mg by mouth daily.   lisdexamfetamine 70 MG capsule Commonly known as: VYVANSE Take 70 mg by mouth daily.   loratadine 10 MG tablet Commonly known as: CLARITIN Take 10 mg by mouth daily.   losartan 25 MG tablet Commonly known as: COZAAR Take 25 mg by mouth daily.   metFORMIN 850 MG tablet Commonly known as: GLUCOPHAGE TAKE 1 TABLET BY MOUTH TWICE DAILY WITH MEALS FOR  90 DAYS   multivitamin with minerals Tabs tablet Take 1 tablet by mouth daily.   nystatin powder Generic drug: nystatin SMARTSIG:Packet(s) Topical Twice Daily PRN   omeprazole 20 MG capsule Commonly known as: PRILOSEC Take 1 capsule (20 mg total) by mouth daily. What changed: when to take this   rivaroxaban 20 MG Tabs tablet Commonly known as: XARELTO Take 20 mg by mouth daily with supper.   venlafaxine XR 150 MG 24 hr capsule Commonly known as: EFFEXOR-XR Take 150 mg by mouth daily at 6 PM.   venlafaxine XR 75 MG 24 hr capsule Commonly known as: EFFEXOR-XR Take 75 mg by mouth daily at 6 PM.        Allergies: No Known Allergies  Past Medical History, Surgical history, Social history, and Family History were reviewed and  updated.  Review of Systems: All other 10 point review of systems is negative.   Physical Exam:  height is '5\' 2"'$  (1.575 m) and weight is 206 lb (93.4 kg). Her oral temperature is 98.1 F (36.7 C). Her blood pressure is 145/92 (abnormal) and her pulse is 104 (abnormal). Her respiration is 17 and oxygen saturation is 99%.   Wt Readings from Last 3 Encounters:  02/14/22 206 lb (93.4 kg)  06/20/21 218 lb 9.6 oz (99.2 kg)  02/14/21 215 lb 1.9 oz (97.6 kg)    Ocular: Sclerae unicteric, pupils equal, round and reactive to light Ear-nose-throat: Oropharynx clear, dentition fair Lymphatic: No cervical or supraclavicular adenopathy Lungs no rales or rhonchi, good excursion bilaterally Heart regular rate and rhythm, no murmur appreciated Abd soft, nontender, positive bowel sounds MSK no focal spinal tenderness, no joint edema Neuro: non-focal, well-oriented, appropriate affect Breasts: Deferred   Lab Results  Component Value Date   WBC 6.6 02/14/2022   HGB 15.5 (H) 02/14/2022   HCT 47.2 (H) 02/14/2022   MCV 87.2 02/14/2022   PLT 281 02/14/2022   Lab Results  Component Value Date   FERRITIN 21 02/14/2021   IRON 76 02/14/2021   TIBC 345 02/14/2021   UIBC 269 02/14/2021   IRONPCTSAT 22 02/14/2021   Lab Results  Component Value Date   RETICCTPCT 1.6 02/14/2022   RBC 5.47 (H) 02/14/2022   RBC 5.41 (H) 02/14/2022   RETICCTABS 60.6 03/25/2014   No results found for: "KPAFRELGTCHN", "LAMBDASER", "KAPLAMBRATIO" No results found for: "IGGSERUM", "IGA", "IGMSERUM" No results found for: "TOTALPROTELP", "ALBUMINELP", "A1GS", "A2GS", "BETS", "BETA2SER", "GAMS", "MSPIKE", "SPEI"   Chemistry      Component Value Date/Time   NA 137 02/14/2022 1123   NA 138 03/28/2017 1312   NA 142 09/20/2016 1131   NA 139 12/02/2015 1104   K 4.1 02/14/2022 1123   K 4.3 03/28/2017 1312   K 3.9 09/20/2016 1131   K 4.1 12/02/2015 1104   CL 98 02/14/2022 1123   CL 100 03/28/2017 1312   CL 102  09/20/2016 1131   CO2 29 02/14/2022 1123   CO2 27 03/28/2017 1312   CO2 27 09/20/2016 1131   CO2 25 12/02/2015 1104   BUN 19 02/14/2022 1123   BUN 10 03/28/2017 1312   BUN 10 09/20/2016 1131   BUN 17.5 12/02/2015 1104   CREATININE 0.92 02/14/2022 1123   CREATININE 0.80 03/28/2017 1312   CREATININE 0.8 09/20/2016 1131   CREATININE 0.9 12/02/2015 1104      Component Value Date/Time   CALCIUM 10.1 02/14/2022 1123   CALCIUM 9.8 03/28/2017 1312   CALCIUM 9.2 09/20/2016 1131   CALCIUM 9.3  12/02/2015 1104   ALKPHOS 117 02/14/2022 1123   ALKPHOS 143 (H) 03/28/2017 1312   ALKPHOS 151 (H) 09/20/2016 1131   ALKPHOS 135 12/02/2015 1104   AST 17 02/14/2022 1123   AST 19 12/02/2015 1104   ALT 20 02/14/2022 1123   ALT 24 09/20/2016 1131   ALT 23 12/02/2015 1104   BILITOT 0.4 02/14/2022 1123   BILITOT <0.30 12/02/2015 1104       Impression and Plan: Samantha Maynard is a very pleasant 61 yo caucasian female with history of recurrent pulmonary emboli on lifelong anticoagulation with Xarelto managed by her PCP Dr. Deforest Hoyles.   I cannot imagine that her iron studies will be on the low side.  Her hemoglobin certainly is quite good.  She does not have a low MCV.  I feel bad about her mom and and.  They both live [place].  We will still plan for 1 year follow-up for Samantha Maynard.  She certainly is a lot of fun to talk to.  Volanda Napoleon, MD 9/27/202312:19 PM

## 2022-02-15 LAB — IRON AND IRON BINDING CAPACITY (CC-WL,HP ONLY)
Iron: 51 ug/dL (ref 28–170)
Saturation Ratios: 13 % (ref 10.4–31.8)
TIBC: 385 ug/dL (ref 250–450)
UIBC: 334 ug/dL (ref 148–442)

## 2022-02-26 ENCOUNTER — Inpatient Hospital Stay: Payer: Federal, State, Local not specified - PPO | Attending: Hematology & Oncology

## 2022-02-26 VITALS — BP 146/92 | HR 97 | Temp 97.8°F | Resp 19

## 2022-02-26 DIAGNOSIS — D509 Iron deficiency anemia, unspecified: Secondary | ICD-10-CM | POA: Insufficient documentation

## 2022-02-26 DIAGNOSIS — Z79899 Other long term (current) drug therapy: Secondary | ICD-10-CM | POA: Insufficient documentation

## 2022-02-26 DIAGNOSIS — K909 Intestinal malabsorption, unspecified: Secondary | ICD-10-CM

## 2022-02-26 MED ORDER — SODIUM CHLORIDE 0.9 % IV SOLN
510.0000 mg | Freq: Once | INTRAVENOUS | Status: AC
  Administered 2022-02-26: 510 mg via INTRAVENOUS
  Filled 2022-02-26: qty 17

## 2022-02-26 NOTE — Patient Instructions (Signed)

## 2022-02-26 NOTE — Progress Notes (Signed)
Pt declined to stay for post infusion observation period. Pt stated she has tolerated medication multiple times prior without difficulty. Pt aware to call clinic with any questions or concerns. Pt verbalized understanding and had no further questions.  ? ?

## 2022-03-05 ENCOUNTER — Inpatient Hospital Stay: Payer: Federal, State, Local not specified - PPO

## 2022-03-05 VITALS — BP 142/85 | HR 90 | Temp 98.0°F | Resp 18

## 2022-03-05 DIAGNOSIS — K909 Intestinal malabsorption, unspecified: Secondary | ICD-10-CM

## 2022-03-05 DIAGNOSIS — Z79899 Other long term (current) drug therapy: Secondary | ICD-10-CM | POA: Diagnosis not present

## 2022-03-05 DIAGNOSIS — D509 Iron deficiency anemia, unspecified: Secondary | ICD-10-CM

## 2022-03-05 MED ORDER — SODIUM CHLORIDE 0.9 % IV SOLN: Freq: Once | INTRAVENOUS | Status: AC

## 2022-03-05 MED ORDER — SODIUM CHLORIDE 0.9 % IV SOLN
510.0000 mg | Freq: Once | INTRAVENOUS | Status: AC
  Administered 2022-03-05: 510 mg via INTRAVENOUS
  Filled 2022-03-05: qty 17

## 2022-03-05 NOTE — Patient Instructions (Signed)

## 2022-03-12 DIAGNOSIS — F322 Major depressive disorder, single episode, severe without psychotic features: Secondary | ICD-10-CM | POA: Diagnosis not present

## 2022-03-28 DIAGNOSIS — G4733 Obstructive sleep apnea (adult) (pediatric): Secondary | ICD-10-CM | POA: Diagnosis not present

## 2022-04-27 DIAGNOSIS — G4733 Obstructive sleep apnea (adult) (pediatric): Secondary | ICD-10-CM | POA: Diagnosis not present

## 2022-05-28 DIAGNOSIS — G4733 Obstructive sleep apnea (adult) (pediatric): Secondary | ICD-10-CM | POA: Diagnosis not present

## 2022-05-30 DIAGNOSIS — G4733 Obstructive sleep apnea (adult) (pediatric): Secondary | ICD-10-CM | POA: Diagnosis not present

## 2022-05-30 DIAGNOSIS — I10 Essential (primary) hypertension: Secondary | ICD-10-CM | POA: Diagnosis not present

## 2022-06-04 DIAGNOSIS — F322 Major depressive disorder, single episode, severe without psychotic features: Secondary | ICD-10-CM | POA: Diagnosis not present

## 2022-06-05 DIAGNOSIS — Z6835 Body mass index (BMI) 35.0-35.9, adult: Secondary | ICD-10-CM | POA: Diagnosis not present

## 2022-06-05 DIAGNOSIS — I10 Essential (primary) hypertension: Secondary | ICD-10-CM | POA: Diagnosis not present

## 2022-06-05 DIAGNOSIS — E1169 Type 2 diabetes mellitus with other specified complication: Secondary | ICD-10-CM | POA: Diagnosis not present

## 2022-06-05 DIAGNOSIS — Z13220 Encounter for screening for lipoid disorders: Secondary | ICD-10-CM | POA: Diagnosis not present

## 2022-06-05 DIAGNOSIS — D509 Iron deficiency anemia, unspecified: Secondary | ICD-10-CM | POA: Diagnosis not present

## 2022-06-05 DIAGNOSIS — Z0000 Encounter for general adult medical examination without abnormal findings: Secondary | ICD-10-CM | POA: Diagnosis not present

## 2022-06-05 DIAGNOSIS — Z23 Encounter for immunization: Secondary | ICD-10-CM | POA: Diagnosis not present

## 2022-07-11 DIAGNOSIS — G4733 Obstructive sleep apnea (adult) (pediatric): Secondary | ICD-10-CM | POA: Diagnosis not present

## 2022-08-09 DIAGNOSIS — G4733 Obstructive sleep apnea (adult) (pediatric): Secondary | ICD-10-CM | POA: Diagnosis not present

## 2022-08-27 DIAGNOSIS — F322 Major depressive disorder, single episode, severe without psychotic features: Secondary | ICD-10-CM | POA: Diagnosis not present

## 2022-09-09 DIAGNOSIS — G4733 Obstructive sleep apnea (adult) (pediatric): Secondary | ICD-10-CM | POA: Diagnosis not present

## 2022-10-31 ENCOUNTER — Other Ambulatory Visit: Payer: Self-pay | Admitting: Internal Medicine

## 2022-10-31 DIAGNOSIS — Z1231 Encounter for screening mammogram for malignant neoplasm of breast: Secondary | ICD-10-CM

## 2022-11-19 DIAGNOSIS — F322 Major depressive disorder, single episode, severe without psychotic features: Secondary | ICD-10-CM | POA: Diagnosis not present

## 2022-11-26 DIAGNOSIS — E119 Type 2 diabetes mellitus without complications: Secondary | ICD-10-CM | POA: Diagnosis not present

## 2022-12-04 ENCOUNTER — Ambulatory Visit
Admission: RE | Admit: 2022-12-04 | Discharge: 2022-12-04 | Disposition: A | Discharge status: Home / Self Care | Payer: Federal, State, Local not specified - PPO | Source: Ambulatory Visit | Attending: Internal Medicine | Admitting: Internal Medicine

## 2022-12-04 DIAGNOSIS — Z1231 Encounter for screening mammogram for malignant neoplasm of breast: Secondary | ICD-10-CM

## 2022-12-05 DIAGNOSIS — I10 Essential (primary) hypertension: Secondary | ICD-10-CM | POA: Diagnosis not present

## 2022-12-05 DIAGNOSIS — Z86711 Personal history of pulmonary embolism: Secondary | ICD-10-CM | POA: Diagnosis not present

## 2022-12-05 DIAGNOSIS — Z23 Encounter for immunization: Secondary | ICD-10-CM | POA: Diagnosis not present

## 2022-12-05 DIAGNOSIS — G4733 Obstructive sleep apnea (adult) (pediatric): Secondary | ICD-10-CM | POA: Diagnosis not present

## 2022-12-05 DIAGNOSIS — E1169 Type 2 diabetes mellitus with other specified complication: Secondary | ICD-10-CM | POA: Diagnosis not present

## 2022-12-25 DIAGNOSIS — Z01419 Encounter for gynecological examination (general) (routine) without abnormal findings: Secondary | ICD-10-CM | POA: Diagnosis not present

## 2022-12-25 DIAGNOSIS — Z86711 Personal history of pulmonary embolism: Secondary | ICD-10-CM | POA: Diagnosis not present

## 2022-12-25 DIAGNOSIS — B372 Candidiasis of skin and nail: Secondary | ICD-10-CM | POA: Diagnosis not present

## 2022-12-25 DIAGNOSIS — L900 Lichen sclerosus et atrophicus: Secondary | ICD-10-CM | POA: Diagnosis not present

## 2023-01-28 DIAGNOSIS — G4733 Obstructive sleep apnea (adult) (pediatric): Secondary | ICD-10-CM | POA: Diagnosis not present

## 2023-02-11 DIAGNOSIS — F322 Major depressive disorder, single episode, severe without psychotic features: Secondary | ICD-10-CM | POA: Diagnosis not present

## 2023-02-11 DIAGNOSIS — N3001 Acute cystitis with hematuria: Secondary | ICD-10-CM | POA: Diagnosis not present

## 2023-02-12 ENCOUNTER — Ambulatory Visit: Payer: Federal, State, Local not specified - PPO | Admitting: Family

## 2023-02-12 ENCOUNTER — Inpatient Hospital Stay: Payer: Federal, State, Local not specified - PPO

## 2023-02-18 ENCOUNTER — Encounter: Payer: Self-pay | Admitting: Medical Oncology

## 2023-02-18 ENCOUNTER — Inpatient Hospital Stay: Payer: Federal, State, Local not specified - PPO | Attending: Hematology & Oncology

## 2023-02-18 ENCOUNTER — Inpatient Hospital Stay: Payer: Federal, State, Local not specified - PPO | Admitting: Medical Oncology

## 2023-02-18 ENCOUNTER — Other Ambulatory Visit: Payer: Self-pay | Admitting: *Deleted

## 2023-02-18 VITALS — BP 146/83 | HR 94 | Temp 98.5°F | Resp 18 | Wt 202.0 lb

## 2023-02-18 DIAGNOSIS — Z7901 Long term (current) use of anticoagulants: Secondary | ICD-10-CM | POA: Diagnosis not present

## 2023-02-18 DIAGNOSIS — Z86711 Personal history of pulmonary embolism: Secondary | ICD-10-CM | POA: Insufficient documentation

## 2023-02-18 DIAGNOSIS — D509 Iron deficiency anemia, unspecified: Secondary | ICD-10-CM | POA: Insufficient documentation

## 2023-02-18 DIAGNOSIS — D582 Other hemoglobinopathies: Secondary | ICD-10-CM | POA: Diagnosis not present

## 2023-02-18 DIAGNOSIS — G4730 Sleep apnea, unspecified: Secondary | ICD-10-CM | POA: Diagnosis not present

## 2023-02-18 LAB — FERRITIN: Ferritin: 69 ng/mL (ref 11–307)

## 2023-02-18 LAB — COMPREHENSIVE METABOLIC PANEL
ALT: 26 U/L (ref 0–44)
AST: 16 U/L (ref 15–41)
Albumin: 4.2 g/dL (ref 3.5–5.0)
Alkaline Phosphatase: 91 U/L (ref 38–126)
Anion gap: 9 (ref 5–15)
BUN: 24 mg/dL — ABNORMAL HIGH (ref 8–23)
CO2: 34 mmol/L — ABNORMAL HIGH (ref 22–32)
Calcium: 9.4 mg/dL (ref 8.9–10.3)
Chloride: 97 mmol/L — ABNORMAL LOW (ref 98–111)
Creatinine, Ser: 1.06 mg/dL — ABNORMAL HIGH (ref 0.44–1.00)
GFR, Estimated: 60 mL/min — ABNORMAL LOW (ref >60)
Glucose, Bld: 91 mg/dL (ref 70–99)
Potassium: 4.2 mmol/L (ref 3.5–5.1)
Sodium: 140 mmol/L (ref 135–145)
Total Bilirubin: 0.5 mg/dL (ref 0.3–1.2)
Total Protein: 7.4 g/dL (ref 6.5–8.1)

## 2023-02-18 LAB — CBC WITH DIFFERENTIAL (CANCER CENTER ONLY)
Abs Immature Granulocytes: 0.11 10*3/uL — ABNORMAL HIGH (ref 0.00–0.07)
Basophils Absolute: 0 10*3/uL (ref 0.0–0.1)
Basophils Relative: 0 %
Eosinophils Absolute: 0 10*3/uL (ref 0.0–0.5)
Eosinophils Relative: 0 %
HCT: 50.8 % — ABNORMAL HIGH (ref 36.0–46.0)
Hemoglobin: 17.1 g/dL — ABNORMAL HIGH (ref 12.0–15.0)
Immature Granulocytes: 1 %
Lymphocytes Relative: 35 %
Lymphs Abs: 3.1 10*3/uL (ref 0.7–4.0)
MCH: 30.1 pg (ref 26.0–34.0)
MCHC: 33.7 g/dL (ref 30.0–36.0)
MCV: 89.3 fL (ref 80.0–100.0)
Monocytes Absolute: 0.5 10*3/uL (ref 0.1–1.0)
Monocytes Relative: 6 %
Neutro Abs: 5.1 10*3/uL (ref 1.7–7.7)
Neutrophils Relative %: 58 %
Platelet Count: 265 10*3/uL (ref 150–400)
RBC: 5.69 MIL/uL — ABNORMAL HIGH (ref 3.87–5.11)
RDW: 12.7 % (ref 11.5–15.5)
WBC Count: 8.8 10*3/uL (ref 4.0–10.5)
nRBC: 0 % (ref 0.0–0.2)

## 2023-02-18 LAB — RETICULOCYTES
Immature Retic Fract: 9.5 % (ref 2.3–15.9)
RBC.: 5.8 MIL/uL — ABNORMAL HIGH (ref 3.87–5.11)
Retic Count, Absolute: 117.2 10*3/uL (ref 19.0–186.0)
Retic Ct Pct: 2 % (ref 0.4–3.1)

## 2023-02-18 LAB — IRON AND IRON BINDING CAPACITY (CC-WL,HP ONLY)
Iron: 95 ug/dL (ref 28–170)
Saturation Ratios: 29 % (ref 10.4–31.8)
TIBC: 323 ug/dL (ref 250–450)
UIBC: 228 ug/dL (ref 148–442)

## 2023-02-18 LAB — LACTATE DEHYDROGENASE: LDH: 182 U/L (ref 98–192)

## 2023-02-18 NOTE — Progress Notes (Signed)
Hematology and Oncology Follow Up Visit  Samantha Maynard [id number] 1960/09/26 62 y.o. 02/18/2023   Principle Diagnosis:  Iron deficiency anemia Recurrent pulmonary emboli   Current Therapy:         IV iron as indicated- Feraheme PRN- last dose on 03/05/2022 Xarelto 20 mg PO daily - Lifelong, managed by Dr. Eula Listen   Interim History:  Samantha Maynard is here today for follow-up.    She has had no problems with cough or shortness of breath. She has had no bleeding issues.  She is on Xarelto.  There is no change in bowel or bladder habits.  She is a non-smoker. She has sleep apnea and uses her machine nightly. She is not on any hormonal medication.   She is UTD on her mammogram done on 12/05/2022. BI-RADS 1  Her iron studies that were done last year in September showed a ferritin of 11 with an iron saturation 13%.  Has had intentional weight loss with Ozempic.   Wt Readings from Last 3 Encounters:  02/18/23 202 lb (91.6 kg)  02/14/22 206 lb (93.4 kg)  06/20/21 218 lb 9.6 oz (99.2 kg)    ECOG Performance Status: 1 - Symptomatic but completely ambulatory  Medications:  Allergies as of 02/18/2023   No Known Allergies      Medication List        Accurate as of February 18, 2023  9:51 AM. If you have any questions, ask your nurse or doctor.          acetaminophen 325 MG tablet Commonly known as: TYLENOL Take 325-650 mg by mouth every 6 (six) hours as needed for mild pain or moderate pain.   clonazePAM 1 MG tablet Commonly known as: KLONOPIN Take 1 mg by mouth at bedtime as needed (sleep).   ezetimibe 10 MG tablet Commonly known as: ZETIA Take 1 tablet by mouth once daily   Jardiance 25 MG Tabs tablet Generic drug: empagliflozin Take 25 mg by mouth daily.   lisdexamfetamine 70 MG capsule Commonly known as: VYVANSE Take 70 mg by mouth daily.   loratadine 10 MG tablet Commonly known as: CLARITIN Take 10 mg by mouth daily.   losartan 25 MG  tablet Commonly known as: COZAAR Take 25 mg by mouth daily.   metFORMIN 850 MG tablet Commonly known as: GLUCOPHAGE TAKE 1 TABLET BY MOUTH TWICE DAILY WITH MEALS FOR 90 DAYS   multivitamin with minerals Tabs tablet Take 1 tablet by mouth daily.   nystatin powder Generic drug: nystatin SMARTSIG:Packet(s) Topical Twice Daily PRN   omeprazole 20 MG capsule Commonly known as: PRILOSEC Take 1 capsule (20 mg total) by mouth daily. What changed: when to take this   rivaroxaban 20 MG Tabs tablet Commonly known as: XARELTO Take 20 mg by mouth daily with supper.   venlafaxine XR 150 MG 24 hr capsule Commonly known as: EFFEXOR-XR Take 150 mg by mouth daily at 6 PM.   venlafaxine XR 75 MG 24 hr capsule Commonly known as: EFFEXOR-XR Take 75 mg by mouth daily at 6 PM.        Allergies: No Known Allergies  Past Medical History, Surgical history, Social history, and Family History were reviewed and updated.  Review of Systems: All other 10 point review of systems is negative.   Physical Exam:  vitals were not taken for this visit.   Wt Readings from Last 3 Encounters:  02/14/22 206 lb (93.4 kg)  06/20/21 218 lb 9.6 oz (99.2 kg)  02/14/21  215 lb 1.9 oz (97.6 kg)    Ocular: Sclerae unicteric, pupils equal, round and reactive to light Ear-nose-throat: Oropharynx clear, dentition fair Lymphatic: No cervical or supraclavicular adenopathy Lungs no rales or rhonchi, good excursion bilaterally Heart regular rate and rhythm, no murmur appreciated Abd soft, nontender, positive bowel sounds MSK no focal spinal tenderness, no joint edema Neuro: non-focal, well-oriented, appropriate affect  Lab Results  Component Value Date   WBC 8.8 02/18/2023   HGB 17.1 (H) 02/18/2023   HCT 50.8 (H) 02/18/2023   MCV 89.3 02/18/2023   PLT 265 02/18/2023   Lab Results  Component Value Date   FERRITIN 11 02/14/2022   IRON 51 02/14/2022   TIBC 385 02/14/2022   UIBC 334 02/14/2022    IRONPCTSAT 13 02/14/2022   Lab Results  Component Value Date   RETICCTPCT 1.6 02/14/2022   RBC 5.69 (H) 02/18/2023   RETICCTABS 60.6 03/25/2014   No results found for: "KPAFRELGTCHN", "LAMBDASER", "KAPLAMBRATIO" No results found for: "IGGSERUM", "IGA", "IGMSERUM" No results found for: "TOTALPROTELP", "ALBUMINELP", "A1GS", "A2GS", "BETS", "BETA2SER", "GAMS", "MSPIKE", "SPEI"   Chemistry      Component Value Date/Time   NA 137 02/14/2022 1123   NA 138 03/28/2017 1312   NA 142 09/20/2016 1131   NA 139 12/02/2015 1104   K 4.1 02/14/2022 1123   K 4.3 03/28/2017 1312   K 3.9 09/20/2016 1131   K 4.1 12/02/2015 1104   CL 98 02/14/2022 1123   CL 100 03/28/2017 1312   CL 102 09/20/2016 1131   CO2 29 02/14/2022 1123   CO2 27 03/28/2017 1312   CO2 27 09/20/2016 1131   CO2 25 12/02/2015 1104   BUN 19 02/14/2022 1123   BUN 10 03/28/2017 1312   BUN 10 09/20/2016 1131   BUN 17.5 12/02/2015 1104   CREATININE 0.92 02/14/2022 1123   CREATININE 0.80 03/28/2017 1312   CREATININE 0.8 09/20/2016 1131   CREATININE 0.9 12/02/2015 1104      Component Value Date/Time   CALCIUM 10.1 02/14/2022 1123   CALCIUM 9.8 03/28/2017 1312   CALCIUM 9.2 09/20/2016 1131   CALCIUM 9.3 12/02/2015 1104   ALKPHOS 117 02/14/2022 1123   ALKPHOS 143 (H) 03/28/2017 1312   ALKPHOS 151 (H) 09/20/2016 1131   ALKPHOS 135 12/02/2015 1104   AST 17 02/14/2022 1123   AST 19 12/02/2015 1104   ALT 20 02/14/2022 1123   ALT 24 09/20/2016 1131   ALT 23 12/02/2015 1104   BILITOT 0.4 02/14/2022 1123   BILITOT <0.30 12/02/2015 1104       Impression and Plan: Samantha Maynard is a very pleasant 62 yo caucasian female with history of recurrent pulmonary emboli on lifelong anticoagulation with Xarelto managed by her PCP Dr. Eula Listen.   Has had an elevated hemoglobin level for the past 2 years. Certainly could be secondary to her sleep apnea but her last sleep study was 2 years ago and she has only lost weight since this time. No  evidence of COPD and she is a non-smoker. We will plan for a 1 year follow up and do PV labs at that time. Fortunately she is already on Xarelto.   RTC 6 months APP/MD, labs ( CBC w/, CMP, iron, ferritin ,retic, LDH, BCR-ABLE fish, Jak2 labs.     Rushie Chestnut, PA-C 9/30/20249:51 AM

## 2023-02-24 DIAGNOSIS — R350 Frequency of micturition: Secondary | ICD-10-CM | POA: Diagnosis not present

## 2023-02-24 DIAGNOSIS — R300 Dysuria: Secondary | ICD-10-CM | POA: Diagnosis not present

## 2023-02-24 DIAGNOSIS — N390 Urinary tract infection, site not specified: Secondary | ICD-10-CM | POA: Diagnosis not present

## 2023-02-24 DIAGNOSIS — N3001 Acute cystitis with hematuria: Secondary | ICD-10-CM | POA: Diagnosis not present

## 2023-02-24 DIAGNOSIS — R8290 Unspecified abnormal findings in urine: Secondary | ICD-10-CM | POA: Diagnosis not present

## 2023-02-27 DIAGNOSIS — G4733 Obstructive sleep apnea (adult) (pediatric): Secondary | ICD-10-CM | POA: Diagnosis not present

## 2023-03-04 ENCOUNTER — Other Ambulatory Visit: Payer: Self-pay | Admitting: Internal Medicine

## 2023-03-04 ENCOUNTER — Ambulatory Visit
Admission: RE | Admit: 2023-03-04 | Discharge: 2023-03-04 | Disposition: A | Discharge status: Home / Self Care | Payer: Federal, State, Local not specified - PPO | Source: Ambulatory Visit | Attending: Internal Medicine | Admitting: Internal Medicine

## 2023-03-04 DIAGNOSIS — I10 Essential (primary) hypertension: Secondary | ICD-10-CM | POA: Diagnosis not present

## 2023-03-04 DIAGNOSIS — F331 Major depressive disorder, recurrent, moderate: Secondary | ICD-10-CM | POA: Diagnosis not present

## 2023-03-04 DIAGNOSIS — Z23 Encounter for immunization: Secondary | ICD-10-CM | POA: Diagnosis not present

## 2023-03-04 DIAGNOSIS — G4733 Obstructive sleep apnea (adult) (pediatric): Secondary | ICD-10-CM | POA: Diagnosis not present

## 2023-03-04 DIAGNOSIS — M5432 Sciatica, left side: Secondary | ICD-10-CM

## 2023-03-04 DIAGNOSIS — M549 Dorsalgia, unspecified: Secondary | ICD-10-CM | POA: Diagnosis not present

## 2023-03-04 DIAGNOSIS — E1169 Type 2 diabetes mellitus with other specified complication: Secondary | ICD-10-CM | POA: Diagnosis not present

## 2023-03-21 DIAGNOSIS — M25552 Pain in left hip: Secondary | ICD-10-CM | POA: Diagnosis not present

## 2023-03-21 DIAGNOSIS — M25652 Stiffness of left hip, not elsewhere classified: Secondary | ICD-10-CM | POA: Diagnosis not present

## 2023-03-21 DIAGNOSIS — M6281 Muscle weakness (generalized): Secondary | ICD-10-CM | POA: Diagnosis not present

## 2023-03-21 DIAGNOSIS — M545 Low back pain, unspecified: Secondary | ICD-10-CM | POA: Diagnosis not present

## 2023-03-25 DIAGNOSIS — M25552 Pain in left hip: Secondary | ICD-10-CM | POA: Diagnosis not present

## 2023-03-25 DIAGNOSIS — M25652 Stiffness of left hip, not elsewhere classified: Secondary | ICD-10-CM | POA: Diagnosis not present

## 2023-03-25 DIAGNOSIS — M545 Low back pain, unspecified: Secondary | ICD-10-CM | POA: Diagnosis not present

## 2023-03-25 DIAGNOSIS — M6281 Muscle weakness (generalized): Secondary | ICD-10-CM | POA: Diagnosis not present

## 2023-03-28 DIAGNOSIS — M545 Low back pain, unspecified: Secondary | ICD-10-CM | POA: Diagnosis not present

## 2023-03-28 DIAGNOSIS — M25652 Stiffness of left hip, not elsewhere classified: Secondary | ICD-10-CM | POA: Diagnosis not present

## 2023-03-28 DIAGNOSIS — M6281 Muscle weakness (generalized): Secondary | ICD-10-CM | POA: Diagnosis not present

## 2023-03-28 DIAGNOSIS — M25552 Pain in left hip: Secondary | ICD-10-CM | POA: Diagnosis not present

## 2023-03-30 DIAGNOSIS — G4733 Obstructive sleep apnea (adult) (pediatric): Secondary | ICD-10-CM | POA: Diagnosis not present

## 2023-04-01 DIAGNOSIS — M545 Low back pain, unspecified: Secondary | ICD-10-CM | POA: Diagnosis not present

## 2023-04-01 DIAGNOSIS — M25552 Pain in left hip: Secondary | ICD-10-CM | POA: Diagnosis not present

## 2023-04-01 DIAGNOSIS — M25652 Stiffness of left hip, not elsewhere classified: Secondary | ICD-10-CM | POA: Diagnosis not present

## 2023-04-01 DIAGNOSIS — M6281 Muscle weakness (generalized): Secondary | ICD-10-CM | POA: Diagnosis not present

## 2023-04-04 DIAGNOSIS — M6281 Muscle weakness (generalized): Secondary | ICD-10-CM | POA: Diagnosis not present

## 2023-04-04 DIAGNOSIS — M25652 Stiffness of left hip, not elsewhere classified: Secondary | ICD-10-CM | POA: Diagnosis not present

## 2023-04-04 DIAGNOSIS — M545 Low back pain, unspecified: Secondary | ICD-10-CM | POA: Diagnosis not present

## 2023-04-04 DIAGNOSIS — M25552 Pain in left hip: Secondary | ICD-10-CM | POA: Diagnosis not present

## 2023-04-08 DIAGNOSIS — M6281 Muscle weakness (generalized): Secondary | ICD-10-CM | POA: Diagnosis not present

## 2023-04-08 DIAGNOSIS — M25552 Pain in left hip: Secondary | ICD-10-CM | POA: Diagnosis not present

## 2023-04-08 DIAGNOSIS — M25652 Stiffness of left hip, not elsewhere classified: Secondary | ICD-10-CM | POA: Diagnosis not present

## 2023-04-08 DIAGNOSIS — M545 Low back pain, unspecified: Secondary | ICD-10-CM | POA: Diagnosis not present

## 2023-04-11 DIAGNOSIS — M25552 Pain in left hip: Secondary | ICD-10-CM | POA: Diagnosis not present

## 2023-04-11 DIAGNOSIS — M6281 Muscle weakness (generalized): Secondary | ICD-10-CM | POA: Diagnosis not present

## 2023-04-11 DIAGNOSIS — M25652 Stiffness of left hip, not elsewhere classified: Secondary | ICD-10-CM | POA: Diagnosis not present

## 2023-04-11 DIAGNOSIS — M545 Low back pain, unspecified: Secondary | ICD-10-CM | POA: Diagnosis not present

## 2023-04-15 DIAGNOSIS — M545 Low back pain, unspecified: Secondary | ICD-10-CM | POA: Diagnosis not present

## 2023-04-15 DIAGNOSIS — M6281 Muscle weakness (generalized): Secondary | ICD-10-CM | POA: Diagnosis not present

## 2023-04-15 DIAGNOSIS — M25652 Stiffness of left hip, not elsewhere classified: Secondary | ICD-10-CM | POA: Diagnosis not present

## 2023-04-15 DIAGNOSIS — M25552 Pain in left hip: Secondary | ICD-10-CM | POA: Diagnosis not present

## 2023-04-22 DIAGNOSIS — M6281 Muscle weakness (generalized): Secondary | ICD-10-CM | POA: Diagnosis not present

## 2023-04-22 DIAGNOSIS — M25552 Pain in left hip: Secondary | ICD-10-CM | POA: Diagnosis not present

## 2023-04-22 DIAGNOSIS — M25652 Stiffness of left hip, not elsewhere classified: Secondary | ICD-10-CM | POA: Diagnosis not present

## 2023-04-22 DIAGNOSIS — M545 Low back pain, unspecified: Secondary | ICD-10-CM | POA: Diagnosis not present

## 2023-04-25 DIAGNOSIS — M545 Low back pain, unspecified: Secondary | ICD-10-CM | POA: Diagnosis not present

## 2023-04-25 DIAGNOSIS — M25552 Pain in left hip: Secondary | ICD-10-CM | POA: Diagnosis not present

## 2023-04-25 DIAGNOSIS — M25652 Stiffness of left hip, not elsewhere classified: Secondary | ICD-10-CM | POA: Diagnosis not present

## 2023-04-25 DIAGNOSIS — M6281 Muscle weakness (generalized): Secondary | ICD-10-CM | POA: Diagnosis not present

## 2023-04-29 DIAGNOSIS — A499 Bacterial infection, unspecified: Secondary | ICD-10-CM | POA: Diagnosis not present

## 2023-04-29 DIAGNOSIS — M545 Low back pain, unspecified: Secondary | ICD-10-CM | POA: Diagnosis not present

## 2023-04-29 DIAGNOSIS — N3001 Acute cystitis with hematuria: Secondary | ICD-10-CM | POA: Diagnosis not present

## 2023-04-29 DIAGNOSIS — R300 Dysuria: Secondary | ICD-10-CM | POA: Diagnosis not present

## 2023-04-29 DIAGNOSIS — M25552 Pain in left hip: Secondary | ICD-10-CM | POA: Diagnosis not present

## 2023-04-29 DIAGNOSIS — M6281 Muscle weakness (generalized): Secondary | ICD-10-CM | POA: Diagnosis not present

## 2023-04-29 DIAGNOSIS — R350 Frequency of micturition: Secondary | ICD-10-CM | POA: Diagnosis not present

## 2023-04-29 DIAGNOSIS — N390 Urinary tract infection, site not specified: Secondary | ICD-10-CM | POA: Diagnosis not present

## 2023-04-29 DIAGNOSIS — M25652 Stiffness of left hip, not elsewhere classified: Secondary | ICD-10-CM | POA: Diagnosis not present

## 2023-05-02 DIAGNOSIS — M6281 Muscle weakness (generalized): Secondary | ICD-10-CM | POA: Diagnosis not present

## 2023-05-02 DIAGNOSIS — M25552 Pain in left hip: Secondary | ICD-10-CM | POA: Diagnosis not present

## 2023-05-02 DIAGNOSIS — M25652 Stiffness of left hip, not elsewhere classified: Secondary | ICD-10-CM | POA: Diagnosis not present

## 2023-05-02 DIAGNOSIS — M545 Low back pain, unspecified: Secondary | ICD-10-CM | POA: Diagnosis not present

## 2023-05-06 DIAGNOSIS — F322 Major depressive disorder, single episode, severe without psychotic features: Secondary | ICD-10-CM | POA: Diagnosis not present

## 2023-05-28 DIAGNOSIS — M25652 Stiffness of left hip, not elsewhere classified: Secondary | ICD-10-CM | POA: Diagnosis not present

## 2023-05-28 DIAGNOSIS — M25552 Pain in left hip: Secondary | ICD-10-CM | POA: Diagnosis not present

## 2023-05-28 DIAGNOSIS — M6281 Muscle weakness (generalized): Secondary | ICD-10-CM | POA: Diagnosis not present

## 2023-05-28 DIAGNOSIS — M545 Low back pain, unspecified: Secondary | ICD-10-CM | POA: Diagnosis not present

## 2023-06-04 DIAGNOSIS — G4733 Obstructive sleep apnea (adult) (pediatric): Secondary | ICD-10-CM | POA: Diagnosis not present

## 2023-06-05 DIAGNOSIS — G4733 Obstructive sleep apnea (adult) (pediatric): Secondary | ICD-10-CM | POA: Diagnosis not present

## 2023-06-12 DIAGNOSIS — I10 Essential (primary) hypertension: Secondary | ICD-10-CM | POA: Diagnosis not present

## 2023-06-12 DIAGNOSIS — E1169 Type 2 diabetes mellitus with other specified complication: Secondary | ICD-10-CM | POA: Diagnosis not present

## 2023-06-12 DIAGNOSIS — Z0000 Encounter for general adult medical examination without abnormal findings: Secondary | ICD-10-CM | POA: Diagnosis not present

## 2023-06-25 DIAGNOSIS — M545 Low back pain, unspecified: Secondary | ICD-10-CM | POA: Diagnosis not present

## 2023-06-25 DIAGNOSIS — M25552 Pain in left hip: Secondary | ICD-10-CM | POA: Diagnosis not present

## 2023-06-25 DIAGNOSIS — M25652 Stiffness of left hip, not elsewhere classified: Secondary | ICD-10-CM | POA: Diagnosis not present

## 2023-06-25 DIAGNOSIS — M6281 Muscle weakness (generalized): Secondary | ICD-10-CM | POA: Diagnosis not present

## 2023-07-05 DIAGNOSIS — G4733 Obstructive sleep apnea (adult) (pediatric): Secondary | ICD-10-CM | POA: Diagnosis not present

## 2023-08-02 DIAGNOSIS — G4733 Obstructive sleep apnea (adult) (pediatric): Secondary | ICD-10-CM | POA: Diagnosis not present

## 2023-08-08 DIAGNOSIS — F322 Major depressive disorder, single episode, severe without psychotic features: Secondary | ICD-10-CM | POA: Diagnosis not present

## 2023-09-05 DIAGNOSIS — B9689 Other specified bacterial agents as the cause of diseases classified elsewhere: Secondary | ICD-10-CM | POA: Diagnosis not present

## 2023-09-05 DIAGNOSIS — N390 Urinary tract infection, site not specified: Secondary | ICD-10-CM | POA: Diagnosis not present

## 2023-09-05 DIAGNOSIS — A499 Bacterial infection, unspecified: Secondary | ICD-10-CM | POA: Diagnosis not present

## 2023-10-11 DIAGNOSIS — N390 Urinary tract infection, site not specified: Secondary | ICD-10-CM | POA: Diagnosis not present

## 2023-10-11 DIAGNOSIS — A499 Bacterial infection, unspecified: Secondary | ICD-10-CM | POA: Diagnosis not present

## 2023-10-11 DIAGNOSIS — R350 Frequency of micturition: Secondary | ICD-10-CM | POA: Diagnosis not present

## 2023-10-11 DIAGNOSIS — R81 Glycosuria: Secondary | ICD-10-CM | POA: Diagnosis not present

## 2023-10-29 ENCOUNTER — Other Ambulatory Visit: Payer: Self-pay | Admitting: Obstetrics & Gynecology

## 2023-10-29 DIAGNOSIS — Z1231 Encounter for screening mammogram for malignant neoplasm of breast: Secondary | ICD-10-CM

## 2023-10-31 DIAGNOSIS — F322 Major depressive disorder, single episode, severe without psychotic features: Secondary | ICD-10-CM | POA: Diagnosis not present

## 2023-11-09 DIAGNOSIS — R300 Dysuria: Secondary | ICD-10-CM | POA: Diagnosis not present

## 2023-11-09 DIAGNOSIS — A499 Bacterial infection, unspecified: Secondary | ICD-10-CM | POA: Diagnosis not present

## 2023-11-09 DIAGNOSIS — N390 Urinary tract infection, site not specified: Secondary | ICD-10-CM | POA: Diagnosis not present

## 2023-12-09 ENCOUNTER — Inpatient Hospital Stay: Attending: Hematology & Oncology

## 2023-12-09 ENCOUNTER — Encounter: Payer: Self-pay | Admitting: Medical Oncology

## 2023-12-09 ENCOUNTER — Inpatient Hospital Stay: Admitting: Medical Oncology

## 2023-12-09 VITALS — BP 133/87 | HR 102 | Temp 98.7°F | Resp 18 | Ht 62.0 in | Wt 192.8 lb

## 2023-12-09 DIAGNOSIS — I10 Essential (primary) hypertension: Secondary | ICD-10-CM | POA: Diagnosis not present

## 2023-12-09 DIAGNOSIS — D582 Other hemoglobinopathies: Secondary | ICD-10-CM

## 2023-12-09 DIAGNOSIS — R634 Abnormal weight loss: Secondary | ICD-10-CM | POA: Insufficient documentation

## 2023-12-09 DIAGNOSIS — G4730 Sleep apnea, unspecified: Secondary | ICD-10-CM | POA: Diagnosis not present

## 2023-12-09 DIAGNOSIS — F331 Major depressive disorder, recurrent, moderate: Secondary | ICD-10-CM | POA: Diagnosis not present

## 2023-12-09 DIAGNOSIS — Z7901 Long term (current) use of anticoagulants: Secondary | ICD-10-CM | POA: Diagnosis not present

## 2023-12-09 DIAGNOSIS — K909 Intestinal malabsorption, unspecified: Secondary | ICD-10-CM

## 2023-12-09 DIAGNOSIS — D509 Iron deficiency anemia, unspecified: Secondary | ICD-10-CM

## 2023-12-09 DIAGNOSIS — E1169 Type 2 diabetes mellitus with other specified complication: Secondary | ICD-10-CM | POA: Diagnosis not present

## 2023-12-09 DIAGNOSIS — I2699 Other pulmonary embolism without acute cor pulmonale: Secondary | ICD-10-CM | POA: Insufficient documentation

## 2023-12-09 DIAGNOSIS — G4733 Obstructive sleep apnea (adult) (pediatric): Secondary | ICD-10-CM | POA: Diagnosis not present

## 2023-12-09 LAB — CMP (CANCER CENTER ONLY)
ALT: 22 U/L (ref 0–44)
AST: 18 U/L (ref 15–41)
Albumin: 4.7 g/dL (ref 3.5–5.0)
Alkaline Phosphatase: 100 U/L (ref 38–126)
Anion gap: 11 (ref 5–15)
BUN: 18 mg/dL (ref 8–23)
CO2: 25 mmol/L (ref 22–32)
Calcium: 10.1 mg/dL (ref 8.9–10.3)
Chloride: 101 mmol/L (ref 98–111)
Creatinine: 0.97 mg/dL (ref 0.44–1.00)
GFR, Estimated: >60 mL/min (ref >60)
Glucose, Bld: 113 mg/dL — ABNORMAL HIGH (ref 70–99)
Potassium: 3.9 mmol/L (ref 3.5–5.1)
Sodium: 137 mmol/L (ref 135–145)
Total Bilirubin: 0.4 mg/dL (ref 0.0–1.2)
Total Protein: 7.5 g/dL (ref 6.5–8.1)

## 2023-12-09 LAB — CBC WITH DIFFERENTIAL (CANCER CENTER ONLY)
Abs Immature Granulocytes: 0.1 K/uL — ABNORMAL HIGH (ref 0.00–0.07)
Basophils Absolute: 0 K/uL (ref 0.0–0.1)
Basophils Relative: 0 %
Eosinophils Absolute: 0 K/uL (ref 0.0–0.5)
Eosinophils Relative: 0 %
HCT: 49.9 % — ABNORMAL HIGH (ref 36.0–46.0)
Hemoglobin: 17 g/dL — ABNORMAL HIGH (ref 12.0–15.0)
Immature Granulocytes: 1 %
Lymphocytes Relative: 26 %
Lymphs Abs: 2.2 K/uL (ref 0.7–4.0)
MCH: 29.6 pg (ref 26.0–34.0)
MCHC: 34.1 g/dL (ref 30.0–36.0)
MCV: 86.9 fL (ref 80.0–100.0)
Monocytes Absolute: 0.6 K/uL (ref 0.1–1.0)
Monocytes Relative: 7 %
Neutro Abs: 5.5 K/uL (ref 1.7–7.7)
Neutrophils Relative %: 66 %
Platelet Count: 261 K/uL (ref 150–400)
RBC: 5.74 MIL/uL — ABNORMAL HIGH (ref 3.87–5.11)
RDW: 12.8 % (ref 11.5–15.5)
WBC Count: 8.4 K/uL (ref 4.0–10.5)
nRBC: 0 % (ref 0.0–0.2)

## 2023-12-09 LAB — FERRITIN: Ferritin: 76 ng/mL (ref 11–307)

## 2023-12-09 LAB — LACTATE DEHYDROGENASE: LDH: 181 U/L (ref 98–192)

## 2023-12-09 LAB — RETIC PANEL
Immature Retic Fract: 5.6 % (ref 2.3–15.9)
RBC.: 5.72 MIL/uL — ABNORMAL HIGH (ref 3.87–5.11)
Retic Count, Absolute: 91.5 K/uL (ref 19.0–186.0)
Retic Ct Pct: 1.6 % (ref 0.4–3.1)
Reticulocyte Hemoglobin: 34 pg (ref >27.9)

## 2023-12-09 NOTE — Progress Notes (Signed)
 Hematology and Oncology Follow Up Visit  Samantha Maynard [id number] [birth year] 63 y.o. 12/09/2023   Principle Diagnosis:  Iron deficiency anemia Recurrent pulmonary emboli   Current Therapy:         IV iron as indicated- Feraheme  PRN- last dose on 03/05/2022 Xarelto 20 mg PO daily - Lifelong, managed by Dr. Lunette   Interim History:  [name] is here today for follow-up.    She reports that she has been ok over. Some fatigue.   She is on Xarelto. There has been no bleeding to her knowledge: denies epistaxis, gingivitis, hemoptysis, hematemesis, hematuria, melena, excessive bruising, blood donation.   There is no change in bowel or bladder habits.  She is a non-smoker. She has sleep apnea and uses her machine nightly. She is not on any hormonal medication.   She is UTD on her mammogram done on 12/05/2022. BI-RADS 1  Her iron studies that were done last year in September showed a ferritin of 11 with an iron saturation 13%.  Has had intentional weight loss with Ozempic.   Wt Readings from Last 3 Encounters:  12/09/23 192 lb 12.8 oz (87.5 kg)  02/18/23 202 lb (91.6 kg)  02/14/22 206 lb (93.4 kg)    ECOG Performance Status: 1 - Symptomatic but completely ambulatory  Medications:  Allergies as of 12/09/2023   No Known Allergies      Medication List        Accurate as of December 09, 2023  3:36 PM. If you have any questions, ask your nurse or doctor.          STOP taking these medications    Jardiance 25 MG Tabs tablet Generic drug: empagliflozin Stopped by: Lauraine CHRISTELLA Dais   metFORMIN  850 MG tablet Commonly known as: GLUCOPHAGE  Stopped by: Lauraine CHRISTELLA Dais   saccharomyces boulardii 250 MG capsule Commonly known as: FLORASTOR Stopped by: Lauraine CHRISTELLA Dais       TAKE these medications    acetaminophen  325 MG tablet Commonly known as: TYLENOL  Take 325-650 mg by mouth every 6 (six) hours as needed for mild pain or moderate pain.   ALIGN PO Take 2  capsules by mouth daily.   cholecalciferol 25 MCG (1000 UNIT) tablet Commonly known as: VITAMIN D3 Take 1,000 Units by mouth daily.   clonazePAM  1 MG tablet Commonly known as: KLONOPIN  Take 1 mg by mouth at bedtime as needed (sleep).   ezetimibe  10 MG tablet Commonly known as: ZETIA  Take 1 tablet by mouth once daily   lisdexamfetamine 70 MG capsule Commonly known as: VYVANSE Take 70 mg by mouth daily.   loratadine 10 MG tablet Commonly known as: CLARITIN Take 10 mg by mouth daily.   losartan 25 MG tablet Commonly known as: COZAAR Take 25 mg by mouth daily.   multivitamin with minerals Tabs tablet Take 1 tablet by mouth daily.   nystatin powder Generic drug: nystatin SMARTSIG:Packet(s) Topical Twice Daily PRN   omeprazole  20 MG capsule Commonly known as: PRILOSEC Take 1 capsule (20 mg total) by mouth daily.   Ozempic (1 MG/DOSE) 4 MG/3ML Sopn Generic drug: Semaglutide (1 MG/DOSE) Inject 1 mg into the skin once a week. What changed: Another medication with the same name was removed. Continue taking this medication, and follow the directions you see here. Changed by: Lauraine CHRISTELLA Dais   rivaroxaban 20 MG Tabs tablet Commonly known as: XARELTO Take 20 mg by mouth daily with supper.   senna 8.6 MG tablet Commonly known as:  SENOKOT Take 2 tablets by mouth daily.   venlafaxine  XR 150 MG 24 hr capsule Commonly known as: EFFEXOR -XR Take 150 mg by mouth daily at 6 PM.   venlafaxine  XR 75 MG 24 hr capsule Commonly known as: EFFEXOR -XR Take 75 mg by mouth daily at 6 PM.        Allergies: No Known Allergies  Past Medical History, Surgical history, Social history, and Family History were reviewed and updated.  Review of Systems: All other 10 point review of systems is negative.   Physical Exam:  height is 5' 2 (1.575 m) and weight is 192 lb 12.8 oz (87.5 kg). Her oral temperature is 98.7 F (37.1 C). Her blood pressure is 133/87 and her pulse is 102  (abnormal). Her respiration is 18 and oxygen saturation is 98%.   Wt Readings from Last 3 Encounters:  12/09/23 192 lb 12.8 oz (87.5 kg)  02/18/23 202 lb (91.6 kg)  02/14/22 206 lb (93.4 kg)    Ocular: Sclerae unicteric, pupils equal, round and reactive to light Ear-nose-throat: Oropharynx clear, dentition fair Lymphatic: No cervical or supraclavicular adenopathy Lungs no rales or rhonchi, good excursion bilaterally Heart regular rate and rhythm, no murmur appreciated Abd soft, nontender, positive bowel sounds MSK no focal spinal tenderness, no joint edema Neuro: non-focal, well-oriented, appropriate affect  Lab Results  Component Value Date   WBC 8.4 12/09/2023   HGB 17.0 (H) 12/09/2023   HCT 49.9 (H) 12/09/2023   MCV 86.9 12/09/2023   PLT 261 12/09/2023   Lab Results  Component Value Date   FERRITIN 69 02/18/2023   IRON 95 02/18/2023   TIBC 323 02/18/2023   UIBC 228 02/18/2023   IRONPCTSAT 29 02/18/2023   Lab Results  Component Value Date   RETICCTPCT 1.6 12/09/2023   RBC 5.72 (H) 12/09/2023   RBC 5.74 (H) 12/09/2023   RETICCTABS 60.6 03/25/2014   No results found for: KPAFRELGTCHN, LAMBDASER, KAPLAMBRATIO No results found for: [place] LE, IGMSERUM No results found for: STEPHANY CARLOTA BENSON MARKEL EARLA JOANNIE DOC VICK, SPEI   Chemistry      Component Value Date/Time   NA 137 12/09/2023 1444   NA 138 03/28/2017 1312   NA 142 09/20/2016 1131   NA 139 12/02/2015 1104   K 3.9 12/09/2023 1444   K 4.3 03/28/2017 1312   K 3.9 09/20/2016 1131   K 4.1 12/02/2015 1104   CL 101 12/09/2023 1444   CL 100 03/28/2017 1312   CL 102 09/20/2016 1131   CO2 25 12/09/2023 1444   CO2 27 03/28/2017 1312   CO2 27 09/20/2016 1131   CO2 25 12/02/2015 1104   BUN 18 12/09/2023 1444   BUN 10 03/28/2017 1312   BUN 10 09/20/2016 1131   BUN 17.5 12/02/2015 1104   CREATININE 0.97 12/09/2023 1444   CREATININE 0.80 03/28/2017 1312    CREATININE 0.8 09/20/2016 1131   CREATININE 0.9 12/02/2015 1104      Component Value Date/Time   CALCIUM 10.1 12/09/2023 1444   CALCIUM 9.8 03/28/2017 1312   CALCIUM 9.2 09/20/2016 1131   CALCIUM 9.3 12/02/2015 1104   ALKPHOS 100 12/09/2023 1444   ALKPHOS 143 (H) 03/28/2017 1312   ALKPHOS 151 (H) 09/20/2016 1131   ALKPHOS 135 12/02/2015 1104   AST 18 12/09/2023 1444   AST 19 12/02/2015 1104   ALT 22 12/09/2023 1444   ALT 24 09/20/2016 1131   ALT 23 12/02/2015 1104   BILITOT 0.4 12/09/2023 1444   BILITOT <0.30  12/02/2015 1104     No diagnosis found.  Impression and Plan: Ms. Pfefferkorn is a very pleasant 63 yo caucasian female with history of recurrent pulmonary emboli on lifelong anticoagulation with Xarelto managed by her PCP Dr. Hussain.   Has had an elevated hemoglobin level for the past 2 years. Certainly could be secondary to her sleep apnea but her last sleep study was 2 years ago and she has only lost weight since this time. Fortunately she is already on Xarelto.   Today Hgb is 17 which is stable for her.  Myeloid panel pending  CBC otherwise unremarkable CMP unremarkable   RTC 6 months APP/MD, labs ( CBC w/, CMP, iron, ferritin ,retic, LDH, BCR-ABL)     Lauraine CHRISTELLA Dais, PA-C 7/21/20253:36 PM

## 2023-12-10 LAB — IRON AND IRON BINDING CAPACITY (CC-WL,HP ONLY)
Iron: 70 ug/dL (ref 28–170)
Saturation Ratios: 23 % (ref 10.4–31.8)
TIBC: 305 ug/dL (ref 250–450)
UIBC: 235 ug/dL (ref 148–442)

## 2023-12-10 LAB — INTELLIGEN MYELOID

## 2023-12-11 ENCOUNTER — Other Ambulatory Visit: Payer: Self-pay | Admitting: Medical Genetics

## 2023-12-17 ENCOUNTER — Ambulatory Visit
Admission: RE | Admit: 2023-12-17 | Discharge: 2023-12-17 | Disposition: A | Discharge status: Home / Self Care | Source: Ambulatory Visit | Attending: Obstetrics & Gynecology | Admitting: Obstetrics & Gynecology

## 2023-12-17 DIAGNOSIS — Z1231 Encounter for screening mammogram for malignant neoplasm of breast: Secondary | ICD-10-CM

## 2023-12-17 DIAGNOSIS — G4733 Obstructive sleep apnea (adult) (pediatric): Secondary | ICD-10-CM | POA: Diagnosis not present

## 2023-12-17 DIAGNOSIS — R92343 Mammographic extreme density, bilateral breasts: Secondary | ICD-10-CM | POA: Diagnosis not present

## 2023-12-23 LAB — MISC LABCORP TEST (SEND OUT): Labcorp test code: 452312

## 2023-12-24 ENCOUNTER — Other Ambulatory Visit (HOSPITAL_COMMUNITY)
Admission: RE | Admit: 2023-12-24 | Discharge: 2023-12-24 | Disposition: A | Discharge status: Home / Self Care | Source: Ambulatory Visit | Attending: Medical Genetics | Admitting: Medical Genetics

## 2024-01-01 DIAGNOSIS — H25813 Combined forms of age-related cataract, bilateral: Secondary | ICD-10-CM | POA: Diagnosis not present

## 2024-01-01 DIAGNOSIS — E119 Type 2 diabetes mellitus without complications: Secondary | ICD-10-CM | POA: Diagnosis not present

## 2024-01-03 LAB — GENECONNECT MOLECULAR SCREEN: Genetic Analysis Overall Interpretation: NEGATIVE

## 2024-01-07 DIAGNOSIS — G4733 Obstructive sleep apnea (adult) (pediatric): Secondary | ICD-10-CM | POA: Diagnosis not present

## 2024-01-14 DIAGNOSIS — Z124 Encounter for screening for malignant neoplasm of cervix: Secondary | ICD-10-CM | POA: Diagnosis not present

## 2024-01-14 DIAGNOSIS — Z86711 Personal history of pulmonary embolism: Secondary | ICD-10-CM | POA: Diagnosis not present

## 2024-01-14 DIAGNOSIS — Z01419 Encounter for gynecological examination (general) (routine) without abnormal findings: Secondary | ICD-10-CM | POA: Diagnosis not present

## 2024-01-14 DIAGNOSIS — L900 Lichen sclerosus et atrophicus: Secondary | ICD-10-CM | POA: Diagnosis not present

## 2024-01-14 DIAGNOSIS — Z1151 Encounter for screening for human papillomavirus (HPV): Secondary | ICD-10-CM | POA: Diagnosis not present

## 2024-01-17 DIAGNOSIS — G4733 Obstructive sleep apnea (adult) (pediatric): Secondary | ICD-10-CM | POA: Diagnosis not present

## 2024-01-27 DIAGNOSIS — F322 Major depressive disorder, single episode, severe without psychotic features: Secondary | ICD-10-CM | POA: Diagnosis not present

## 2024-02-17 DIAGNOSIS — G4733 Obstructive sleep apnea (adult) (pediatric): Secondary | ICD-10-CM | POA: Diagnosis not present

## 2024-04-14 DIAGNOSIS — A499 Bacterial infection, unspecified: Secondary | ICD-10-CM | POA: Diagnosis not present

## 2024-04-14 DIAGNOSIS — N390 Urinary tract infection, site not specified: Secondary | ICD-10-CM | POA: Diagnosis not present

## 2024-04-21 DIAGNOSIS — F322 Major depressive disorder, single episode, severe without psychotic features: Secondary | ICD-10-CM | POA: Diagnosis not present

## 2024-04-23 DIAGNOSIS — G4733 Obstructive sleep apnea (adult) (pediatric): Secondary | ICD-10-CM | POA: Diagnosis not present

## 2024-06-10 ENCOUNTER — Inpatient Hospital Stay: Attending: Medical Oncology

## 2024-06-10 ENCOUNTER — Inpatient Hospital Stay: Admitting: Family

## 2024-06-10 VITALS — BP 137/81 | HR 80 | Temp 97.8°F | Resp 19 | Ht 62.0 in | Wt 196.4 lb

## 2024-06-10 DIAGNOSIS — D582 Other hemoglobinopathies: Secondary | ICD-10-CM

## 2024-06-10 DIAGNOSIS — D509 Iron deficiency anemia, unspecified: Secondary | ICD-10-CM

## 2024-06-10 DIAGNOSIS — K909 Intestinal malabsorption, unspecified: Secondary | ICD-10-CM

## 2024-06-10 LAB — CBC WITH DIFFERENTIAL (CANCER CENTER ONLY)
Abs Immature Granulocytes: 0.1 K/uL — ABNORMAL HIGH (ref 0.00–0.07)
Basophils Absolute: 0 K/uL (ref 0.0–0.1)
Basophils Relative: 0 %
Eosinophils Absolute: 0 K/uL (ref 0.0–0.5)
Eosinophils Relative: 0 %
HCT: 48.5 % — ABNORMAL HIGH (ref 36.0–46.0)
Hemoglobin: 16.4 g/dL — ABNORMAL HIGH (ref 12.0–15.0)
Immature Granulocytes: 1 %
Lymphocytes Relative: 24 %
Lymphs Abs: 1.8 K/uL (ref 0.7–4.0)
MCH: 29.8 pg (ref 26.0–34.0)
MCHC: 33.8 g/dL (ref 30.0–36.0)
MCV: 88.2 fL (ref 80.0–100.0)
Monocytes Absolute: 0.6 K/uL (ref 0.1–1.0)
Monocytes Relative: 8 %
Neutro Abs: 4.8 K/uL (ref 1.7–7.7)
Neutrophils Relative %: 67 %
Platelet Count: 254 K/uL (ref 150–400)
RBC: 5.5 MIL/uL — ABNORMAL HIGH (ref 3.87–5.11)
RDW: 12.8 % (ref 11.5–15.5)
WBC Count: 7.3 K/uL (ref 4.0–10.5)
nRBC: 0 % (ref 0.0–0.2)

## 2024-06-10 LAB — CMP (CANCER CENTER ONLY)
ALT: 22 U/L (ref 0–44)
AST: 23 U/L (ref 15–41)
Albumin: 4.4 g/dL (ref 3.5–5.0)
Alkaline Phosphatase: 113 U/L (ref 38–126)
Anion gap: 11 (ref 5–15)
BUN: 16 mg/dL (ref 8–23)
CO2: 26 mmol/L (ref 22–32)
Calcium: 9.4 mg/dL (ref 8.9–10.3)
Chloride: 101 mmol/L (ref 98–111)
Creatinine: 0.89 mg/dL (ref 0.44–1.00)
GFR, Estimated: >60 mL/min
Glucose, Bld: 87 mg/dL (ref 70–99)
Potassium: 4.3 mmol/L (ref 3.5–5.1)
Sodium: 139 mmol/L (ref 135–145)
Total Bilirubin: 0.4 mg/dL (ref 0.0–1.2)
Total Protein: 7.5 g/dL (ref 6.5–8.1)

## 2024-06-10 LAB — IRON AND IRON BINDING CAPACITY (CC-WL,HP ONLY)
Iron: 94 ug/dL (ref 28–170)
Saturation Ratios: 29 % (ref 10.4–31.8)
TIBC: 321 ug/dL (ref 250–450)
UIBC: 226 ug/dL

## 2024-06-10 LAB — FERRITIN: Ferritin: 61 ng/mL (ref 11–307)

## 2024-06-10 LAB — RETIC PANEL
Immature Retic Fract: 8.1 % (ref 2.3–15.9)
RBC.: 5.45 MIL/uL — ABNORMAL HIGH (ref 3.87–5.11)
Retic Count, Absolute: 88.3 K/uL (ref 19.0–186.0)
Retic Ct Pct: 1.6 % (ref 0.4–3.1)
Reticulocyte Hemoglobin: 33.1 pg

## 2024-06-10 NOTE — Progress Notes (Signed)
 " Hematology and Oncology Follow Up Visit  Danyetta Gillham [id number] [birth year] 64 y.o. 06/10/2024   Principle Diagnosis:  Iron deficiency anemia Recurrent pulmonary emboli   Current Therapy:         IV iron as indicated Xarelto 20 mg PO daily - Lifelong, managed by Dr. Lunette               Interim History:  Ms. Kirstein is here today for follow-up. She is doing quite well and has no complaints at this time.  No issues with bleeding. No abnormal bruising or petechiae.  No fever, chills, n/v, cough, rash, dizziness, SOB, chest pain, palpitations, abdominal pain or changes in bowel or bladder habits.  No swelling, tenderness, numbness or tingling in here extremities.  No falls or syncope reported.  Appetite and hydration are good. Weight is stable at 196 lbs.   ECOG Performance Status: 0 - Asymptomatic  Medications:  Allergies as of 06/10/2024   No Known Allergies      Medication List        Accurate as of June 10, 2024 10:30 AM. If you have any questions, ask your nurse or doctor.          acetaminophen  325 MG tablet Commonly known as: TYLENOL  Take 325-650 mg by mouth every 6 (six) hours as needed for mild pain or moderate pain.   ALIGN PO Take 2 capsules by mouth daily.   cholecalciferol 25 MCG (1000 UNIT) tablet Commonly known as: VITAMIN D3 Take 1,000 Units by mouth daily.   clonazePAM  1 MG tablet Commonly known as: KLONOPIN  Take 1 mg by mouth at bedtime as needed (sleep).   ezetimibe  10 MG tablet Commonly known as: ZETIA  Take 1 tablet by mouth once daily   lisdexamfetamine 70 MG capsule Commonly known as: VYVANSE Take 70 mg by mouth daily.   loratadine 10 MG tablet Commonly known as: CLARITIN Take 10 mg by mouth daily.   losartan 25 MG tablet Commonly known as: COZAAR Take 25 mg by mouth daily.   multivitamin with minerals Tabs tablet Take 1 tablet by mouth daily.   nystatin powder Generic drug: nystatin SMARTSIG:Packet(s) Topical Twice  Daily PRN   omeprazole  20 MG capsule Commonly known as: PRILOSEC Take 1 capsule (20 mg total) by mouth daily.   Ozempic (1 MG/DOSE) 4 MG/3ML Sopn Generic drug: Semaglutide (1 MG/DOSE) Inject 1 mg into the skin once a week.   rivaroxaban 20 MG Tabs tablet Commonly known as: XARELTO Take 20 mg by mouth daily with supper.   senna 8.6 MG tablet Commonly known as: SENOKOT Take 2 tablets by mouth daily.   venlafaxine  XR 150 MG 24 hr capsule Commonly known as: EFFEXOR -XR Take 150 mg by mouth daily at 6 PM.   venlafaxine  XR 75 MG 24 hr capsule Commonly known as: EFFEXOR -XR Take 75 mg by mouth daily at 6 PM.        Allergies: Allergies[1]  Past Medical History, Surgical history, Social history, and Family History were reviewed and updated.  Review of Systems: All other 10 point review of systems is negative.   Physical Exam:  height is 5' 2 (1.575 m) and weight is 196 lb 6.4 oz (89.1 kg). Her oral temperature is 97.8 F (36.6 C). Her blood pressure is 137/81 and her pulse is 80. Her respiration is 19 and oxygen saturation is 97%.   Wt Readings from Last 3 Encounters:  06/10/24 196 lb 6.4 oz (89.1 kg)  12/09/23 192 lb 12.8  oz (87.5 kg)  02/18/23 202 lb (91.6 kg)    Ocular: Sclerae unicteric, pupils equal, round and reactive to light Ear-nose-throat: Oropharynx clear, dentition fair Lymphatic: No cervical or supraclavicular adenopathy Lungs no rales or rhonchi, good excursion bilaterally Heart regular rate and rhythm, no murmur appreciated Abd soft, nontender, positive bowel sounds MSK no focal spinal tenderness, no joint edema Neuro: non-focal, well-oriented, appropriate affect Breasts: Deferred   Lab Results  Component Value Date   WBC 7.3 06/10/2024   HGB 16.4 (H) 06/10/2024   HCT 48.5 (H) 06/10/2024   MCV 88.2 06/10/2024   PLT 254 06/10/2024   Lab Results  Component Value Date   FERRITIN 76 12/09/2023   IRON 70 12/09/2023   TIBC 305 12/09/2023   UIBC  235 12/09/2023   IRONPCTSAT 23 12/09/2023   Lab Results  Component Value Date   RETICCTPCT 1.6 06/10/2024   RBC 5.50 (H) 06/10/2024   RETICCTABS 60.6 03/25/2014   No results found for: KPAFRELGTCHN, LAMBDASER, KAPLAMBRATIO No results found for: [place] LE, IGMSERUM No results found for: STEPHANY CARLOTA BENSON MARKEL EARLA JOANNIE DOC VICK, SPEI   Chemistry      Component Value Date/Time   NA 137 12/09/2023 1444   NA 138 03/28/2017 1312   NA 142 09/20/2016 1131   NA 139 12/02/2015 1104   K 3.9 12/09/2023 1444   K 4.3 03/28/2017 1312   K 3.9 09/20/2016 1131   K 4.1 12/02/2015 1104   CL 101 12/09/2023 1444   CL 100 03/28/2017 1312   CL 102 09/20/2016 1131   CO2 25 12/09/2023 1444   CO2 27 03/28/2017 1312   CO2 27 09/20/2016 1131   CO2 25 12/02/2015 1104   BUN 18 12/09/2023 1444   BUN 10 03/28/2017 1312   BUN 10 09/20/2016 1131   BUN 17.5 12/02/2015 1104   CREATININE 0.97 12/09/2023 1444   CREATININE 0.80 03/28/2017 1312   CREATININE 0.8 09/20/2016 1131   CREATININE 0.9 12/02/2015 1104      Component Value Date/Time   CALCIUM 10.1 12/09/2023 1444   CALCIUM 9.8 03/28/2017 1312   CALCIUM 9.2 09/20/2016 1131   CALCIUM 9.3 12/02/2015 1104   ALKPHOS 100 12/09/2023 1444   ALKPHOS 143 (H) 03/28/2017 1312   ALKPHOS 151 (H) 09/20/2016 1131   ALKPHOS 135 12/02/2015 1104   AST 18 12/09/2023 1444   AST 19 12/02/2015 1104   ALT 22 12/09/2023 1444   ALT 24 09/20/2016 1131   ALT 23 12/02/2015 1104   BILITOT 0.4 12/09/2023 1444   BILITOT <0.30 12/02/2015 1104       Impression and Plan: Ms. Zwicker is a very pleasant 64 yo caucasian female with history of recurrent pulmonary emboli on lifelong anticoagulation with Xarelto managed by her PCP Dr. Lunette.  Iron studies pending. We will replace if needed.  She also has BCR/ABL/CML/ALL pending for erythrocytosis. [facility] in August was cancelled as they did not have the  reagent needed to run the test.  Follow-up in 6 months.   Lauraine Pepper, NP 1/21/202610:30 AM     [1] No Known Allergies  "

## 2024-06-17 LAB — BCR-ABL1, CML/ALL, PCR, QUANT
E1A2 Transcript: <0.0032 %
Interpretation (BCRAL):: NEGATIVE
b2a2 transcript: <0.0032 %
b3a2 transcript: <0.0032 %

## 2024-06-23 ENCOUNTER — Ambulatory Visit: Payer: Self-pay | Admitting: Medical Oncology

## 2024-12-09 ENCOUNTER — Inpatient Hospital Stay

## 2024-12-09 ENCOUNTER — Inpatient Hospital Stay: Admitting: Family
